# Patient Record
Sex: Female | Born: 1995 | Race: White | Hispanic: No | Smoking: Never smoker
Health system: Southern US, Community
[De-identification: ages and names within clinical notes are randomized; demographics above are authoritative.]

## PROBLEM LIST (undated history)

## (undated) DIAGNOSIS — I10 Essential (primary) hypertension: Secondary | ICD-10-CM

## (undated) DIAGNOSIS — G43711 Chronic migraine without aura, intractable, with status migrainosus: Secondary | ICD-10-CM

## (undated) HISTORY — PX: NO PAST SURGERIES: SHX2092

## (undated) HISTORY — DX: Chronic migraine without aura, intractable, with status migrainosus: G43.711

---
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
## 2017-03-27 ENCOUNTER — Encounter: Payer: Self-pay | Admitting: Physician Assistant

## 2017-03-27 ENCOUNTER — Ambulatory Visit (INDEPENDENT_AMBULATORY_CARE_PROVIDER_SITE_OTHER): Payer: Medicaid Other | Admitting: Physician Assistant

## 2017-03-27 VITALS — BP 138/88 | HR 84 | Temp 98.4°F | Resp 16 | Ht 70.0 in | Wt 210.0 lb

## 2017-03-27 DIAGNOSIS — Z7689 Persons encountering health services in other specified circumstances: Secondary | ICD-10-CM

## 2017-03-27 DIAGNOSIS — R519 Headache, unspecified: Secondary | ICD-10-CM

## 2017-03-27 DIAGNOSIS — Z23 Encounter for immunization: Secondary | ICD-10-CM

## 2017-03-27 DIAGNOSIS — Z3009 Encounter for other general counseling and advice on contraception: Secondary | ICD-10-CM

## 2017-03-27 DIAGNOSIS — R51 Headache: Secondary | ICD-10-CM

## 2017-03-27 DIAGNOSIS — R030 Elevated blood-pressure reading, without diagnosis of hypertension: Secondary | ICD-10-CM | POA: Diagnosis not present

## 2017-03-27 NOTE — Progress Notes (Addendum)
Patient: Brenda Osborn Female    DOB: [birth year]   21 y.o.   MRN: [id number] Visit Date: 03/27/2017  Today's Provider: Trinna Post, PA-C   Chief Complaint  Patient presents with  . Establish Care  . Headache    Happens about 5-7 days a week.  . Hypertension    Was on Lisinopril in the past.   Subjective:     Brenda Osborn is a 21 y/o woman presenting today to establish care. She was previously seen at [facility] by Dr. Jaynie Crumble.   She is currently at [place]. She lives with her mom, step dad, and two sisters in [place]. Her mother is borderline DM. No contact with birth father.  History of breast cancer in great aunt at age 34's-40's. No family history of colon cancer.   She is single. She is sexually active with single female partner. Uses condoms. Is on sprintec for contraception, has been since age 24. No history of DVT, stroke. Does not smoke. Does not have migraines with auras.  She has a history of hypertension. She was started on Lisinopril by her pediatrician but this dropped her BP too low and she was taken off. She was told it possibly could be her birth control, but at that point she was too old for the pediatric clinic and thus transferred care. She said she has had blood work done but no imaging up until this point.  Also reporting headaches. Headaches tend to happen at the end of the day. Can range from tensions type to migrainous throbbing with nausea, photophobia and phonophobia. No vomiting. No aura.   Due for HPV today.  Headache   This is a chronic problem. The current episode started more than 1 year ago. Episode frequency: About every 5-7 days. The problem has been gradually worsening. The pain is located in the left unilateral region. The pain does not radiate. The pain quality is similar to prior headaches. The quality of the pain is described as pulsating. Associated symptoms include back pain. Pertinent negatives include  no abdominal pain, abnormal behavior, anorexia, blurred vision, coughing, dizziness, drainage, ear pain, eye pain, eye redness, eye watering, facial sweating, fever, hearing loss, insomnia, loss of balance, muscle aches, nausea, neck pain, numbness, phonophobia, photophobia, rhinorrhea, scalp tenderness, seizures, sinus pressure, sore throat, swollen glands, tingling, tinnitus, visual change, vomiting, weakness or weight loss. Her past medical history is significant for hypertension.  Hypertension  This is a chronic (Started in December 2017.) problem. Associated symptoms include headaches. Pertinent negatives include no anxiety, blurred vision, chest pain, neck pain, orthopnea, palpitations, peripheral edema, PND, shortness of breath or sweats. Past treatments include ACE inhibitors. Improvement on treatment: Pt was on Lisinopril but had to stop secondary to dropping too low. Compliance problems include medication side effects.       Not on File   Current Outpatient Prescriptions:  .  SPRINTEC 28 0.25-35 MG-MCG tablet, Take 1 tablet by mouth daily., Disp: , Rfl: 5  Review of Systems  Constitutional: Negative.  Negative for fever and weight loss.  HENT: Negative.  Negative for ear pain, hearing loss, rhinorrhea, sinus pressure, sore throat and tinnitus.   Eyes: Negative.  Negative for blurred vision, photophobia, pain and redness.  Respiratory: Negative.  Negative for cough and shortness of breath.   Cardiovascular: Negative.  Negative for chest pain, palpitations, orthopnea and PND.  Gastrointestinal: Negative.  Negative for abdominal pain, anorexia, nausea and vomiting.  Endocrine: Negative.   Genitourinary: Negative.   Musculoskeletal: Positive for back pain. Negative for arthralgias, gait problem, joint swelling, myalgias, neck pain and neck stiffness.  Skin: Negative.   Allergic/Immunologic: Negative.   Neurological: Positive for headaches. Negative for dizziness, tingling, seizures,  weakness, numbness and loss of balance.  Hematological: Negative.   Psychiatric/Behavioral: Negative.  The patient does not have insomnia.     Social History  Substance Use Topics  . Smoking status: Never Smoker  . Smokeless tobacco: Never Used  . Alcohol use No   Objective:   BP 138/88 (BP Location: Left Arm, Patient Position: Sitting, Cuff Size: Large)   Pulse 84   Temp 98.4 F (36.9 C) (Oral)   Resp 16   Ht 5\' 10"  (1.778 m)   Wt 210 lb (95.3 kg)   LMP 03/19/2017   BMI 30.13 kg/m  Vitals:   03/27/17 1518  BP: 138/88  Pulse: 84  Resp: 16  Temp: 98.4 F (36.9 C)  TempSrc: Oral  Weight: 210 lb (95.3 kg)  Height: 5\' 10"  (1.778 m)     Physical Exam  Constitutional: She is oriented to person, place, and time. She appears well-nourished.  HENT:  Right Ear: Tympanic membrane and external ear normal.  Left Ear: Tympanic membrane and external ear normal.  Mouth/Throat: Oropharynx is clear and moist. No oropharyngeal exudate.  Neck: Neck supple. No thyromegaly present.  Cardiovascular: Normal rate and regular rhythm.   Pulmonary/Chest: Effort normal and breath sounds normal.  Abdominal: Soft. Bowel sounds are normal.  Musculoskeletal: Normal range of motion. She exhibits no edema, tenderness or deformity.       Right shoulder: Normal.       Left shoulder: Normal.       Right elbow: Normal.      Left elbow: Normal.       Right hip: Normal.       Left hip: Normal.       Right knee: Normal.       Left knee: Normal.       Right ankle: Normal.       Left ankle: Normal.       Cervical back: Normal.       Thoracic back: Normal.       Lumbar back: Normal.       Right foot: Normal.       Left foot: Normal.  Lymphadenopathy:    She has no cervical adenopathy.  Neurological: She is alert and oriented to person, place, and time.  Reflex Scores:      Bicep reflexes are 2+ on the right side and 2+ on the left side.      Patellar reflexes are 2+ on the right side and 2+ on  the left side.      Achilles reflexes are 2+ on the right side and 2+ on the left side. Skin: Skin is warm and dry.  Psychiatric: She has a normal mood and affect. Her behavior is normal.        Assessment & Plan:     1. Encounter to establish care  Will have her sign ROI to get records from prior PCP.  2. Need for HPV vaccination  - HPV 9-valent vaccine,Recombinat  3. Elevated BP without diagnosis of hypertension  Treated with Lisinopril before. Has never tried trial of stopping estrogen containing OCP. Will try this today. Offered alternative birth control such as mini pill. Patient defers at this point and will use condoms. Will have her  come back in one month for BP recheck.  4. Birth control counseling  See above.  5. Headache  Counseled patient to start headache diary. Attempt caffeine eliminations and limiting screen time. Push fluids, limit fast food and processed food. Return one month to check H/A diary. Can use tylenol for acute pain.  Return in about 1 month (around 04/26/2017) for headache diary, BP check.  The entirety of the information documented in the History of Present Illness, Review of Systems and Physical Exam were personally obtained by me. Portions of this information were initially documented by Ashley Royalty, CMA and reviewed by me for thoroughness and accuracy.        Trinna Post, PA-C  [facility]

## 2017-03-27 NOTE — Patient Instructions (Addendum)
Please discontinue your birth control pill and use condoms at every sexual encounter. We will check BP at end of one month. Also please try to keep headache diary about when your headaches occur, how long, characteristics, and recent activities surrounding headaches. We've also asked you to sign a release of information to get records from you previous provider.  General Headache Without Cause A headache is pain or discomfort felt around the head or neck area. There are many causes and types of headaches. In some cases, the cause may not be found. Follow these instructions at home: Managing pain  Take over-the-counter and prescription medicines only as told by your doctor.  Lie down in a dark, quiet room when you have a headache.  If directed, apply ice to the head and neck area: ? Put ice in a plastic bag. ? Place a towel between your skin and the bag. ? Leave the ice on for 20 minutes, 2-3 times per day.  Use a heating pad or hot shower to apply heat to the head and neck area as told by your doctor.  Keep lights dim if bright lights bother you or make your headaches worse. Eating and drinking  Eat meals on a regular schedule.  Lessen how much alcohol you drink.  Lessen how much caffeine you drink, or stop drinking caffeine. General instructions  Keep all follow-up visits as told by your doctor. This is important.  Keep a journal to find out if certain things bring on headaches. For example, write down: ? What you eat and drink. ? How much sleep you get. ? Any change to your diet or medicines.  Relax by getting a massage or doing other relaxing activities.  Lessen stress.  Sit up straight. Do not tighten (tense) your muscles.  Do not use tobacco products. This includes cigarettes, chewing tobacco, or e-cigarettes. If you need help quitting, ask your doctor.  Exercise regularly as told by your doctor.  Get enough sleep. This often means 7-9 hours of sleep. Contact a  doctor if:  Your symptoms are not helped by medicine.  You have a headache that feels different than the other headaches.  You feel sick to your stomach (nauseous) or you throw up (vomit).  You have a fever. Get help right away if:  Your headache becomes really bad.  You keep throwing up.  You have a stiff neck.  You have trouble seeing.  You have trouble speaking.  You have pain in the eye or ear.  Your muscles are weak or you lose muscle control.  You lose your balance or have trouble walking.  You feel like you will pass out (faint) or you pass out.  You have confusion. This information is not intended to replace advice given to you by your health care provider. Make sure you discuss any questions you have with your health care provider. Document Released: 06/25/2008 Document Revised: 02/22/2016 Document Reviewed: 01/09/2015 Elsevier Interactive Patient Education  [employer].

## 2017-03-28 DIAGNOSIS — R519 Headache, unspecified: Secondary | ICD-10-CM | POA: Insufficient documentation

## 2017-03-28 DIAGNOSIS — R51 Headache: Secondary | ICD-10-CM

## 2017-03-28 DIAGNOSIS — R030 Elevated blood-pressure reading, without diagnosis of hypertension: Secondary | ICD-10-CM | POA: Insufficient documentation

## 2017-04-03 ENCOUNTER — Encounter: Payer: Self-pay | Admitting: Physician Assistant

## 2017-04-04 ENCOUNTER — Ambulatory Visit: Payer: Medicaid Other | Admitting: Physician Assistant

## 2017-04-23 ENCOUNTER — Encounter: Payer: Self-pay | Admitting: Physician Assistant

## 2017-04-23 ENCOUNTER — Ambulatory Visit (INDEPENDENT_AMBULATORY_CARE_PROVIDER_SITE_OTHER): Payer: Medicaid Other | Admitting: Physician Assistant

## 2017-04-23 VITALS — BP 122/66 | HR 68 | Temp 98.4°F | Resp 16 | Wt 211.0 lb

## 2017-04-23 DIAGNOSIS — R51 Headache: Secondary | ICD-10-CM

## 2017-04-23 DIAGNOSIS — R519 Headache, unspecified: Secondary | ICD-10-CM

## 2017-04-23 DIAGNOSIS — Z3009 Encounter for other general counseling and advice on contraception: Secondary | ICD-10-CM

## 2017-04-23 MED ORDER — NORETHINDRONE 0.35 MG PO TABS: 1.0000 | ORAL_TABLET | Freq: Every day | ORAL | 11 refills | Status: DC

## 2017-04-23 NOTE — Patient Instructions (Signed)

## 2017-04-23 NOTE — Progress Notes (Signed)
Patient: Brenda Osborn Female    DOB: [birth year]   21 y.o.   MRN: [id number] Visit Date: 04/23/2017  Today's Provider: Trinna Post, PA-C   Chief Complaint  Patient presents with  . Hypertension    Follow up  . Headache   Subjective:      Brenda Osborn is a 21 y/o woman with history of elevated blood pressure and headache presenting today for follow up. She was seen in clinic one month ago. At that time her BP was 138/88. She had been on Tri-Sprintec since she was 13. She had previous trial of Lisinopril 10 mg which dropped her blood pressure very low so that she was dizzy and otherwise symptomatic. She was discontinued off her blood pressure at that point and referred out of pediatrics. We discontinued her birth control at the last visit.  Today her blood pressure is 122/66. Patient feels much improved in regards to her headaches. She has not been sexually active since discontinuing off her birth control.   Hypertension  Associated symptoms include malaise/fatigue. Pertinent negatives include no anxiety, blurred vision, chest pain, headaches, neck pain, orthopnea, palpitations, peripheral edema, PND, shortness of breath or sweats. Agents associated with hypertension include oral contraceptives.  Headache   The problem has been gradually improving. The pain quality is similar to prior headaches. The patient is experiencing no pain. Pertinent negatives include no blurred vision, dizziness, fever or neck pain. Her past medical history is significant for hypertension.       Allergies  Allergen Reactions  . Zithromax [Azithromycin]     Respiratory Destress    No current outpatient prescriptions on file.  Review of Systems  Constitutional: Positive for fatigue (Pt is concern that her iron is low.) and malaise/fatigue. Negative for activity change, appetite change, chills, diaphoresis, fever and unexpected weight change.  Eyes: Negative for blurred vision.    Respiratory: Negative.  Negative for shortness of breath.   Cardiovascular: Negative.  Negative for chest pain, palpitations, orthopnea and PND.  Gastrointestinal: Negative.   Musculoskeletal: Negative for neck pain.  Neurological: Negative for dizziness, light-headedness and headaches.    Social History  Substance Use Topics  . Smoking status: Never Smoker  . Smokeless tobacco: Never Used  . Alcohol use No   Objective:   BP 122/66 (BP Location: Left Arm, Patient Position: Sitting, Cuff Size: Large)   Pulse 68   Temp 98.4 F (36.9 C) (Oral)   Resp 16   Wt 211 lb (95.7 kg)   LMP 03/31/2017   BMI 30.28 kg/m  Vitals:   04/23/17 0805  BP: 122/66  Pulse: 68  Resp: 16  Temp: 98.4 F (36.9 C)  TempSrc: Oral  Weight: 211 lb (95.7 kg)     Physical Exam  Constitutional: She appears well-developed and well-nourished.  Cardiovascular: Normal rate and regular rhythm.   Pulmonary/Chest: Effort normal and breath sounds normal.  Skin: Skin is warm and dry.  Psychiatric: She has a normal mood and affect. Her behavior is normal.        Assessment & Plan:     1. Nonintractable headache, unspecified chronicity pattern, unspecified headache type  Improved with better BP.  2. Encounter for counseling regarding contraception  Good success off estrogen containing OCP. Advised that she should avoid combo OCPs. Patient would like to pursue IUD. Until then, will start [place]. Can start today, backup protection x 2 weeks continuous pill usage. Refer to OBGYN.  - norethindrone (  MICRONOR,CAMILA,ERRIN) 0.35 MG tablet; Take 1 tablet (0.35 mg total) by mouth daily.  Dispense: 1 Package; Refill: 11 - Ambulatory referral to Obstetrics / Gynecology  Return in about 1 year (around 04/23/2018) for CPE.  The entirety of the information documented in the History of Present Illness, Review of Systems and Physical Exam were personally obtained by me. Portions of this information were initially  documented by Ashley Royalty, CMA and reviewed by me for thoroughness and accuracy.        Trinna Post, PA-C  [facility]

## 2017-05-01 ENCOUNTER — Encounter: Payer: Self-pay | Admitting: Certified Nurse Midwife

## 2017-05-01 ENCOUNTER — Ambulatory Visit (INDEPENDENT_AMBULATORY_CARE_PROVIDER_SITE_OTHER): Payer: Medicaid Other | Admitting: Certified Nurse Midwife

## 2017-05-01 VITALS — BP 120/89 | HR 90 | Ht 68.0 in | Wt 212.0 lb

## 2017-05-01 DIAGNOSIS — Z3009 Encounter for other general counseling and advice on contraception: Secondary | ICD-10-CM | POA: Diagnosis not present

## 2017-05-01 MED ORDER — IBUPROFEN 800 MG PO TABS: 800.0000 mg | ORAL_TABLET | Freq: Three times a day (TID) | ORAL | 1 refills | Status: DC | PRN

## 2017-05-01 MED ORDER — MISOPROSTOL 200 MCG PO TABS: ORAL_TABLET | ORAL | 2 refills | Status: DC

## 2017-05-01 NOTE — Patient Instructions (Signed)
Intrauterine Device Insertion, Care After This sheet gives you information about how to care for yourself after your procedure. Your health care provider may also give you more specific instructions. If you have problems or questions, contact your health care provider. What can I expect after the procedure? After the procedure, it is common to have:  Cramps and pain in the abdomen.  Light bleeding (spotting) or heavier bleeding that is like your menstrual period. This may last for up to a few days.  Lower back pain.  Dizziness.  Headaches.  Nausea.  Follow these instructions at home:  Before resuming sexual activity, check to make sure that you can feel the IUD string(s). You should be able to feel the end of the string(s) below the opening of your cervix. If your IUD string is in place, you may resume sexual activity. ? If you had a hormonal IUD inserted more than 7 days after your most recent period started, you will need to use a backup method of birth control for 7 days after IUD insertion. Ask your health care provider whether this applies to you.  Continue to check that the IUD is still in place by feeling for the string(s) after every menstrual period, or once a month.  Take over-the-counter and prescription medicines only as told by your health care provider.  Do not drive or use heavy machinery while taking prescription pain medicine.  Keep all follow-up visits as told by your health care provider. This is important. Contact a health care provider if:  You have bleeding that is heavier or lasts longer than a normal menstrual cycle.  You have a fever.  You have cramps or abdominal pain that get worse or do not get better with medicine.  You develop abdominal pain that is new or is not in the same area of earlier cramping and pain.  You feel lightheaded or weak.  You have abnormal or bad-smelling discharge from your vagina.  You have pain during sexual  activity.  You have any of the following problems with your IUD string(s): ? The string bothers or hurts you or your sexual partner. ? You cannot feel the string. ? The string has gotten longer.  You can feel the IUD in your vagina.  You think you may be pregnant, or you miss your menstrual period.  You think you may have an STI (sexually transmitted infection). Get help right away if:  You have flu-like symptoms.  You have a fever and chills.  You can feel that your IUD has slipped out of place. Summary  After the procedure, it is common to have cramps and pain in the abdomen. It is also common to have light bleeding (spotting) or heavier bleeding that is like your menstrual period.  Continue to check that the IUD is still in place by feeling for the string(s) after every menstrual period, or once a month.  Keep all follow-up visits as told by your health care provider. This is important.  Contact your health care provider if you have problems with your IUD string(s), such as the string getting longer or bothering you or your sexual partner. This information is not intended to replace advice given to you by your health care provider. Make sure you discuss any questions you have with your health care provider. Document Released: 05/15/2011 Document Revised: 08/07/2016 Document Reviewed: 08/07/2016 Elsevier Interactive Patient Education  2017 [employer]. Intrauterine Device Insertion An intrauterine device (IUD) is a medical device that gets  inserted into the uterus to prevent pregnancy. It is a small, T-shaped device that has one or two nylon strings hanging down from it. The strings hang out of the lower part of the uterus (cervix) to allow for future IUD removal. There are two types of IUDs available:  Copper IUD. This type of IUD has copper wire wrapped around it. Copper makes the uterus and fallopian tubes produce a fluid that kills sperm. A copper IUD may last up to 10  years.  Hormone IUD. This type of IUD is made of plastic and contains the hormone progestin (synthetic progesterone). The hormone thickens mucus in the cervix and prevents sperm from entering the uterus. It also thins the uterine lining to prevent implantation of a fertilized egg. The hormone can weaken or kill the sperm that get into the uterus. A hormone IUD may last 3-5 years.  Tell a health care provider about:  Any allergies you have.  All medicines you are taking, including vitamins, herbs, eye drops, creams, and over-the-counter medicines.  Any problems you or family members have had with anesthetic medicines.  Any blood disorders you have.  Any surgeries you have had.  Any medical conditions you have, including any STIs (sexually transmitted infections) you may have.  Whether you are pregnant or may be pregnant. What are the risks? Generally, this is a safe procedure. However, problems may occur, including:  Infection.  Bleeding.  Allergic reactions to medicines.  Accidental puncture (perforation) of the uterus, or damage to other structures or organs.  Accidental placement of the IUD either in the muscle layer of the uterus (myometrium) or outside the uterus.  The IUD falling out of the uterus (expulsion). This is more common among women who have recently had a child.  Pregnancy that happens in the fallopian tube (ectopic pregnancy).  Infection of the uterus and fallopian tubes (pelvic inflammatory disease).  What happens before the procedure?  Schedule the IUD insertion for when you will have your menstrual period or right after, to make sure you are not pregnant. Placement of the IUD is better tolerated shortly after a menstrual cycle.  Follow instructions from your health care provider about eating or drinking restrictions.  Ask your health care provider about changing or stopping your regular medicines. This is especially important if you are taking diabetes  medicines or blood thinners.  You may get a pain reliever to take before the procedure.  You may have tests for: ? Pregnancy. A pregnancy test involves having a urine sample taken. ? STIs. Placing an IUD in someone who has an STI can make the infection worse. ? Cervical cancer. You may have a Pap test to check for this type of cancer. This means collecting cells from your cervix to be examined under a microscope.  You may have a physical exam to determine the size and position of your uterus. The procedure may vary among health care providers and hospitals. What happens during the procedure?  A tool (speculum) will be placed in your vagina and widened so that your health care provider can see your cervix.  Medicine may be applied to your cervix to help lower your risk of infection (antiseptic medicine).  You may be given an anesthetic medicine to numb each side of your cervix (intracervical block or paracervical block). This medicine is usually given by an injection into the cervix.  A tool (uterine sound) will be inserted into your uterus to determine the length of your uterus and the  direction that your uterus may be tilted.  A slim instrument or tube (IUD inserter) that holds the IUD will be inserted into your vagina, through your cervical canal, and into your uterus.  The IUD will be placed in the uterus, and the IUD inserter will be removed.  The strings that are attached to the IUD will be trimmed so that they lie just below the cervix. The procedure may vary among health care providers and hospitals. What happens after the procedure?  You may have bleeding after the procedure. This is normal. It varies from light bleeding (spotting) for a few days to menstrual-like bleeding.  You may have cramping and pain.  You may feel dizzy or light-headed.  You may have lower back pain. Summary  An intrauterine device (IUD) is a small, T-shaped device that has one or two nylon strings  hanging down from it.  Two types of IUDs are available. You may have a copper IUD or a hormone IUD.  Schedule the IUD insertion for when you will have your menstrual period or right after, to make sure you are not pregnant. Placement of the IUD is better tolerated shortly after a menstrual cycle.  You may have bleeding after the procedure. This is normal. It varies from light spotting for a few days to menstrual-like bleeding. This information is not intended to replace advice given to you by your health care provider. Make sure you discuss any questions you have with your health care provider. Document Released: 05/15/2011 Document Revised: 08/07/2016 Document Reviewed: 08/07/2016 Elsevier Interactive Patient Education  2017 [employer]. Intrauterine Device Information An intrauterine device (IUD) is inserted into your uterus to prevent pregnancy. There are two types of IUDs available:  Copper IUD-This type of IUD is wrapped in copper wire and is placed inside the uterus. Copper makes the uterus and fallopian tubes produce a fluid that kills sperm. The copper IUD can stay in place for 10 years.  Hormone IUD-This type of IUD contains the hormone progestin (synthetic progesterone). The hormone thickens the cervical mucus and prevents sperm from entering the uterus. It also thins the uterine lining to prevent implantation of a fertilized egg. The hormone can weaken or kill the sperm that get into the uterus. One type of hormone IUD can stay in place for 5 years, and another type can stay in place for 3 years.  Your health care provider will make sure you are a good candidate for a contraceptive IUD. Discuss with your health care provider the possible side effects. Advantages of an intrauterine device  IUDs are highly effective, reversible, long acting, and low maintenance.  There are no estrogen-related side effects.  An IUD can be used when breastfeeding.  IUDs are not associated with  weight gain.  The copper IUD works immediately after insertion.  The hormone IUD works right away if inserted within 7 days of your period starting. You will need to use a backup method of birth control for 7 days if the hormone IUD is inserted at any other time in your cycle.  The copper IUD does not interfere with your female hormones.  The hormone IUD can make heavy menstrual periods lighter and decrease cramping.  The hormone IUD can be used for 3 or 5 years.  The copper IUD can be used for 10 years. Disadvantages of an intrauterine device  The hormone IUD can be associated with irregular bleeding patterns.  The copper IUD can make your menstrual flow heavier and more  painful.  You may experience cramping and vaginal bleeding after insertion. This information is not intended to replace advice given to you by your health care provider. Make sure you discuss any questions you have with your health care provider. Document Released: 08/20/2004 Document Revised: 02/22/2016 Document Reviewed: 03/07/2013 Elsevier Interactive Patient Education  2017 [employer].

## 2017-05-02 ENCOUNTER — Encounter: Payer: Self-pay | Admitting: Certified Nurse Midwife

## 2017-05-02 ENCOUNTER — Ambulatory Visit (INDEPENDENT_AMBULATORY_CARE_PROVIDER_SITE_OTHER): Payer: Medicaid Other | Admitting: Certified Nurse Midwife

## 2017-05-02 VITALS — BP 127/97 | HR 78 | Ht 68.0 in | Wt 213.5 lb

## 2017-05-02 DIAGNOSIS — Z30430 Encounter for insertion of intrauterine contraceptive device: Secondary | ICD-10-CM | POA: Diagnosis not present

## 2017-05-02 NOTE — Progress Notes (Signed)
Brenda Osborn is a 21 y.o. year old [id number] Caucasian female who presents for placement of a Mirena IUD.  Patient's last menstrual period was 04/30/2017 (exact date). BP (!) 127/97 (BP Location: Left Arm, Patient Position: Sitting, Cuff Size: Normal)   Pulse 78   Ht 5\' 8"  (1.727 m)   Wt 213 lb 8 oz (96.8 kg)   LMP 04/30/2017 (Exact Date)   BMI 32.46 kg/m    The risks and benefits of the method and placement have been thouroughly reviewed with the patient and all questions were answered. Specifically the patient is aware of failure rate of 09/998, expulsion of the IUD and of possible perforation. The patient is aware of irregular bleeding due to the method and understands the incidence of irregular bleeding diminishes with time.  Signed copy of informed consent in chart.   Time out was performed.  A small plastic speculum was placed in the vagina.  The cervix was visualized, prepped using Betadine, and grasped with a single tooth tenaculum. The uterus was found to be anteroflexed and it sounded to 6 cm after cervical dilation.  Mirena IUD placed per manufacturer's recommendations.   The strings were trimmed to 3 cm.  The patient was given post procedure instructions, including signs and symptoms of infection and to check for the strings after each menses or each month, and refraining from intercourse or anything in the vagina for 3 days.  She was given a Mirena care card with date Mirena placed, and date Mirena to be removed.    Diona Fanti, CNM   Lot: [id number] Exp: 10/2019

## 2017-05-02 NOTE — Patient Instructions (Signed)
Intrauterine Device Insertion, Care After This sheet gives you information about how to care for yourself after your procedure. Your health care provider may also give you more specific instructions. If you have problems or questions, contact your health care provider. What can I expect after the procedure? After the procedure, it is common to have:  Cramps and pain in the abdomen.  Light bleeding (spotting) or heavier bleeding that is like your menstrual period. This may last for up to a few days.  Lower back pain.  Dizziness.  Headaches.  Nausea.  Follow these instructions at home:  Before resuming sexual activity, check to make sure that you can feel the IUD string(s). You should be able to feel the end of the string(s) below the opening of your cervix. If your IUD string is in place, you may resume sexual activity. ? If you had a hormonal IUD inserted more than 7 days after your most recent period started, you will need to use a backup method of birth control for 7 days after IUD insertion. Ask your health care provider whether this applies to you.  Continue to check that the IUD is still in place by feeling for the string(s) after every menstrual period, or once a month.  Take over-the-counter and prescription medicines only as told by your health care provider.  Do not drive or use heavy machinery while taking prescription pain medicine.  Keep all follow-up visits as told by your health care provider. This is important. Contact a health care provider if:  You have bleeding that is heavier or lasts longer than a normal menstrual cycle.  You have a fever.  You have cramps or abdominal pain that get worse or do not get better with medicine.  You develop abdominal pain that is new or is not in the same area of earlier cramping and pain.  You feel lightheaded or weak.  You have abnormal or bad-smelling discharge from your vagina.  You have pain during sexual  activity.  You have any of the following problems with your IUD string(s): ? The string bothers or hurts you or your sexual partner. ? You cannot feel the string. ? The string has gotten longer.  You can feel the IUD in your vagina.  You think you may be pregnant, or you miss your menstrual period.  You think you may have an STI (sexually transmitted infection). Get help right away if:  You have flu-like symptoms.  You have a fever and chills.  You can feel that your IUD has slipped out of place. Summary  After the procedure, it is common to have cramps and pain in the abdomen. It is also common to have light bleeding (spotting) or heavier bleeding that is like your menstrual period.  Continue to check that the IUD is still in place by feeling for the string(s) after every menstrual period, or once a month.  Keep all follow-up visits as told by your health care provider. This is important.  Contact your health care provider if you have problems with your IUD string(s), such as the string getting longer or bothering you or your sexual partner. This information is not intended to replace advice given to you by your health care provider. Make sure you discuss any questions you have with your health care provider. Document Released: 05/15/2011 Document Revised: 08/07/2016 Document Reviewed: 08/07/2016 Elsevier Interactive Patient Education  2017 [employer] Levonorgestrel intrauterine device (IUD) What is this medicine? LEVONORGESTREL IUD (LEE voe nor   jes trel) is a contraceptive (birth control) device. The device is placed inside the uterus by a healthcare professional. It is used to prevent pregnancy. This device can also be used to treat heavy bleeding that occurs during your period. This medicine may be used for other purposes; ask your health care provider or pharmacist if you have questions. COMMON BRAND NAME(S): Kyleena, LILETTA, Mirena, Skyla What should I tell my health  care provider before I take this medicine? They need to know if you have any of these conditions: -abnormal Pap smear -cancer of the breast, uterus, or cervix -diabetes -endometritis -genital or pelvic infection now or in the past -have more than one sexual partner or your partner has more than one partner -heart disease -history of an ectopic or tubal pregnancy -immune system problems -IUD in place -liver disease or tumor -problems with blood clots or take blood-thinners -seizures -use intravenous drugs -uterus of unusual shape -vaginal bleeding that has not been explained -an unusual or allergic reaction to levonorgestrel, other hormones, silicone, or polyethylene, medicines, foods, dyes, or preservatives -pregnant or trying to get pregnant -breast-feeding How should I use this medicine? This device is placed inside the uterus by a health care professional. Talk to your pediatrician regarding the use of this medicine in children. Special care may be needed. Overdosage: If you think you have taken too much of this medicine contact a poison control center or emergency room at once. NOTE: This medicine is only for you. Do not share this medicine with others. What if I miss a dose? This does not apply. Depending on the brand of device you have inserted, the device will need to be replaced every 3 to 5 years if you wish to continue using this type of birth control. What may interact with this medicine? Do not take this medicine with any of the following medications: -amprenavir -bosentan -fosamprenavir This medicine may also interact with the following medications: -aprepitant -armodafinil -barbiturate medicines for inducing sleep or treating seizures -bexarotene -boceprevir -griseofulvin -medicines to treat seizures like carbamazepine, ethotoin, felbamate, oxcarbazepine, phenytoin, topiramate -modafinil -pioglitazone -rifabutin -rifampin -rifapentine -some medicines to  treat HIV infection like atazanavir, efavirenz, indinavir, lopinavir, nelfinavir, tipranavir, ritonavir -St. John's wort -warfarin This list may not describe all possible interactions. Give your health care provider a list of all the medicines, herbs, non-prescription drugs, or dietary supplements you use. Also tell them if you smoke, drink alcohol, or use illegal drugs. Some items may interact with your medicine. What should I watch for while using this medicine? Visit your doctor or health care professional for regular check ups. See your doctor if you or your partner has sexual contact with others, becomes HIV positive, or gets a sexual transmitted disease. This product does not protect you against HIV infection (AIDS) or other sexually transmitted diseases. You can check the placement of the IUD yourself by reaching up to the top of your vagina with clean fingers to feel the threads. Do not pull on the threads. It is a good habit to check placement after each menstrual period. Call your doctor right away if you feel more of the IUD than just the threads or if you cannot feel the threads at all. The IUD may come out by itself. You may become pregnant if the device comes out. If you notice that the IUD has come out use a backup birth control method like condoms and call your health care provider. Using tampons will not change the position of the   IUD and are okay to use during your period. This IUD can be safely scanned with magnetic resonance imaging (MRI) only under specific conditions. Before you have an MRI, tell your healthcare provider that you have an IUD in place, and which type of IUD you have in place. What side effects may I notice from receiving this medicine? Side effects that you should report to your doctor or health care professional as soon as possible: -allergic reactions like skin rash, itching or hives, swelling of the face, lips, or tongue -fever, flu-like symptoms -genital  sores -high blood pressure -no menstrual period for 6 weeks during use -pain, swelling, warmth in the leg -pelvic pain or tenderness -severe or sudden headache -signs of pregnancy -stomach cramping -sudden shortness of breath -trouble with balance, talking, or walking -unusual vaginal bleeding, discharge -yellowing of the eyes or skin Side effects that usually do not require medical attention (report to your doctor or health care professional if they continue or are bothersome): -acne -breast pain -change in sex drive or performance -changes in weight -cramping, dizziness, or faintness while the device is being inserted -headache -irregular menstrual bleeding within first 3 to 6 months of use -nausea This list may not describe all possible side effects. Call your doctor for medical advice about side effects. You may report side effects to FDA at 1-800-FDA-1088. Where should I keep my medicine? This does not apply. NOTE: This sheet is a summary. It may not cover all possible information. If you have questions about this medicine, talk to your doctor, pharmacist, or health care provider.  2018 Elsevier/[employer] Standard (2016-06-28 14:14:56)  

## 2017-05-05 NOTE — Progress Notes (Signed)
GYN ENCOUNTER NOTE  Subjective:       Brenda Osborn is a 21 y.o. G0P0000 female here for contraception counseling.   Her goals are to prevent pregnancy and decrease her periods.   Denies difficulty breathing or respiratory distress, chest pain, abdominal pain, excessive vaginal bleeding, and leg pain or swelling.   History significant for headaches without visual changes and hypertension.   Menstrual History  Period Duration (Days): Five (5) to Six (6) Period Pattern: Regular Menstrual Flow: Heavy Menstrual Control: Tampon Menstrual Control Change Freq (Hours): Two (2) to Four (4)  Dysmenorrhea: (!) Moderate Dysmenorrhea Symptoms: Cramping    Gynecologic History  Patient's last menstrual period was 04/30/2017 (exact date).   Contraception: oral progesterone-only contraceptive  Last Pap: N/A.   Obstetric History  OB History  Gravida Para Term Preterm AB Living  0 0 0 0 0 0  SAB TAB Ectopic Multiple Live Births  0 0 0 0 0        History reviewed. No pertinent past medical history.  Past Surgical History:  Procedure Laterality Date  . NO PAST SURGERIES      Allergies  Allergen Reactions  . Zithromax [Azithromycin]     Respiratory Destress    Social History   Social History  . Marital status: Single    Spouse name: N/A  . Number of children: N/A  . Years of education: N/A   Occupational History  . Not on file.   Social History Main Topics  . Smoking status: Never Smoker  . Smokeless tobacco: Never Used  . Alcohol use No  . Drug use: No  . Sexual activity: Yes    Partners: Male    Birth control/ protection: IUD     Comment: Mirena 05/02/2017   Other Topics Concern  . Not on file   Social History Narrative  . No narrative on file    Family History  Problem Relation Age of Onset  . Diabetes Mother        "Pre Diabetes"  . Healthy Sister   . Hypertension Maternal Grandmother   . Healthy Sister     The following portions of the  patient's history were reviewed and updated as appropriate: allergies, current medications, past family history, past medical history, past social history, past surgical history and problem list.  Review of Systems  Review of Systems - Negative except as noted above.  History obtained from the patient.    Objective:   BP 120/89 Comment: left  Pulse 90   Ht 5\' 8"  (1.727 m)   Wt 212 lb (96.2 kg)   LMP 04/30/2017 (Exact Date)   BMI 32.23 kg/m    Alert and oriented x 4, no apparent distress.   Physical exam: not indicated.    Assessment:   1. Encounter for counseling regarding contraception  Plan:   Reviewed all forms of birth control options available including abstinence; over the counter/barrier methods; hormonal contraceptive medication including pill, patch, ring, injection,contraceptive implant; hormonal and nonhormonal IUDs; permanent sterilization options including vasectomy and the various tubal sterilization modalities. Risks and benefits reviewed.  Questions were answered.  Information was given to patient to review.   Rx: Motrin and Cytotec, see orders.   RTC later this week for IUD placement, if desired.    A total of 20 minutes were spent face-to-face with the patient during the encounter with greater than 50% dealing with counseling and coordination of care.   Diona Fanti, CNM

## 2017-05-06 ENCOUNTER — Telehealth: Payer: Self-pay | Admitting: Certified Nurse Midwife

## 2017-05-06 NOTE — Telephone Encounter (Signed)
The patient lvm stating that she had an IUD inserted, has a few questions in regards to the IUD, and that she would also like to speak with a nurse. The patient did not disclose any other information other than wanting a call back. Please advise.

## 2017-05-08 NOTE — Telephone Encounter (Signed)
LMTCO.

## 2017-05-13 ENCOUNTER — Encounter: Payer: Self-pay | Admitting: Certified Nurse Midwife

## 2017-05-13 NOTE — Telephone Encounter (Signed)
Left message that call not returned from pt last week and if she still has questions to either call or send my chart message.

## 2017-05-30 ENCOUNTER — Ambulatory Visit (INDEPENDENT_AMBULATORY_CARE_PROVIDER_SITE_OTHER): Payer: Medicaid Other | Admitting: Certified Nurse Midwife

## 2017-05-30 ENCOUNTER — Telehealth: Payer: Self-pay | Admitting: Certified Nurse Midwife

## 2017-05-30 VITALS — BP 134/92 | HR 88 | Ht 67.0 in | Wt 215.3 lb

## 2017-05-30 DIAGNOSIS — B9689 Other specified bacterial agents as the cause of diseases classified elsewhere: Secondary | ICD-10-CM

## 2017-05-30 DIAGNOSIS — N898 Other specified noninflammatory disorders of vagina: Secondary | ICD-10-CM | POA: Diagnosis not present

## 2017-05-30 DIAGNOSIS — R102 Pelvic and perineal pain: Secondary | ICD-10-CM

## 2017-05-30 DIAGNOSIS — Z30431 Encounter for routine checking of intrauterine contraceptive device: Secondary | ICD-10-CM | POA: Diagnosis not present

## 2017-05-30 DIAGNOSIS — N760 Acute vaginitis: Principal | ICD-10-CM

## 2017-05-30 MED ORDER — METRONIDAZOLE 500 MG PO TABS: 500.0000 mg | ORAL_TABLET | Freq: Two times a day (BID) | ORAL | 0 refills | Status: DC

## 2017-05-30 MED ORDER — METRONIDAZOLE 500 MG PO TABS: 500.0000 mg | ORAL_TABLET | Freq: Two times a day (BID) | ORAL | 0 refills | Status: AC

## 2017-05-30 NOTE — Patient Instructions (Addendum)
Pelvic Pain, Female Pelvic pain is pain in your lower abdomen, below your belly button and between your hips. The pain may start suddenly (acute), keep coming back (recurring), or last a long time (chronic). Pelvic pain that lasts longer than six months is considered chronic. Pelvic pain may affect your:  Reproductive organs.  Urinary system.  Digestive tract.  Musculoskeletal system.  There are many potential causes of pelvic pain. Sometimes, the pain can be a result of digestive or urinary conditions, strained muscles or ligaments, or even reproductive conditions. Sometimes the cause of pelvic pain is not known. Follow these instructions at home:  Take over-the-counter and prescription medicines only as told by your health care provider.  Rest as told by your health care provider.  Do not have sex it if hurts.  Keep a journal of your pelvic pain. Write down: ? When the pain started. ? Where the pain is located. ? What seems to make the pain better or worse, such as food or your menstrual cycle. ? Any symptoms you have along with the pain.  Keep all follow-up visits as told by your health care provider. This is important. Contact a health care provider if:  Medicine does not help your pain.  Your pain comes back.  You have new symptoms.  You have abnormal vaginal discharge or bleeding, including bleeding after menopause.  You have a fever or chills.  You are constipated.  You have blood in your urine or stool.  You have foul-smelling urine.  You feel weak or lightheaded. Get help right away if:  You have sudden severe pain.  Your pain gets steadily worse.  You have severe pain along with fever, nausea, vomiting, or excessive sweating.  You lose consciousness. This information is not intended to replace advice given to you by your health care provider. Make sure you discuss any questions you have with your health care provider. Document Released: 08/13/2004  Document Revised: 10/11/2015 Document Reviewed: 07/07/2015 Elsevier Interactive Patient Education  2018 [employer]. Levonorgestrel intrauterine device (IUD) What is this medicine? LEVONORGESTREL IUD (LEE voe nor jes trel) is a contraceptive (birth control) device. The device is placed inside the uterus by a healthcare professional. It is used to prevent pregnancy. This device can also be used to treat heavy bleeding that occurs during your period. This medicine may be used for other purposes; ask your health care provider or pharmacist if you have questions. COMMON BRAND NAME(S): Minette Headland What should I tell my health care provider before I take this medicine? They need to know if you have any of these conditions: -abnormal Pap smear -cancer of the breast, uterus, or cervix -diabetes -endometritis -genital or pelvic infection now or in the past -have more than one sexual partner or your partner has more than one partner -heart disease -history of an ectopic or tubal pregnancy -immune system problems -IUD in place -liver disease or tumor -problems with blood clots or take blood-thinners -seizures -use intravenous drugs -uterus of unusual shape -vaginal bleeding that has not been explained -an unusual or allergic reaction to levonorgestrel, other hormones, silicone, or polyethylene, medicines, foods, dyes, or preservatives -pregnant or trying to get pregnant -breast-feeding How should I use this medicine? This device is placed inside the uterus by a health care professional. Talk to your pediatrician regarding the use of this medicine in children. Special care may be needed. Overdosage: If you think you have taken too much of this medicine contact a poison control  center or emergency room at once. NOTE: This medicine is only for you. Do not share this medicine with others. What if I miss a dose? This does not apply. Depending on the brand of device you have  inserted, the device will need to be replaced every 3 to 5 years if you wish to continue using this type of birth control. What may interact with this medicine? Do not take this medicine with any of the following medications: -amprenavir -bosentan -fosamprenavir This medicine may also interact with the following medications: -aprepitant -armodafinil -barbiturate medicines for inducing sleep or treating seizures -bexarotene -boceprevir -griseofulvin -medicines to treat seizures like carbamazepine, ethotoin, felbamate, oxcarbazepine, phenytoin, topiramate -modafinil -pioglitazone -rifabutin -rifampin -rifapentine -some medicines to treat HIV infection like atazanavir, efavirenz, indinavir, lopinavir, nelfinavir, tipranavir, ritonavir -St. John's wort -warfarin This list may not describe all possible interactions. Give your health care provider a list of all the medicines, herbs, non-prescription drugs, or dietary supplements you use. Also tell them if you smoke, drink alcohol, or use illegal drugs. Some items may interact with your medicine. What should I watch for while using this medicine? Visit your doctor or health care professional for regular check ups. See your doctor if you or your partner has sexual contact with others, becomes HIV positive, or gets a sexual transmitted disease. This product does not protect you against HIV infection (AIDS) or other sexually transmitted diseases. You can check the placement of the IUD yourself by reaching up to the top of your vagina with clean fingers to feel the threads. Do not pull on the threads. It is a good habit to check placement after each menstrual period. Call your doctor right away if you feel more of the IUD than just the threads or if you cannot feel the threads at all. The IUD may come out by itself. You may become pregnant if the device comes out. If you notice that the IUD has come out use a backup birth control method like condoms  and call your health care provider. Using tampons will not change the position of the IUD and are okay to use during your period. This IUD can be safely scanned with magnetic resonance imaging (MRI) only under specific conditions. Before you have an MRI, tell your healthcare provider that you have an IUD in place, and which type of IUD you have in place. What side effects may I notice from receiving this medicine? Side effects that you should report to your doctor or health care professional as soon as possible: -allergic reactions like skin rash, itching or hives, swelling of the face, lips, or tongue -fever, flu-like symptoms -genital sores -high blood pressure -no menstrual period for 6 weeks during use -pain, swelling, warmth in the leg -pelvic pain or tenderness -severe or sudden headache -signs of pregnancy -stomach cramping -sudden shortness of breath -trouble with balance, talking, or walking -unusual vaginal bleeding, discharge -yellowing of the eyes or skin Side effects that usually do not require medical attention (report to your doctor or health care professional if they continue or are bothersome): -acne -breast pain -change in sex drive or performance -changes in weight -cramping, dizziness, or faintness while the device is being inserted -headache -irregular menstrual bleeding within first 3 to 6 months of use -nausea This list may not describe all possible side effects. Call your doctor for medical advice about side effects. You may report side effects to FDA at 1-800-FDA-1088. Where should I keep my medicine? This does not apply.  NOTE: This sheet is a summary. It may not cover all possible information. If you have questions about this medicine, talk to your doctor, pharmacist, or health care provider.  2018 Elsevier/Gold Standard (2016-06-28 14:14:56)  Bacterial Vaginosis Bacterial vaginosis is an infection of the vagina. It happens when too many germs (bacteria)  grow in the vagina. This infection puts you at risk for infections from sex (STIs). Treating this infection can lower your risk for some STIs. You should also treat this if you are pregnant. It can cause your baby to be born early. Follow these instructions at home: Medicines  Take over-the-counter and prescription medicines only as told by your doctor.  Take or use your antibiotic medicine as told by your doctor. Do not stop taking or using it even if you start to feel better. General instructions  If you your sexual partner is a woman, tell her that you have this infection. She needs to get treatment if she has symptoms. If you have a female partner, he does not need to be treated.  During treatment: ? Avoid sex. ? Do not douche. ? Avoid alcohol as told. ? Avoid breastfeeding as told.  Drink enough fluid to keep your pee (urine) clear or pale yellow.  Keep your vagina and butt (rectum) clean. ? Wash the area with warm water every day. ? Wipe from front to back after you use the toilet.  Keep all follow-up visits as told by your doctor. This is important. Preventing this condition  Do not douche.  Use only warm water to wash around your vagina.  Use protection when you have sex. This includes: ? Latex condoms. ? Dental dams.  Limit how many people you have sex with. It is best to only have sex with the same person (be monogamous).  Get tested for STIs. Have your partner get tested.  Wear underwear that is cotton or lined with cotton.  Avoid tight pants and pantyhose. This is most important in summer.  Do not use any products that have nicotine or tobacco in them. These include cigarettes and e-cigarettes. If you need help quitting, ask your doctor.  Do not use illegal drugs.  Limit how much alcohol you drink. Contact a doctor if:  Your symptoms do not get better, even after you are treated.  You have more discharge or pain when you pee (urinate).  You have a  fever.  You have pain in your belly (abdomen).  You have pain with sex.  Your bleed from your vagina between periods. Summary  This infection happens when too many germs (bacteria) grow in the vagina.  Treating this condition can lower your risk for some infections from sex (STIs).  You should also treat this if you are pregnant. It can cause early (premature) birth.  Do not stop taking or using your antibiotic medicine even if you start to feel better. This information is not intended to replace advice given to you by your health care provider. Make sure you discuss any questions you have with your health care provider. Document Released: 06/25/2008 Document Revised: 06/01/2016 Document Reviewed: 06/01/2016 Elsevier Interactive Patient Education  2017 [employer].

## 2017-05-30 NOTE — Telephone Encounter (Signed)
The patient called and stated that her medication was sent to the wrong pharmacy. The patient stated that she would like for her medication to be sent to [facility] in [place], The patient would like a call back to confirm. The patient did not disclose any other information. Please advise.

## 2017-05-30 NOTE — Telephone Encounter (Signed)
Called pt informed her that [facility] [place].

## 2017-06-03 LAB — NUSWAB VAGINITIS PLUS (VG+)
Candida albicans, NAA: NEGATIVE
Candida glabrata, NAA: NEGATIVE
Chlamydia trachomatis, NAA: NEGATIVE
Neisseria gonorrhoeae, NAA: NEGATIVE
TRICH VAG BY NAA: NEGATIVE

## 2017-06-08 NOTE — Progress Notes (Signed)
  GYNECOLOGY OFFICE PROGRESS NOTE  History:  21 y.o. G0P0000 here today for today for IUD string check; Mirena IUD was placed 05/02/2017. Reports intermittent ovarian pain and malodorous vaginal discharge since placement. She started her period approximately two (2) to three (3) days ago.   Denies difficulty breathing or respiratory distress, chest pain, abdominal pain, excessive vaginal bleeding, dysuria, and leg pain or swelling.   The following portions of the patient's history were reviewed and updated as appropriate: allergies, current medications, past family history, past medical history, past social history, past surgical history and problem list.   Review of Systems:   Pertinent items are noted in HPI.   Objective:   Physical Exam  Blood pressure (!) 134/92, pulse 88, height 5\' 7"  (1.702 m), weight 215 lb 4.8 oz (97.7 kg), last menstrual period 04/30/2017.   CONSTITUTIONAL: Well-developed, well-nourished female in no acute distress.   ABDOMEN: Soft, no distention noted.    PELVIC: Normal appearing external genitalia; normal appearing vaginal mucosa and cervix.  IUD strings visualized, about 3 cm in length outside cervix.   Assessment & Plan:   NuSwab collected, will contact patient via MyChart with results.   Reviewed red flag symptoms and when to call.   RTC x 1-2 weeks for [place] to verify IUD placement.   Diona Fanti, CNM

## 2017-06-10 ENCOUNTER — Ambulatory Visit (INDEPENDENT_AMBULATORY_CARE_PROVIDER_SITE_OTHER): Payer: Medicaid Other

## 2017-06-10 DIAGNOSIS — Z30431 Encounter for routine checking of intrauterine contraceptive device: Secondary | ICD-10-CM | POA: Diagnosis not present

## 2017-06-10 DIAGNOSIS — R102 Pelvic and perineal pain: Secondary | ICD-10-CM

## 2017-07-03 ENCOUNTER — Ambulatory Visit (INDEPENDENT_AMBULATORY_CARE_PROVIDER_SITE_OTHER): Payer: Medicaid Other | Admitting: Physician Assistant

## 2017-07-03 ENCOUNTER — Encounter: Payer: Self-pay | Admitting: Physician Assistant

## 2017-07-03 VITALS — BP 120/76 | HR 72 | Temp 98.9°F | Resp 16 | Wt 217.0 lb

## 2017-07-03 DIAGNOSIS — L708 Other acne: Secondary | ICD-10-CM

## 2017-07-03 MED ORDER — ADAPALENE-BENZOYL PEROXIDE 0.1-2.5 % EX GEL: CUTANEOUS | 0 refills | Status: DC

## 2017-07-03 NOTE — Progress Notes (Signed)
       Patient: Brenda Osborn Female    DOB: December 17, 1995   21 y.o.   MRN: [id number] Visit Date: 07/03/2017  Today's Provider: Trinna Post, PA-C   Chief Complaint  Patient presents with  . Acne    Recent break out for about two months   Subjective:    HPI  Brenda Osborn is a 21 y/o woman presenting today for acne worsening over two months. She says acne is present on her face and back. Has tried benzoyl peroxide, proactive OTC. Used Epiduo in the past with good resolution.      Allergies  Allergen Reactions  . Zithromax [Azithromycin]     Respiratory Destress     Current Outpatient Prescriptions:  .  ibuprofen (ADVIL,MOTRIN) 800 MG tablet, Take 1 tablet (800 mg total) by mouth every 8 (eight) hours as needed., Disp: 60 tablet, Rfl: 1 .  levonorgestrel (MIRENA) 20 MCG/24HR IUD, 1 each by Intrauterine route once., Disp: , Rfl:   Review of Systems  Constitutional: Negative.   Skin: Negative.     Social History  Substance Use Topics  . Smoking status: Never Smoker  . Smokeless tobacco: Never Used  . Alcohol use No   Objective:   There were no vitals taken for this visit. There were no vitals filed for this visit.   Physical Exam  Constitutional: She is oriented to person, place, and time. She appears well-developed and well-nourished.  Neurological: She is alert and oriented to person, place, and time.  Skin: Skin is warm and dry.  Scattered erythematous papules on face and back.  Psychiatric: She has a normal mood and affect. Her behavior is normal.        Assessment & Plan:     1. Other acne  Will stain bedclothes.   - Adapalene-Benzoyl Peroxide 0.1-2.5 % gel; Daily use at bedtime.  Dispense: 45 g; Refill: 0  Return if symptoms worsen or fail to improve.  The entirety of the information documented in the History of Present Illness, Review of Systems and Physical Exam were personally obtained by me. Portions of this information were  initially documented by Ashley Royalty, CMA and reviewed by me for thoroughness and accuracy.        Trinna Post, PA-C  [facility]

## 2017-07-03 NOTE — Patient Instructions (Signed)
Acne  Acne is a skin problem that causes small, red bumps (pimples). Acne happens when the tiny holes in your skin (pores) get blocked. Your pores may become red, sore, and swollen. They may also become infected. Acne is a common skin problem. It is especially common in teenagers. Acne usually goes away over time.  Follow these instructions at home:  Good skin care is the most important thing you can do to treat your acne. Take care of your skin as told by your doctor. You may be told to do these things:  · Wash your skin gently at least two times each day. You should also wash your skin:  ? After you exercise.  ? Before you go to bed.  · Use mild soap.  · Use a water-based skin moisturizer after you wash your skin.  · Use a sunscreen or sunblock with SPF 30 or greater. This is very important if you are using acne medicines.  · Choose cosmetics that will not plug your oil glands (are noncomedogenic).    Medicines  · Take over-the-counter and prescription medicines only as told by your doctor.  · If you were prescribed an antibiotic medicine, apply or take it as told by your doctor. Do not stop using the antibiotic even if your acne improves.  General instructions  · Keep your hair clean and off of your face. Shampoo your hair regularly. If you have oily hair, you may need to wash it every day.  · Avoid leaning your chin or forehead on your hands.  · Avoid wearing tight headbands or hats.  · Avoid picking or squeezing your pimples. That can make your acne worse and cause scarring.  · Keep all follow-up visits as told by your doctor. This is important.  · Shave gently. Only shave when it is necessary.  · Keep a food journal. This can help you to see if any foods are linked with your acne.  Contact a doctor if:  · Your acne is not better after eight weeks.  · Your acne gets worse.  · You have a large area of skin that is red or tender.  · You think that you are having side effects from any acne medicine.  This  information is not intended to replace advice given to you by your health care provider. Make sure you discuss any questions you have with your health care provider.  Document Released: 09/05/2011 Document Revised: 02/22/2016 Document Reviewed: 11/23/2014  Elsevier Interactive Patient Education © 2018 Elsevier Inc.

## 2017-07-17 ENCOUNTER — Telehealth: Payer: Self-pay | Admitting: Physician Assistant

## 2017-07-17 NOTE — Telephone Encounter (Signed)
ROI ([facility]) faxed to [facility] for records.

## 2017-07-30 ENCOUNTER — Ambulatory Visit: Payer: Medicaid Other

## 2017-10-03 ENCOUNTER — Encounter: Payer: Self-pay | Admitting: Physician Assistant

## 2017-10-03 ENCOUNTER — Ambulatory Visit (INDEPENDENT_AMBULATORY_CARE_PROVIDER_SITE_OTHER): Payer: Medicaid Other | Admitting: Physician Assistant

## 2017-10-03 VITALS — BP 124/72 | HR 68 | Temp 98.5°F | Resp 16 | Wt 224.0 lb

## 2017-10-03 DIAGNOSIS — L732 Hidradenitis suppurativa: Secondary | ICD-10-CM | POA: Insufficient documentation

## 2017-10-03 DIAGNOSIS — L709 Acne, unspecified: Secondary | ICD-10-CM | POA: Diagnosis not present

## 2017-10-03 DIAGNOSIS — Z111 Encounter for screening for respiratory tuberculosis: Secondary | ICD-10-CM

## 2017-10-03 MED ORDER — ADAPALENE-BENZOYL PEROXIDE 0.1-2.5 % EX GEL: CUTANEOUS | 0 refills | Status: DC

## 2017-10-03 MED ORDER — DOXYCYCLINE HYCLATE 100 MG PO TABS: 100.0000 mg | ORAL_TABLET | Freq: Every day | ORAL | 0 refills | Status: AC

## 2017-10-03 NOTE — Progress Notes (Signed)
Patient: Brenda Osborn Female    DOB: 08-Jan-1996   22 y.o.   MRN: [id number] Visit Date: 10/03/2017  Today's Provider: Trinna Post, PA-C   Chief Complaint  Patient presents with  . Acne   Subjective:    HPI   Brenda Osborn is a 22 y/o woman presenting for TB test for work, acne follow up and treatment for hidradenitis suppurativa.   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   The epiduo never came into the pharmacy, there seems to have been an issue with the fax request. Has tried neutoregna, proactive, bacitracin.   She is also having a flare of an underarm rash that she said was treated previously with antibiotic.       Allergies  Allergen Reactions  . Zithromax [Azithromycin]     Respiratory Destress     Current Outpatient Medications:  .  ibuprofen (ADVIL,MOTRIN) 800 MG tablet, Take 1 tablet (800 mg total) by mouth every 8 (eight) hours as needed., Disp: 60 tablet, Rfl: 1 .  levonorgestrel (MIRENA) 20 MCG/24HR IUD, 1 each by Intrauterine route once., Disp: , Rfl:  .  Adapalene-Benzoyl Peroxide 0.1-2.5 % gel, Use one or twice daily for acne., Disp: 45 g, Rfl: 0 .  doxycycline (VIBRA-TABS) 100 MG tablet, Take 1 tablet (100 mg total) by mouth daily for 14 days., Disp: 14 tablet, Rfl: 0  Review of Systems  Constitutional: Negative.   Skin: Negative.     Social History   Tobacco Use  . Smoking status: Never Smoker  . Smokeless tobacco: Never Used  Substance Use Topics  . Alcohol use: No   Objective:   BP 124/72 (BP Location: Right Arm, Patient Position: Sitting, Cuff Size: Normal)   Pulse 68   Temp 98.5 F (36.9 C) (Oral)   Resp 16   Wt 224 lb (101.6 kg)   BMI 35.08 kg/m  Vitals:   10/03/17 0830  BP: 124/72  Pulse: 68  Resp: 16  Temp: 98.5 F (36.9 C)  TempSrc: Oral  Weight: 224 lb (101.6 kg)     Physical Exam  Constitutional: She is oriented to person, place, and time. She appears  well-developed and well-nourished.  Cardiovascular: Normal rate.  Pulmonary/Chest: Effort normal.  Neurological: She is alert and oriented to person, place, and time.  Skin: Rash noted. There is erythema.  Mixed papules and pustules scattered across face. There are multiple small, scattered and inflamed nodules and papules in axillae bilaterally.  Psychiatric: She has a normal mood and affect. Her behavior is normal.        Assessment & Plan:     1. Screening-pulmonary TB  - QuantiFERON-TB Gold Plus  2. Hidradenitis suppurativa  She denies pregnancy. Will treat as below.   - doxycycline (VIBRA-TABS) 100 MG tablet; Take 1 tablet (100 mg total) by mouth daily for 14 days.  Dispense: 14 tablet; Refill: 0  3. Acne, unspecified acne type  - Adapalene-Benzoyl Peroxide 0.1-2.5 % gel; Use one or twice daily for acne.  Dispense: 45 g; Refill: 0  Return if symptoms worsen or fail to improve.  The entirety of the information documented in the History of Present Illness, Review of Systems and Physical Exam were personally obtained by me. Portions of this information were initially documented by Ashley Royalty, CMA and reviewed by me for thoroughness and accuracy.        Trinna Post, PA-C  [facility]

## 2017-10-03 NOTE — Patient Instructions (Signed)
Tuberculosis (Tuberculosis) La tuberculosis (TB) es una infeccin que produce daos en los tejidos del cuerpo. Por lo general, afecta los pulmones, pero puede afectar otras partes del cuerpo. La TB tiene dos etapas:  TB activa. En esta etapa, la persona presenta los sntomas de TB y puede transmitir la enfermedad fcilmente (es contagiosa).  TB latente. En esta etapa, la persona no tiene ningn sntoma de TB y no contagia la enfermedad. Ms all del tipo de TB, es importante recibir tratamiento. CUIDADOS EN EL HOGAR  Tome el antibitico como se lo indic su mdico. Termnelo aunque comience a sentirse mejor.  Concurra a todas las visitas de control como se lo haya indicado el mdico. Esto es importante.  Informe al mdico sobre todas las personas que viven con usted o con las que tiene contacto cercano. Su mdico o el [facility] puede hablar con estas personas para que se hagan estudios de TB.  Descanse todo lo que sea necesario.  No consuma ningn producto que contenga tabaco, lo que incluye cigarrillos, tabaco de mascar o cigarrillos electrnicos. Si necesita ayuda para dejar de fumar, consulte al mdico.  Evite el contacto cercano con otras personas, especialmente los bebs y las personas de edad avanzada, hasta que el mdico le diga que ya no contagiar la TB.  Al toser o estornudar, cbrase la boca y la nariz. Deseche los pauelos descartables usados como se lo haya indicado el mdico.  Lave sus manos frecuentemente con agua y jabn.  No regrese al trabajo ni al mbito de estudio hasta que el mdico lo autorice. SOLICITE AYUDA SI:  Aparecen nuevos sntomas.  No tiene hambre.  Tiene malestar estomacal (nuseas) o vomita.  Su orina es de color amarillo oscuro.  La piel o la parte blanca de los ojos se torna de color amarillo.  Los sntomas no desaparecen o empeoran.  Tiene fiebre. SOLICITE AYUDA DE INMEDIATO SI:  Siente dolor en el pecho.  Tose y escupe  sangre.  Siente dificultad para respirar o le falta el aire.  Siente dolor de cabeza o rigidez en el cuello. ASEGRESE DE QUE:  Comprende estas instrucciones.  Controlar su afeccin.  Recibir ayuda de inmediato si no mejora o si empeora. Esta informacin no tiene como fin reemplazar el consejo del mdico. Asegrese de hacerle al mdico cualquier pregunta que tenga. Document Released: 09/05/2011 Document Revised: 10/07/2014 Document Reviewed: 12/22/2013 Elsevier Interactive Patient Education  2018 Elsevier Inc.  

## 2017-10-07 ENCOUNTER — Telehealth: Payer: Self-pay

## 2017-10-07 LAB — QUANTIFERON-TB GOLD PLUS
QuantiFERON Mitogen Value: >10 IU/mL
QuantiFERON Nil Value: 0.03 IU/mL
QuantiFERON TB1 Ag Value: 0.04 IU/mL
QuantiFERON TB2 Ag Value: 0.02 IU/mL
QuantiFERON-TB Gold Plus: NEGATIVE

## 2017-10-07 NOTE — Telephone Encounter (Signed)
Pt advised.   Thanks,   -  

## 2017-10-07 NOTE — Telephone Encounter (Signed)
-----   Message from Trinna Post, [place] sent at 10/07/2017  8:40 AM EST ----- TB gold negative.

## 2017-10-29 ENCOUNTER — Telehealth: Payer: Self-pay | Admitting: Physician Assistant

## 2017-10-29 DIAGNOSIS — Z111 Encounter for screening for respiratory tuberculosis: Secondary | ICD-10-CM

## 2017-10-29 NOTE — Telephone Encounter (Signed)
Yes quantiferon Gold please.

## 2017-10-29 NOTE — Telephone Encounter (Signed)
Pt state her job is requiring two TB lab test on file.  States she was just in a month ago and had one done.  Pt needs the test done before 11/07/17.

## 2017-10-29 NOTE — Telephone Encounter (Signed)
Test ordered; pt advised.   Thanks,   -Mickel Baas

## 2017-10-29 NOTE — Telephone Encounter (Signed)
It it okay to order?  Thanks,   -Mickel Baas

## 2017-11-07 ENCOUNTER — Encounter: Payer: Self-pay | Admitting: Physician Assistant

## 2017-11-10 LAB — QUANTIFERON-TB GOLD PLUS
QuantiFERON Mitogen Value: >10 IU/mL
QuantiFERON Nil Value: 0.05 IU/mL
QuantiFERON TB1 Ag Value: 0.04 IU/mL
QuantiFERON TB2 Ag Value: 0.05 IU/mL
QuantiFERON-TB Gold Plus: NEGATIVE

## 2017-11-12 ENCOUNTER — Telehealth: Payer: Self-pay | Admitting: Physician Assistant

## 2017-11-12 ENCOUNTER — Telehealth: Payer: Self-pay

## 2017-11-12 NOTE — Telephone Encounter (Signed)
Can we let patient know that we called [facility] and Medicaid covers name brand, however they do not have access to this so I will have to fill out prior auth for generic. Pharmacy says they will send this over to our office.

## 2017-11-12 NOTE — Telephone Encounter (Signed)
Pt advised.   Thanks,   -  

## 2017-11-12 NOTE — Telephone Encounter (Signed)
-----   Message from Trinna Post, [place] sent at 11/12/2017  9:06 AM EST ----- Quantiferon gold negative.

## 2017-11-14 ENCOUNTER — Other Ambulatory Visit: Payer: Self-pay | Admitting: Physician Assistant

## 2017-11-14 DIAGNOSIS — L700 Acne vulgaris: Secondary | ICD-10-CM

## 2017-11-14 MED ORDER — ADAPALENE-BENZOYL PEROXIDE 0.1-2.5 % EX GEL: CUTANEOUS | 0 refills | Status: DC

## 2017-11-14 NOTE — Telephone Encounter (Signed)
Advised pt. She would like to try [facility] to see if they have the name brand Epiduo.   Thanks,   -Mickel Baas

## 2017-11-14 NOTE — Progress Notes (Signed)
Sent in [place] name brand to [facility] because Medicaid prefers name brand and [facility] does not stock this. No action necessary.

## 2017-11-24 ENCOUNTER — Encounter: Payer: Self-pay | Admitting: Family Medicine

## 2017-11-24 ENCOUNTER — Ambulatory Visit (INDEPENDENT_AMBULATORY_CARE_PROVIDER_SITE_OTHER): Payer: Medicaid Other | Admitting: Family Medicine

## 2017-11-24 VITALS — BP 126/74 | HR 83 | Temp 98.7°F | Resp 16 | Wt 227.0 lb

## 2017-11-24 DIAGNOSIS — L304 Erythema intertrigo: Secondary | ICD-10-CM | POA: Diagnosis not present

## 2017-11-24 MED ORDER — CLOTRIMAZOLE-BETAMETHASONE 1-0.05 % EX CREA: 1.0000 "application " | TOPICAL_CREAM | Freq: Two times a day (BID) | CUTANEOUS | 0 refills | Status: DC

## 2017-11-24 NOTE — Progress Notes (Signed)
Patient: Brenda Osborn Female    DOB: [birth year]   22 y.o.   MRN: [id number] Visit Date: 11/24/2017  Today's Provider: Lavon Paganini, MD   I, Martha Clan, CMA, am acting as scribe for Lavon Paganini, MD.  Chief Complaint  Patient presents with  . Rash   Subjective:    Rash  This is a recurrent problem. Episode onset: x 3 weeks. The problem has been gradually worsening since onset. Location: bilateral axilla. The rash is characterized by redness, itchiness and burning. She was exposed to nothing. Associated symptoms include congestion. Pertinent negatives include no anorexia, cough, diarrhea, eye pain, facial edema, fatigue, fever, joint pain, nail changes, rhinorrhea, shortness of breath, sore throat or vomiting. Treatments tried: Aquaphor. The treatment provided mild relief.   Had this before and self-limited to 1.5 weeks Tried to wait this out Unable to shave/wash because too tender    Allergies  Allergen Reactions  . Zithromax [Azithromycin]     Respiratory Destress     Current Outpatient Medications:  .  Adapalene-Benzoyl Peroxide (EPIDUO) 0.1-2.5 % gel, Apply topically at bedtime., Disp: 45 g, Rfl: 0 .  ibuprofen (ADVIL,MOTRIN) 800 MG tablet, Take 1 tablet (800 mg total) by mouth every 8 (eight) hours as needed., Disp: 60 tablet, Rfl: 1 .  levonorgestrel (MIRENA) 20 MCG/24HR IUD, 1 each by Intrauterine route once., Disp: , Rfl:   Review of Systems  Constitutional: Negative for fatigue and fever.  HENT: Positive for congestion. Negative for rhinorrhea and sore throat.   Eyes: Negative for pain.  Respiratory: Negative for cough and shortness of breath.   Gastrointestinal: Negative for anorexia, diarrhea and vomiting.  Musculoskeletal: Negative for joint pain.  Skin: Positive for rash. Negative for nail changes.    Social History   Tobacco Use  . Smoking status: Never Smoker  . Smokeless tobacco: Never Used  Substance Use Topics  . Alcohol  use: No   Objective:   BP 126/74 (BP Location: Left Arm, Patient Position: Sitting, Cuff Size: Large)   Pulse 83   Temp 98.7 F (37.1 C) (Oral)   Resp 16   Wt 227 lb (103 kg)   SpO2 99%   BMI 35.55 kg/m  Vitals:   11/24/17 1447  BP: 126/74  Pulse: 83  Resp: 16  Temp: 98.7 F (37.1 C)  TempSrc: Oral  SpO2: 99%  Weight: 227 lb (103 kg)     Physical Exam  Constitutional: She is oriented to person, place, and time. She appears well-developed and well-nourished. No distress.  HENT:  Head: Normocephalic and atraumatic.  Eyes: Conjunctivae are normal.  Cardiovascular: Normal rate, regular rhythm, normal heart sounds and intact distal pulses.  No murmur heard. Pulmonary/Chest: Effort normal and breath sounds normal. No respiratory distress. She has no wheezes. She has no rales.  Musculoskeletal: She exhibits no edema or deformity.  Neurological: She is alert and oriented to person, place, and time.  Skin:  Erythematous thickened rash with satellite lesions in b/l axilla. Few surrounding comedones   Psychiatric: She has a normal mood and affect. Her behavior is normal.  Vitals reviewed.       Assessment & Plan:   1. Intertrigo - rash c/w intertrigo of b/l axilla - explained natural course of yeast infections - discussed keeping area as clean and dry as possible - treat with Lotrisone cream BID to help with inflammation and itching as well as intertrigo - can use emollient prn as well - avoid  shaving or other irritants - return precautions discussed   Meds ordered this encounter  Medications  . clotrimazole-betamethasone (LOTRISONE) cream    Sig: Apply 1 application topically 2 (two) times daily.    Dispense:  45 g    Refill:  0     Return if symptoms worsen or fail to improve.   The entirety of the information documented in the History of Present Illness, Review of Systems and Physical Exam were personally obtained by me. Portions of this information were  initially documented by Raquel Sarna Ratchford, CMA and reviewed by me for thoroughness and accuracy.    Virginia Crews, MD, MPH [facility] 11/24/2017 3:14 PM

## 2017-11-24 NOTE — Patient Instructions (Signed)
Intertrigo Intertrigo is skin irritation or inflammation (dermatitis) that occurs when folds of skin rub together. The irritation can cause a rash and make skin raw and itchy. This condition most commonly occurs in the skin folds of these areas:  Toes.  Armpits.  Groin.  Belly.  Breasts.  Buttocks.  Intertrigo is not passed from person to person (is not contagious). What are the causes? This condition is caused by heat, moisture, friction, and lack of air circulation. The condition can be made worse by:  Sweat.  Bacteria or a fungus, such as yeast.  What increases the risk? This condition is more likely to occur if you have moisture in your skin folds. It is also more likely to develop in people who:  Have diabetes.  Are overweight.  Are on bed rest.  Live in a warm and moist climate.  Wear splints, braces, or other medical devices.  Are not able to control their bowels or bladder (have incontinence).  What are the signs or symptoms? Symptoms of this condition include:  A pink or red skin rash.  Brown patches on the skin.  Raw or scaly skin.  Itchiness.  A burning feeling.  Bleeding.  Leaking fluid.  A bad smell.  How is this diagnosed? This condition is diagnosed with a medical history and physical exam. You may also have a skin swab to test for bacteria or a fungus, such as yeast. How is this treated? Treatment may include:  Cleaning and drying your skin.  An oral antibiotic medicine or antibiotic skin cream for a bacterial infection.  Antifungal cream or pills for an infection that was caused by a fungus, such as yeast.  Steroid ointment to relieve itchiness and irritation.  Follow these instructions at home:  Keep the affected area clean and dry.  Do not scratch your skin.  Stay in a cool environment as much as possible. Use an air conditioner or fan, if available.  Apply over-the-counter and prescription medicines only as told by your  health care provider.  If you were prescribed an antibiotic medicine, use it as told by your health care provider. Do not stop using the antibiotic even if your condition improves.  Keep all follow-up visits as told by your health care provider. This is important. How is this prevented?  Maintain a healthy weight.  Take care of your feet, especially if you have diabetes. Foot care includes: ? Wearing shoes that fit well. ? Keeping your feet dry. ? Wearing clean, breathable socks.  Protect the skin around your groin and buttocks, especially if you have incontinence. Skin protection includes: ? Following a regular cleaning routine. ? Using moisturizers and skin protectants. ? Changing protection pads frequently.  Do not wear tight clothes. Wear clothes that are loose and absorbent. Wear clothes that are made of cotton.  Wear a bra that gives good support, if needed.  Shower and dry yourself thoroughly after activity. Use a hair dryer on a cool setting to dry between skin folds, especially after you bathe.  If you have diabetes, keep your blood sugar under control. Contact a health care provider if:  Your symptoms do not improve with treatment.  Your symptoms get worse or they spread.  You notice increased redness and warmth.  You have a fever. This information is not intended to replace advice given to you by your health care provider. Make sure you discuss any questions you have with your health care provider. Document Released: 09/16/2005 Document Revised: 02/22/2016   Document Reviewed: 03/20/2015 Elsevier Interactive Patient Education  2018 Elsevier Inc.  

## 2017-12-11 ENCOUNTER — Ambulatory Visit (INDEPENDENT_AMBULATORY_CARE_PROVIDER_SITE_OTHER): Payer: Medicaid Other | Admitting: Family Medicine

## 2017-12-11 ENCOUNTER — Encounter: Payer: Self-pay | Admitting: Family Medicine

## 2017-12-11 VITALS — BP 124/96 | HR 106 | Temp 98.3°F | Resp 16 | Wt 230.0 lb

## 2017-12-11 DIAGNOSIS — N390 Urinary tract infection, site not specified: Secondary | ICD-10-CM

## 2017-12-11 LAB — POCT URINALYSIS DIPSTICK
BILIRUBIN UA: NEGATIVE
Glucose, UA: NEGATIVE
Ketones, UA: NEGATIVE
NITRITE UA: NEGATIVE
PH UA: 6.5 (ref 5.0–8.0)
RBC UA: NEGATIVE
Spec Grav, UA: 1.02 (ref 1.010–1.025)
UROBILINOGEN UA: 0.2 U/dL

## 2017-12-11 LAB — POCT URINE PREGNANCY: PREG TEST UR: NEGATIVE

## 2017-12-11 MED ORDER — CEPHALEXIN 500 MG PO CAPS: 500.0000 mg | ORAL_CAPSULE | Freq: Two times a day (BID) | ORAL | 0 refills | Status: DC

## 2017-12-11 MED ORDER — HYDROCODONE-ACETAMINOPHEN 5-325 MG PO TABS: ORAL_TABLET | ORAL | 0 refills | Status: DC

## 2017-12-11 NOTE — Patient Instructions (Signed)
We will call you with the urine culture results. Let us know if new symptoms.

## 2017-12-11 NOTE — Progress Notes (Signed)
Subjective:     Patient ID: Brenda Osborn, female   DOB: November 02, 1995, 22 y.o.   MRN: [id number] Chief Complaint  Patient presents with  . Back Pain    Patient comes in office today with complaints of lower back pain that began this morning, patient reports nausea and abdominal cramping associated.    HPI Reports urinary frequency and feeling feverish. Currently working as a Programmer, applications. Denies back injury.  Review of Systems     Objective:   Physical Exam  Constitutional: She appears well-developed and well-nourished. She appears distressed (moderate distress from low back pain).  Abdominal: Soft. There is no tenderness. There is no guarding.  Genitourinary:  Genitourinary Comments: Tender right upper lumbar area near cva       Assessment:    1. Urinary tract infection without hematuria, site unspecified; ? Incipient pyelonephritis - Urine Culture - POCT urinalysis dipstick - POCT urine pregnancy - cephALEXin (KEFLEX) 500 MG capsule; Take 1 capsule (500 mg total) by mouth 2 (two) times daily.  Dispense: 14 capsule; Refill: 0 - HYDROcodone-acetaminophen (NORCO/VICODIN) 5-325 MG tablet; One every 4-6 hours as needed for pain  Dispense: 20 tablet; Refill: 0    Plan:    Further f/u pending urine culture or new symptoms.

## 2017-12-13 LAB — URINE CULTURE

## 2017-12-15 ENCOUNTER — Telehealth: Payer: Self-pay

## 2017-12-15 NOTE — Telephone Encounter (Signed)
Patient advised.KW 

## 2017-12-15 NOTE — Telephone Encounter (Signed)
Patient reports that her back pain has improved. However, she still has the burning sensation while urinating.

## 2017-12-15 NOTE — Telephone Encounter (Signed)
-----   Message from Carmon Ginsberg, [place] sent at 12/15/2017  7:30 AM EDT ----- No specific organism detected. How is she feeling?

## 2017-12-15 NOTE — Telephone Encounter (Signed)
Complete the antibiotic. If still not resolved would repeat the urine culture.

## 2017-12-15 NOTE — Telephone Encounter (Signed)
Left message to call back  

## 2017-12-24 ENCOUNTER — Ambulatory Visit (INDEPENDENT_AMBULATORY_CARE_PROVIDER_SITE_OTHER): Payer: Medicaid Other | Admitting: Physician Assistant

## 2017-12-24 ENCOUNTER — Encounter: Payer: Self-pay | Admitting: Physician Assistant

## 2017-12-24 VITALS — BP 138/84 | HR 84 | Temp 98.5°F | Resp 16 | Wt 226.0 lb

## 2017-12-24 DIAGNOSIS — R350 Frequency of micturition: Secondary | ICD-10-CM

## 2017-12-24 LAB — POCT URINALYSIS DIPSTICK
Bilirubin, UA: NEGATIVE
Blood, UA: NEGATIVE
Glucose, UA: NEGATIVE
Ketones, UA: NEGATIVE
Leukocytes, UA: NEGATIVE
Nitrite, UA: NEGATIVE
Protein, UA: NEGATIVE
Spec Grav, UA: 1.025 (ref 1.010–1.025)
Urobilinogen, UA: 0.2 E.U./dL
pH, UA: 5 (ref 5.0–8.0)

## 2017-12-24 MED ORDER — SULFAMETHOXAZOLE-TRIMETHOPRIM 800-160 MG PO TABS: 1.0000 | ORAL_TABLET | Freq: Two times a day (BID) | ORAL | 0 refills | Status: AC

## 2017-12-24 NOTE — Progress Notes (Signed)
[place]  Chief Complaint  Patient presents with  . Urinary Tract Infection    Subjective:    Patient ID: Brenda Osborn, female    DOB: Jan 12, 1996, 22 y.o.   MRN: [id number]   Urinary Tract Infection: Patient complains of burning with urination and incomplete bladder emptying She has had symptoms for 6 days. Patient also complains of back pain. Patient denies fever, stomach ache and vaginal discharge. Patient does have a history of recurrent UTI.  Patient does not have a history of pyelonephritis or other renal issues. Patient denies vaginal discharge and denies new sexual partners. The patient denies recent travel outside of the [place]. She was seen for the same on 12/11/2016 and treated with Keflex but with negative urine culture. She has had IUD placed last year. Denies STI. Denies fever, chills, n/v.   Review of Systems  Constitutional: Positive for malaise/fatigue. Negative for chills, diaphoresis, fever and weight loss.  Genitourinary: Positive for dysuria and urgency. Negative for flank pain, frequency and hematuria.  Musculoskeletal: Positive for back pain (Right sided).  Neurological: Negative for dizziness and headaches.       Objective:   BP 138/84 (BP Location: Right Arm, Patient Position: Sitting, Cuff Size: Large)   Pulse 84   Temp 98.5 F (36.9 C) (Oral)   Resp 16   Wt 226 lb (102.5 kg)   BMI 35.40 kg/m   Patient Active Problem List   Diagnosis Date Noted  . Hidradenitis suppurativa 10/03/2017  . Elevated BP without diagnosis of hypertension 03/28/2017  . Headache 03/28/2017    Outpatient Encounter Medications as of 12/24/2017  Medication Sig  . Adapalene-Benzoyl Peroxide (EPIDUO) 0.1-2.5 % gel Apply topically at bedtime.  Marland Kitchen ibuprofen (ADVIL,MOTRIN) 800 MG tablet Take 1 tablet (800 mg total) by mouth every 8 (eight) hours as needed.  Marland Kitchen levonorgestrel (MIRENA) 20 MCG/24HR IUD 1 each by Intrauterine route  once.  . [DISCONTINUED] cephALEXin (KEFLEX) 500 MG capsule Take 1 capsule (500 mg total) by mouth 2 (two) times daily.  . clotrimazole-betamethasone (LOTRISONE) cream Apply 1 application topically 2 (two) times daily. (Patient not taking: Reported on 12/24/2017)  . HYDROcodone-acetaminophen (NORCO/VICODIN) 5-325 MG tablet One every 4-6 hours as needed for pain (Patient not taking: Reported on 12/24/2017)  . sulfamethoxazole-trimethoprim (BACTRIM DS) 800-160 MG tablet Take 1 tablet by mouth 2 (two) times daily for 3 days.   No facility-administered encounter medications on file as of 12/24/2017.     Allergies  Allergen Reactions  . Zithromax [Azithromycin]     Respiratory Destress       Physical Exam  Constitutional: She is oriented to person, place, and time. She appears well-developed and well-nourished.  Cardiovascular: Normal rate and regular rhythm.  Pulmonary/Chest: Effort normal and breath sounds normal.  Abdominal: Soft. Bowel sounds are normal. She exhibits no distension. There is no tenderness. There is no rebound, no guarding and no CVA tenderness.  Neurological: She is alert and oriented to person, place, and time.  Skin: Skin is warm and dry.  Psychiatric: She has a normal mood and affect. Her behavior is normal.       Assessment & Plan:  1. Urinary frequency  Will treat again. Suspect she may have vaginal infection, suggest pelvic exam but patient not prepared today. If sx continue despite treatment, she should return for pelvic exam and we can do string check at that time.  - sulfamethoxazole-trimethoprim (BACTRIM DS) 800-160 MG tablet; Take 1 tablet by mouth  2 (two) times daily for 3 days.  Dispense: 6 tablet; Refill: 0 - CULTURE, URINE COMPREHENSIVE - POCT urinalysis dipstick  Return if symptoms worsen or fail to improve.  The entirety of the information documented in the History of Present Illness, Review of Systems and Physical Exam were personally obtained by  me. Portions of this information were initially documented by Ashley Royalty, CMA and reviewed by me for thoroughness and accuracy.                   uti

## 2017-12-27 LAB — CULTURE, URINE COMPREHENSIVE

## 2017-12-30 ENCOUNTER — Telehealth: Payer: Self-pay

## 2017-12-30 NOTE — Telephone Encounter (Signed)
Pt advised.  She reports feeling much better now.  She will call back if her symptoms return.   Thanks,   -Mickel Baas

## 2017-12-30 NOTE — Telephone Encounter (Signed)
-----   Message from Trinna Post, [place] sent at 12/29/2017  1:53 PM EDT ----- No growth on urine culture. Would suggest vaginal exam if symptoms persist and we can do string check at that point.

## 2018-02-16 ENCOUNTER — Ambulatory Visit: Payer: Medicaid Other | Admitting: Physician Assistant

## 2018-02-17 ENCOUNTER — Encounter: Payer: Self-pay | Admitting: Physician Assistant

## 2018-02-17 ENCOUNTER — Ambulatory Visit (INDEPENDENT_AMBULATORY_CARE_PROVIDER_SITE_OTHER): Payer: Medicaid Other | Admitting: Physician Assistant

## 2018-02-17 VITALS — BP 124/84 | HR 88 | Temp 99.2°F | Resp 16 | Wt 232.0 lb

## 2018-02-17 DIAGNOSIS — G43809 Other migraine, not intractable, without status migrainosus: Secondary | ICD-10-CM

## 2018-02-17 DIAGNOSIS — D229 Melanocytic nevi, unspecified: Secondary | ICD-10-CM

## 2018-02-17 MED ORDER — SUMATRIPTAN SUCCINATE 50 MG PO TABS: ORAL_TABLET | ORAL | 0 refills | Status: DC

## 2018-02-17 NOTE — Patient Instructions (Signed)
B2 riboflavin 200 mg daily Magnesium oxide 400 mg daily  Migraine Headache A migraine headache is a very strong throbbing pain on one side or both sides of your head. Migraines can also cause other symptoms. Talk with your doctor about what things may bring on (trigger) your migraine headaches. Follow these instructions at home: Medicines  Take over-the-counter and prescription medicines only as told by your doctor.  Do not drive or use heavy machinery while taking prescription pain medicine.  To prevent or treat constipation while you are taking prescription pain medicine, your doctor may recommend that you: ? Drink enough fluid to keep your pee (urine) clear or pale yellow. ? Take over-the-counter or prescription medicines. ? Eat foods that are high in fiber. These include fresh fruits and vegetables, whole grains, and beans. ? Limit foods that are high in fat and processed sugars. These include fried and sweet foods. Lifestyle  Avoid alcohol.  Do not use any products that contain nicotine or tobacco, such as cigarettes and e-cigarettes. If you need help quitting, ask your doctor.  Get at least 8 hours of sleep every night.  Limit your stress. General instructions   Keep a journal to find out what may bring on your migraines. For example, write down: ? What you eat and drink. ? How much sleep you get. ? Any change in what you eat or drink. ? Any change in your medicines.  If you have a migraine: ? Avoid things that make your symptoms worse, such as bright lights. ? It may help to lie down in a dark, quiet room. ? Do not drive or use heavy machinery. ? Ask your doctor what activities are safe for you.  Keep all follow-up visits as told by your doctor. This is important. Contact a doctor if:  You get a migraine that is different or worse than your usual migraines. Get help right away if:  Your migraine gets very bad.  You have a fever.  You have a stiff neck.  You  have trouble seeing.  Your muscles feel weak or like you cannot control them.  You start to lose your balance a lot.  You start to have trouble walking.  You pass out (faint). This information is not intended to replace advice given to you by your health care provider. Make sure you discuss any questions you have with your health care provider. Document Released: 06/25/2008 Document Revised: 04/05/2016 Document Reviewed: 03/04/2016 Elsevier Interactive Patient Education  2018 [employer].

## 2018-02-17 NOTE — Progress Notes (Signed)
Patient: Brenda Osborn Female    DOB: 05-01-1996   22 y.o.   MRN: [id number] Visit Date: 02/17/2018  Today's Provider: Trinna Post, PA-C   Chief Complaint  Patient presents with  . Migraine    Worsening.  . Nevus    Needs referral to Derm.     Subjective:    Brenda Osborn is a 22 y/o woman presenting today for headaches. Several months ago patient was seen at this clinic as a new patient. At the time she was on a combo OCP and was experiencing high blood pressure and headaches. It was thought her headaches may be related to her high BP. Her OCP was discontinued and she had an IUD placed. Her blood pressure returned to normal.   Today, she reports her headaches continue. She reports a daily headache. She reports sleeping well, though not quite 8 hours. She has two 24 oz bottles of Dr. Malachi Bonds daily. She uses 800 mg ibuprofen daily which doesn't seem to work for her headaches. She reports she has a near constant headache that can sometimes worsen. About 3-4 times per week she experiences unilateral pain around one eye, associated with nausea and vomiting, photophobia and phonophobia. Sometimes, she will lie down to help this and sometimes the headache will go completely away, other times, the headache will continue but to a lesser extent. She denies any history of aura.   She denies history of stroke, history of arrhythmia.   Migraine   This is a recurrent problem. The problem occurs daily. The problem has been gradually worsening. The pain is located in the bilateral region. The pain quality is similar to prior headaches. The quality of the pain is described as pulsating and sharp (Pounding). Associated symptoms include blurred vision, dizziness, nausea, phonophobia, photophobia, a visual change and vomiting. Pertinent negatives include no abdominal pain, eye pain, eye redness, eye watering, insomnia, loss of balance, scalp tenderness or seizures. Nothing aggravates the  symptoms. She has tried NSAIDs for the symptoms. The treatment provided mild relief.         Allergies  Allergen Reactions  . Zithromax [Azithromycin]     Respiratory Destress     Current Outpatient Medications:  .  Adapalene-Benzoyl Peroxide (EPIDUO) 0.1-2.5 % gel, Apply topically at bedtime., Disp: 45 g, Rfl: 0 .  ibuprofen (ADVIL,MOTRIN) 800 MG tablet, Take 1 tablet (800 mg total) by mouth every 8 (eight) hours as needed., Disp: 60 tablet, Rfl: 1 .  levonorgestrel (MIRENA) 20 MCG/24HR IUD, 1 each by Intrauterine route once., Disp: , Rfl:  .  clotrimazole-betamethasone (LOTRISONE) cream, Apply 1 application topically 2 (two) times daily., Disp: 45 g, Rfl: 0 .  HYDROcodone-acetaminophen (NORCO/VICODIN) 5-325 MG tablet, One every 4-6 hours as needed for pain (Patient not taking: Reported on 12/24/2017), Disp: 20 tablet, Rfl: 0  Review of Systems  Eyes: Positive for blurred vision, photophobia and visual disturbance. Negative for pain, discharge, redness and itching.  Gastrointestinal: Positive for nausea and vomiting. Negative for abdominal distention, abdominal pain, anal bleeding, blood in stool, constipation, diarrhea and rectal pain.  Neurological: Positive for dizziness and headaches. Negative for seizures, light-headedness and loss of balance.  Psychiatric/Behavioral: The patient does not have insomnia.     Social History   Tobacco Use  . Smoking status: Never Smoker  . Smokeless tobacco: Never Used  Substance Use Topics  . Alcohol use: No   Objective:   BP 124/84 (BP Location: Right Arm, Patient  Position: Sitting, Cuff Size: Large)   Pulse 88   Temp 99.2 F (37.3 C) (Oral)   Resp 16   Wt 232 lb (105.2 kg)   BMI 36.34 kg/m  Vitals:   02/17/18 1445  BP: 124/84  Pulse: 88  Resp: 16  Temp: 99.2 F (37.3 C)  TempSrc: Oral  Weight: 232 lb (105.2 kg)     Physical Exam  Constitutional: She is oriented to person, place, and time. She appears well-developed and  well-nourished.  HENT:  Head: Normocephalic and atraumatic.  Eyes: Pupils are equal, round, and reactive to light. Conjunctivae and EOM are normal.  Cardiovascular: Normal rate and regular rhythm.  Pulmonary/Chest: Effort normal and breath sounds normal.  Neurological: She is alert and oriented to person, place, and time. No cranial nerve deficit. Coordination normal.  Skin: Skin is warm and dry.  Psychiatric: She has a normal mood and affect. Her behavior is normal.        Assessment & Plan:     1. Other migraine without status migrainosus, not intractable  Chronic, worsening. Will prescribe abortive therapy as below. Will advise her to start B2 200 mg daily and magnesium oxide 400 mg daily. Advised magnesium can cause diarrhea. We will follow up in one month to see how she is doing. If headaches have not improved satisfactorily, we can consider other medications like propranolol and Effexor.   - SUMAtriptan (IMITREX) 50 MG tablet; Take 25 mg or half tab at first sign of migraine. May repeat in 2 hours if headache persists or recurs.  Dispense: 10 tablet; Refill: 0  2. Atypical mole  Needs referral to see dermatology due to insurance. She reports a mole on her left breast that has grown in size.  - Ambulatory referral to Dermatology  Return in about 1 month (around 03/20/2018) for headaches .  The entirety of the information documented in the History of Present Illness, Review of Systems and Physical Exam were personally obtained by me. Portions of this information were initially documented by Ashley Royalty, CMA and reviewed by me for thoroughness and accuracy.        Trinna Post, PA-C  [facility]

## 2018-03-02 ENCOUNTER — Telehealth: Payer: Self-pay | Admitting: Physician Assistant

## 2018-03-02 NOTE — Telephone Encounter (Signed)
Pt has a couple question regarding the medication for headaches that she was recently put on.  Pt's call back is [phone]

## 2018-03-03 NOTE — Telephone Encounter (Signed)
Unfortunately, that can be a side effect of the medication. Would recommend trying excedrin migraine to take acutely for headaches, continue with vitamins. Excedrin migraine should be used sparingly as well.

## 2018-03-03 NOTE — Telephone Encounter (Signed)
Spoke with [employer].  She states her migraines have improved since starting B2 and magnesium.  She does report having a migraine a few days ago, she needed to take Imitrex that was prescribed.  She reports after taking it she experienced worsening headache, chest pain and "Just felt bad".  She says the symptoms resolved once the medicine wore off.  She is wanted to know if there is something else she could try.    Pt uses [facility].   Thanks,   -Mickel Baas

## 2018-03-04 NOTE — Telephone Encounter (Signed)
Patient advised. She verbalized understanding.  

## 2018-03-13 ENCOUNTER — Ambulatory Visit (INDEPENDENT_AMBULATORY_CARE_PROVIDER_SITE_OTHER): Payer: Medicaid Other | Admitting: Certified Nurse Midwife

## 2018-03-13 ENCOUNTER — Encounter: Payer: Self-pay | Admitting: Certified Nurse Midwife

## 2018-03-13 VITALS — BP 110/82 | HR 88 | Ht 69.0 in | Wt 230.8 lb

## 2018-03-13 DIAGNOSIS — Z124 Encounter for screening for malignant neoplasm of cervix: Secondary | ICD-10-CM | POA: Diagnosis not present

## 2018-03-13 DIAGNOSIS — N898 Other specified noninflammatory disorders of vagina: Secondary | ICD-10-CM | POA: Diagnosis not present

## 2018-03-13 DIAGNOSIS — Z0000 Encounter for general adult medical examination without abnormal findings: Secondary | ICD-10-CM | POA: Diagnosis not present

## 2018-03-13 DIAGNOSIS — Z01419 Encounter for gynecological examination (general) (routine) without abnormal findings: Secondary | ICD-10-CM | POA: Diagnosis not present

## 2018-03-13 NOTE — Patient Instructions (Signed)
Levonorgestrel intrauterine device (IUD) What is this medicine? LEVONORGESTREL IUD (LEE voe nor jes trel) is a contraceptive (birth control) device. The device is placed inside the uterus by a healthcare professional. It is used to prevent pregnancy. This device can also be used to treat heavy bleeding that occurs during your period. This medicine may be used for other purposes; ask your health care provider or pharmacist if you have questions. COMMON BRAND NAME(S): Minette Headland What should I tell my health care provider before I take this medicine? They need to know if you have any of these conditions: -abnormal Pap smear -cancer of the breast, uterus, or cervix -diabetes -endometritis -genital or pelvic infection now or in the past -have more than one sexual partner or your partner has more than one partner -heart disease -history of an ectopic or tubal pregnancy -immune system problems -IUD in place -liver disease or tumor -problems with blood clots or take blood-thinners -seizures -use intravenous drugs -uterus of unusual shape -vaginal bleeding that has not been explained -an unusual or allergic reaction to levonorgestrel, other hormones, silicone, or polyethylene, medicines, foods, dyes, or preservatives -pregnant or trying to get pregnant -breast-feeding How should I use this medicine? This device is placed inside the uterus by a health care professional. Talk to your pediatrician regarding the use of this medicine in children. Special care may be needed. Overdosage: If you think you have taken too much of this medicine contact a poison control center or emergency room at once. NOTE: This medicine is only for you. Do not share this medicine with others. What if I miss a dose? This does not apply. Depending on the brand of device you have inserted, the device will need to be replaced every 3 to 5 years if you wish to continue using this type of birth  control. What may interact with this medicine? Do not take this medicine with any of the following medications: -amprenavir -bosentan -fosamprenavir This medicine may also interact with the following medications: -aprepitant -armodafinil -barbiturate medicines for inducing sleep or treating seizures -bexarotene -boceprevir -griseofulvin -medicines to treat seizures like carbamazepine, ethotoin, felbamate, oxcarbazepine, phenytoin, topiramate -modafinil -pioglitazone -rifabutin -rifampin -rifapentine -some medicines to treat HIV infection like atazanavir, efavirenz, indinavir, lopinavir, nelfinavir, tipranavir, ritonavir -St. John's wort -warfarin This list may not describe all possible interactions. Give your health care provider a list of all the medicines, herbs, non-prescription drugs, or dietary supplements you use. Also tell them if you smoke, drink alcohol, or use illegal drugs. Some items may interact with your medicine. What should I watch for while using this medicine? Visit your doctor or health care professional for regular check ups. See your doctor if you or your partner has sexual contact with others, becomes HIV positive, or gets a sexual transmitted disease. This product does not protect you against HIV infection (AIDS) or other sexually transmitted diseases. You can check the placement of the IUD yourself by reaching up to the top of your vagina with clean fingers to feel the threads. Do not pull on the threads. It is a good habit to check placement after each menstrual period. Call your doctor right away if you feel more of the IUD than just the threads or if you cannot feel the threads at all. The IUD may come out by itself. You may become pregnant if the device comes out. If you notice that the IUD has come out use a backup birth control method like condoms and call your  health care provider. Using tampons will not change the position of the IUD and are okay to use  during your period. This IUD can be safely scanned with magnetic resonance imaging (MRI) only under specific conditions. Before you have an MRI, tell your healthcare provider that you have an IUD in place, and which type of IUD you have in place. What side effects may I notice from receiving this medicine? Side effects that you should report to your doctor or health care professional as soon as possible: -allergic reactions like skin rash, itching or hives, swelling of the face, lips, or tongue -fever, flu-like symptoms -genital sores -high blood pressure -no menstrual period for 6 weeks during use -pain, swelling, warmth in the leg -pelvic pain or tenderness -severe or sudden headache -signs of pregnancy -stomach cramping -sudden shortness of breath -trouble with balance, talking, or walking -unusual vaginal bleeding, discharge -yellowing of the eyes or skin Side effects that usually do not require medical attention (report to your doctor or health care professional if they continue or are bothersome): -acne -breast pain -change in sex drive or performance -changes in weight -cramping, dizziness, or faintness while the device is being inserted -headache -irregular menstrual bleeding within first 3 to 6 months of use -nausea This list may not describe all possible side effects. Call your doctor for medical advice about side effects. You may report side effects to FDA at 1-800-FDA-1088. Where should I keep my medicine? This does not apply. NOTE: This sheet is a summary. It may not cover all possible information. If you have questions about this medicine, talk to your doctor, pharmacist, or health care provider.  2018 Elsevier/Gold Standard (2016-06-28 14:14:56) Preventive Care 18-39 Years, Female Preventive care refers to lifestyle choices and visits with your health care provider that can promote health and wellness. What does preventive care include?  A yearly physical exam.  This is also called an annual well check.  Dental exams once or twice a year.  Routine eye exams. Ask your health care provider how often you should have your eyes checked.  Personal lifestyle choices, including: ? Daily care of your teeth and gums. ? Regular physical activity. ? Eating a healthy diet. ? Avoiding tobacco and drug use. ? Limiting alcohol use. ? Practicing safe sex. ? Taking vitamin and mineral supplements as recommended by your health care provider. What happens during an annual well check? The services and screenings done by your health care provider during your annual well check will depend on your age, overall health, lifestyle risk factors, and family history of disease. Counseling Your health care provider may ask you questions about your:  Alcohol use.  Tobacco use.  Drug use.  Emotional well-being.  Home and relationship well-being.  Sexual activity.  Eating habits.  Work and work environment.  Method of birth control.  Menstrual cycle.  Pregnancy history.  Screening You may have the following tests or measurements:  Height, weight, and BMI.  Diabetes screening. This is done by checking your blood sugar (glucose) after you have not eaten for a while (fasting).  Blood pressure.  Lipid and cholesterol levels. These may be checked every 5 years starting at age 20.  Skin check.  Hepatitis C blood test.  Hepatitis B blood test.  Sexually transmitted disease (STD) testing.  BRCA-related cancer screening. This may be done if you have a family history of breast, ovarian, tubal, or peritoneal cancers.  Pelvic exam and Pap test. This may be done every 3   years starting at age 14. Starting at age 51, this may be done every 5 years if you have a Pap test in combination with an HPV test.  Discuss your test results, treatment options, and if necessary, the need for more tests with your health care provider. Vaccines Your health care provider  may recommend certain vaccines, such as:  Influenza vaccine. This is recommended every year.  Tetanus, diphtheria, and acellular pertussis (Tdap, Td) vaccine. You may need a Td booster every 10 years.  Varicella vaccine. You may need this if you have not been vaccinated.  HPV vaccine. If you are 67 or younger, you may need three doses over 6 months.  Measles, mumps, and rubella (MMR) vaccine. You may need at least one dose of MMR. You may also need a second dose.  Pneumococcal 13-valent conjugate (PCV13) vaccine. You may need this if you have certain conditions and were not previously vaccinated.  Pneumococcal polysaccharide (PPSV23) vaccine. You may need one or two doses if you smoke cigarettes or if you have certain conditions.  Meningococcal vaccine. One dose is recommended if you are age 14-21 years and a first-year college student living in a residence hall, or if you have one of several medical conditions. You may also need additional booster doses.  Hepatitis A vaccine. You may need this if you have certain conditions or if you travel or work in places where you may be exposed to hepatitis A.  Hepatitis B vaccine. You may need this if you have certain conditions or if you travel or work in places where you may be exposed to hepatitis B.  Haemophilus influenzae type b (Hib) vaccine. You may need this if you have certain risk factors.  Talk to your health care provider about which screenings and vaccines you need and how often you need them. This information is not intended to replace advice given to you by your health care provider. Make sure you discuss any questions you have with your health care provider. Document Released: 11/12/2001 Document Revised: 06/05/2016 Document Reviewed: 07/18/2015 Elsevier Interactive Patient Education  [employer].

## 2018-03-13 NOTE — Progress Notes (Signed)
Pt is present today for annual exam and iud string check. Pt stated that she could feel the string but lately can not. Pt stated that she is doing her iud placement checks. Pt stated that she is doing well and have no concerns.

## 2018-03-13 NOTE — Progress Notes (Signed)
ANNUAL PREVENTATIVE CARE GYN  ENCOUNTER NOTE  Subjective:       Brenda Osborn is a 22 y.o. G0P0000 female here for a routine annual gynecologic exam.  Current complaints: 1. Vaginal odor 2. Vaginal discharge 3. Declines STI screening  Symptoms continue intermittently since IUD placement despite adequate hygiene. No relief with home treatment measures.   Denies difficulty breathing or respiratory distress, chest pain, abdominal pain, excessive vaginal bleeding, dysuria, and leg pain or swelling.    Gynecologic History  No LMP recorded. (Menstrual status: IUD).  Contraception: IUD, Mirena  Last Pap: today.   Obstetric History  OB History  Gravida Para Term Preterm AB Living  0 0 0 0 0 0  SAB TAB Ectopic Multiple Live Births  0 0 0 0 0    History reviewed. No pertinent past medical history.  Past Surgical History:  Procedure Laterality Date  . NO PAST SURGERIES      Current Outpatient Medications on File Prior to Visit  Medication Sig Dispense Refill  . Adapalene-Benzoyl Peroxide (EPIDUO) 0.1-2.5 % gel Apply topically at bedtime. 45 g 0  . levonorgestrel (MIRENA) 20 MCG/24HR IUD 1 each by Intrauterine route once.     No current facility-administered medications on file prior to visit.     Allergies  Allergen Reactions  . Zithromax [Azithromycin]     Respiratory Destress    Social History   Socioeconomic History  . Marital status: In a relationship    Spouse name: Not on file  . Number of children: Not on file  . Years of education: Not on file  . Highest education level: Not on file  Occupational History  . [employer]  Social Needs  . Financial resource strain: Not on file  . Food insecurity:    Worry: Not on file    Inability: Not on file  . Transportation needs:    Medical: Not on file    Non-medical: Not on file  Tobacco Use  . Smoking status: Never Smoker  . Smokeless tobacco: Never Used  Substance and Sexual Activity  . Alcohol use: No   . Drug use: No  . Sexual activity: Yes    Partners: Male    Birth control/protection: IUD    Comment: Mirena 05/02/2017  Lifestyle  . Physical activity:    Days per week: Not on file    Minutes per session: Not on file  . Stress: Not on file  Relationships  . Social connections:    Talks on phone: Not on file    Gets together: Not on file    Attends religious service: Not on file    Active member of club or organization: Not on file    Attends meetings of clubs or organizations: Not on file    Relationship status: Not on file  . Intimate partner violence:    Fear of current or ex partner: Not on file    Emotionally abused: Not on file    Physically abused: Not on file    Forced sexual activity: Not on file  Other Topics Concern  . Not on file  Social History Narrative  . Student: Sports administrator    Family History  Problem Relation Age of Onset  . Diabetes Mother        "Pre Diabetes"  . Healthy Sister   . Hypertension Maternal Grandmother   . Healthy Sister     The following portions of the patient's history were reviewed and updated as  appropriate: allergies, current medications, past family history, past medical history, past social history, past surgical history and problem list.  Review of Systems  ROS negative except as noted above. Information obtained from patient.    Objective:   BP 110/82   Pulse 88   Ht 5\' 9"  (1.753 m)   Wt 230 lb 12.8 oz (104.7 kg)   BMI 34.08 kg/m   CONSTITUTIONAL: Well-developed, well-nourished female in no acute distress.   PSYCHIATRIC: Normal mood and affect. Normal behavior. Normal judgment and thought content.  [place]: Alert and oriented to person, place, and time. Normal muscle tone coordination. No cranial nerve deficit noted.  HENT:  Normocephalic, atraumatic, External right and left ear normal.   EYES: Conjunctivae and EOM are normal. Pupils are equal and round.   NECK: Normal range of motion, supple,  no masses.  Normal thyroid.   SKIN: Skin is warm and dry. No rash noted. Not diaphoretic. No erythema. No pallor.  CARDIOVASCULAR: Normal heart rate noted, regular rhythm, no murmur.  RESPIRATORY: Clear to auscultation bilaterally. Effort and breath sounds normal, no problems with respiration noted.  BREASTS: Symmetric in size. No masses, skin changes, nipple drainage, or lymphadenopathy.  ABDOMEN: Soft, normal bowel sounds, no distention noted.  No tenderness, rebound or guarding. Obese.   PELVIC:  External Genitalia: Normal  Vagina: White, creamy discharge present  Cervix: Normal, IUD string present  Uterus: Normal  Adnexa: Normal   MUSCULOSKELETAL: Normal range of motion. No tenderness.  No cyanosis, clubbing, or edema.  2+ distal pulses.  LYMPHATIC: No Axillary, Supraclavicular, or Inguinal Adenopathy.  Assessment:   Annual gynecologic examination 22 y.o.   Contraception: IUD, Mirena   Obesity 1   Problem List Items Addressed This Visit    None    Visit Diagnoses    Well woman exam    -  Primary   Relevant Orders   NuSwab Vaginitis (VG)   Pap IG, rfx HPV ASCU,16/18   Screening for cervical cancer       Relevant Orders   Pap IG, rfx HPV ASCU,16/18   Vaginal odor       Relevant Orders   NuSwab Vaginitis (VG)   Vaginal discharge       Relevant Orders   NuSwab Vaginitis (VG)      Plan:   Pap: Pap, Reflex if ASCUS  Labs: by PCP   Routine preventative health maintenance measures emphasized: Exercise/Diet/Weight control, Tobacco Warnings, Alcohol/Substance use risks, Stress Management, Peer Pressure Issues and Safe Sex; see AVS  Reviewed red flag symptoms and when to call  RTC x 1 year for Annual Exam or sooner if needed   Diona Fanti, CNM [facility], [facility]

## 2018-03-17 LAB — NUSWAB VAGINITIS (VG)
CANDIDA ALBICANS, NAA: NEGATIVE
Candida glabrata, NAA: NEGATIVE
Trich vag by NAA: NEGATIVE

## 2018-03-18 ENCOUNTER — Ambulatory Visit (INDEPENDENT_AMBULATORY_CARE_PROVIDER_SITE_OTHER): Payer: Medicaid Other | Admitting: Physician Assistant

## 2018-03-18 ENCOUNTER — Encounter: Payer: Self-pay | Admitting: Physician Assistant

## 2018-03-18 VITALS — BP 126/78 | HR 88 | Temp 98.4°F | Resp 16 | Wt 233.0 lb

## 2018-03-18 DIAGNOSIS — R51 Headache: Secondary | ICD-10-CM | POA: Diagnosis not present

## 2018-03-18 DIAGNOSIS — R519 Headache, unspecified: Secondary | ICD-10-CM

## 2018-03-18 DIAGNOSIS — D229 Melanocytic nevi, unspecified: Secondary | ICD-10-CM | POA: Diagnosis not present

## 2018-03-18 LAB — PAP IG, RFX HPV ASCU,16/18: PAP Smear Comment: 0

## 2018-03-18 NOTE — Progress Notes (Signed)
       Patient: Brenda Osborn Female    DOB: 1996-03-31   22 y.o.   MRN: [id number] Visit Date: 03/18/2018  Today's Provider: Trinna Post, PA-C   Chief Complaint  Patient presents with  . Migraine    One month follow up   Subjective:   Headaches improved on B2 and magnesium oxide. Taking these daily. Took imitrex once and felt chest tightness and flushing. Was not contacted about dermatology referral.   Migraine   This is a recurrent problem. Episode frequency: Pt reports not having a "migraine" since starting B2 and Mag.  She still has some headaches, just not bad. The problem has been gradually improving. The pain is located in the bilateral region. The pain does not radiate. The pain quality is similar to prior headaches. The pain is at a severity of 0/10. The patient is experiencing no pain. Pertinent negatives include no dizziness.   Referral to [facility]     Allergies  Allergen Reactions  . Zithromax [Azithromycin]     Respiratory Destress     Current Outpatient Medications:  .  Adapalene-Benzoyl Peroxide (EPIDUO) 0.1-2.5 % gel, Apply topically at bedtime., Disp: 45 g, Rfl: 0 .  levonorgestrel (MIRENA) 20 MCG/24HR IUD, 1 each by Intrauterine route once., Disp: , Rfl:   Review of Systems  Constitutional: Negative.   Respiratory: Negative.   Cardiovascular: Negative.   Gastrointestinal: Negative.   Neurological: Negative for dizziness, light-headedness and headaches.    Social History   Tobacco Use  . Smoking status: Never Smoker  . Smokeless tobacco: Never Used  Substance Use Topics  . Alcohol use: No   Objective:   BP 126/78 (BP Location: Right Arm, Patient Position: Sitting, Cuff Size: Large)   Pulse 88   Temp 98.4 F (36.9 C) (Oral)   Resp 16   Wt 233 lb (105.7 kg)   BMI 34.41 kg/m  Vitals:   03/18/18 1620  BP: 126/78  Pulse: 88  Resp: 16  Temp: 98.4 F (36.9 C)  TempSrc: Oral  Weight: 233 lb (105.7 kg)     Physical Exam    Constitutional: She appears well-developed and well-nourished.  Cardiovascular: Normal rate and regular rhythm.  Pulmonary/Chest: Effort normal and breath sounds normal.  Skin: Skin is warm and dry.  Psychiatric: She has a normal mood and affect. Her behavior is normal.           Assessment & Plan:     1. Nonintractable headache, unspecified chronicity pattern, unspecified headache type  Improved on B2 and magnesium. Can continue these. Did not tolerate imitrex well, had discomfort and flushing. Can try another triptan if she desires but she has not had migraine like headache in the past month.  2. Nevus  Contacted sarah coley about the status of this referral, it has been placed but not scheduled.  Return if symptoms worsen or fail to improve.        Trinna Post, PA-C  [facility]

## 2018-03-18 NOTE — Patient Instructions (Signed)
Analgesic Rebound Headache An analgesic rebound headache, sometimes called a medication overuse headache, is a headache that comes after pain medicine (analgesic) taken to treat the original (primary) headache has worn off. Any type of primary headache can return as a rebound headache if a person regularly takes analgesics more than three times a week to treat it. The types of primary headaches that are commonly associated with rebound headaches include:  Migraines.  Headaches that arise from tense muscles in the head and neck area (tension headaches).  Headaches that develop and happen again (recur) on one side of the head and around the eye (cluster headaches).  If rebound headaches continue, they become chronic daily headaches. What are the causes? This condition may be caused by frequent use of:  Over-the-counter medicines such as aspirin, ibuprofen, and acetaminophen.  Sinus relief medicines and other medicines that contain caffeine.  Narcotic pain medicines such as codeine and oxycodone.  What are the signs or symptoms? The symptoms of a rebound headache are the same as the symptoms of the original headache. Some of the symptoms of specific types of headaches include: Migraine headache  Pulsing or throbbing pain on one or both sides of the head.  Severe pain that interferes with daily activities.  Pain that is worsened by physical activity.  Nausea, vomiting, or both.  Pain with exposure to bright light, loud noises, or strong smells.  General sensitivity to bright light, loud noises, or strong smells.  Visual changes.  Numbness of one or both arms. Tension headache  Pressure around the head.  Dull, aching head pain.  Pain felt over the front and sides of the head.  Tenderness in the muscles of the head, neck, and shoulders. Cluster headache  Severe pain that begins in or around one eye or temple.  Redness and tearing in the eye on the same side as the  pain.  Droopy or swollen eyelid.  One-sided head pain.  Nausea.  Runny nose.  Sweaty, pale facial skin.  Restlessness. How is this diagnosed? This condition is diagnosed by:  Reviewing your medical history. This includes the nature of your primary headaches.  Reviewing the types of pain medicines that you have been using to treat your headaches and how often you take them.  How is this treated? This condition may be treated or managed by:  Discontinuing frequent use of the analgesic medicine. Doing this may worsen your headaches at first, but the pain should eventually become more manageable, less frequent, and less severe.  Seeing a headache specialist. He or she may be able to help you manage your headaches and help make sure there is not another cause of the headaches.  Using methods of stress relief, such as acupuncture, counseling, biofeedback, and massage. Talk with your health care provider about which methods might be good for you.  Follow these instructions at home:  Take over-the-counter and prescription medicines only as told by your health care provider.  Stop the repeated use of pain medicine as told by your health care provider. Stopping can be difficult. Carefully follow instructions from your health care provider.  Avoid triggers that are known to cause your primary headaches.  Keep all follow-up visits as told by your health care provider. This is important. Contact a health care provider if:  You continue to experience headaches after following treatments that your health care provider recommended. Get help right away if:  You develop new headache pain.  You develop headache pain that is different   than what you have experienced in the past.  You develop numbness or tingling in your arms or legs.  You develop changes in your speech or vision. This information is not intended to replace advice given to you by your health care provider. Make sure you  discuss any questions you have with your health care provider. Document Released: 12/07/2003 Document Revised: 04/05/2016 Document Reviewed: 02/19/2016 Elsevier Interactive Patient Education  2018 Elsevier Inc.  

## 2018-03-19 ENCOUNTER — Telehealth: Payer: Self-pay | Admitting: Physician Assistant

## 2018-03-19 NOTE — Telephone Encounter (Signed)
Brenda Osborn will not schedule appointment for pt because she has [facility] listed as PCP on Medicaid card.Pt advised of this and advised her to contact office once this has been taken care of

## 2018-03-23 NOTE — Telephone Encounter (Signed)
Noted, thanks. This will likely be the case with most providers. She shouldn't need a new referral just to change her card.

## 2018-08-25 ENCOUNTER — Ambulatory Visit
Admission: RE | Admit: 2018-08-25 | Discharge: 2018-08-25 | Disposition: A | Discharge status: Home / Self Care | Payer: BLUE CROSS/BLUE SHIELD | Source: Ambulatory Visit | Attending: Physician Assistant | Admitting: Physician Assistant

## 2018-08-25 ENCOUNTER — Other Ambulatory Visit: Payer: Self-pay | Admitting: Physician Assistant

## 2018-08-25 DIAGNOSIS — W19XXXA Unspecified fall, initial encounter: Secondary | ICD-10-CM | POA: Diagnosis not present

## 2018-08-25 DIAGNOSIS — T1490XA Injury, unspecified, initial encounter: Secondary | ICD-10-CM

## 2018-08-25 DIAGNOSIS — R52 Pain, unspecified: Secondary | ICD-10-CM

## 2018-08-25 DIAGNOSIS — M25511 Pain in right shoulder: Secondary | ICD-10-CM | POA: Insufficient documentation

## 2018-09-03 ENCOUNTER — Encounter: Payer: Self-pay | Admitting: Certified Nurse Midwife

## 2018-09-03 ENCOUNTER — Ambulatory Visit: Payer: BLUE CROSS/BLUE SHIELD | Admitting: Certified Nurse Midwife

## 2018-09-03 VITALS — BP 117/84 | HR 86 | Ht 68.0 in | Wt 232.1 lb

## 2018-09-03 DIAGNOSIS — T148XXA Other injury of unspecified body region, initial encounter: Secondary | ICD-10-CM

## 2018-09-03 DIAGNOSIS — I863 Vulval varices: Secondary | ICD-10-CM

## 2018-09-03 DIAGNOSIS — N898 Other specified noninflammatory disorders of vagina: Secondary | ICD-10-CM | POA: Diagnosis not present

## 2018-09-03 NOTE — Progress Notes (Signed)
Patient c/o "marble size" vaginal lump that she noticed 2 weeks ago, denies pain or discomfort, no drainage.

## 2018-09-03 NOTE — Patient Instructions (Addendum)
Witch Hazel topical solution wipes What is this medicine? WITCH HAZEL [facility] hey zuhl) is a botanical astringent from the plant [facility]. The wipes and pads are used to relieve itching, burning, and irritation caused by hemorrhoids or bowel movements. They may also be used to clean the outer vaginal area after childbirth or the rectal area following rectal surgery. This medicine may be used for other purposes; ask your health care provider or pharmacist if you have questions. COMMON BRAND NAME(S): Hemorrhoidal, Medi-Pads, Preparation H Maximum Strength, Preparation H Totables, Sani-Pads with Aloe, Tucks What should I tell my health care provider before I take this medicine? They need to know if you have any of these conditions: -bleeding in the treated area -an unusual or allergic reaction to witch hazel, other medicines, foods, dyes, or preservatives How should I use this medicine? This medicine is for external use only. Follow the directions on the package label or the advice of your doctor or health care professional. Do not use your medicine more often than directed. Talk to your pediatrician regarding the use of this medicine in children. While this drug may be used for children as young as 12 years for selected conditions, precautions do apply. Overdosage: If you think you have taken too much of this medicine contact a poison control center or emergency room at once. NOTE: This medicine is only for you. Do not share this medicine with others. What if I miss a dose? This does not apply; the pads or wipes may be used as needed, up to 6 times per day. What may interact with this medicine? Interactions are not expected. Do not use any other skin products on the same area of skin without asking your doctor or health care professional. This list may not describe all possible interactions. Give your health care provider a list of all the medicines, herbs, non-prescription drugs, or  dietary supplements you use. Also tell them if you smoke, drink alcohol, or use illegal drugs. Some items may interact with your medicine. What should I watch for while using this medicine? Tell your doctor or healthcare professional if your symptoms do not start to get better or if they get worse. Many pads and wipes are safe for septic and sewer system disposal. Check specific product label. What side effects may I notice from receiving this medicine? Side effects that you should report to your doctor or health care professional as soon as possible: -allergic reactions like skin rash, itching or hives, swelling of the face, lips, or tongue Side effects that usually do not require medical attention (report to your doctor or health care professional if they continue or are bothersome): -mild skin dryness or irritation This list may not describe all possible side effects. Call your doctor for medical advice about side effects. You may report side effects to FDA at 1-800-FDA-1088. Where should I keep my medicine? Keep out of the reach of children. Store at room temperature between 15 and 30 degrees C (59 and 86 degrees F). Throw away any unused medicine after the expiration date. NOTE: This sheet is a summary. It may not cover all possible information. If you have questions about this medicine, talk to your doctor, pharmacist, or health care provider.  2018 Elsevier/Gold Standard (2015-10-19 08:48:35) Varicose Veins Varicose veins are veins that have become enlarged and twisted. They are usually seen in the legs but can occur in other parts of the body as well. What are the causes? This condition is the  result of valves in the veins not working properly. Valves in the veins help to return blood from the leg to the heart. If these valves are damaged, blood flows backward and backs up into the veins in the leg near the skin. This causes the veins to become larger. What increases the risk? People who  are on their feet a lot, who are pregnant, or who are overweight are more likely to develop varicose veins. What are the signs or symptoms?  Bulging, twisted-appearing, bluish veins, most commonly found on the legs.  Leg pain or a feeling of heaviness. These symptoms may be worse at the end of the day.  Leg swelling.  Changes in skin color. How is this diagnosed? A health care provider can usually diagnose varicose veins by examining your legs. Your health care provider may also recommend an ultrasound of your leg veins. How is this treated? Most varicose veins can be treated at home.However, other treatments are available for people who have persistent symptoms or want to improve the cosmetic appearance of the varicose veins. These treatment options include:  Sclerotherapy. A solution is injected into the vein to close it off.  Laser treatment. A laser is used to heat the vein to close it off.  Radiofrequency vein ablation. An electrical current produced by radio waves is used to close off the vein.  Phlebectomy. The vein is surgically removed through small incisions made over the varicose vein.  Vein ligation and stripping. The vein is surgically removed through incisions made over the varicose vein after the vein has been tied (ligated).  Follow these instructions at home:  Do not stand or sit in one position for long periods of time. Do not sit with your legs crossed. Rest with your legs raised during the day.  Wear compression stockings as directed by your health care provider. These stockings help to prevent blood clots and reduce swelling in your legs.  Do not wear other tight, encircling garments around your legs, pelvis, or waist.  Walk as much as possible to increase blood flow.  Raise the foot of your bed at night with 2-inch blocks.  If you get a cut in the skin over the vein and the vein bleeds, lie down with your leg raised and press on it with a clean cloth until  the bleeding stops. Then place a bandage (dressing) on the cut. See your health care provider if it continues to bleed. Contact a health care provider if:  The skin around your ankle starts to break down.  You have pain, redness, tenderness, or hard swelling in your leg over a vein.  You are uncomfortable because of leg pain. This information is not intended to replace advice given to you by your health care provider. Make sure you discuss any questions you have with your health care provider. Document Released: 06/26/2005 Document Revised: 02/22/2016 Document Reviewed: 03/19/2016 Elsevier Interactive Patient Education  2017 [employer]. How to Take a [employer] A sitz bath is a warm water bath that is taken while you are sitting down. The water should only come up to your hips and should cover your buttocks. Your health care provider may recommend a sitz bath to help you:  Clean the lower part of your body, including your genital area.  With itching.  With pain.  With sore muscles or muscles that tighten or spasm.  How to take a sitz bath Take 3-4 sitz baths per day or as told by your  health care provider. 1. Partially fill a bathtub with warm water. You will only need the water to be deep enough to cover your hips and buttocks when you are sitting in it. 2. If your health care provider told you to put medicine in the water, follow the directions exactly. 3. Sit in the water and open the tub drain a little. 4. Turn on the warm water again to keep the tub at the correct level. Keep the water running constantly. 5. Soak in the water for 15-20 minutes or as told by your health care provider. 6. After the sitz bath, pat the affected area dry first. Do not rub it. 7. Be careful when you stand up after the sitz bath because you may feel dizzy.  Contact a health care provider if:  Your symptoms get worse. Do not continue with sitz baths if your symptoms get worse.  You have new  symptoms. Do not continue with sitz baths until you talk with your health care provider. This information is not intended to replace advice given to you by your health care provider. Make sure you discuss any questions you have with your health care provider. Document Released: 06/08/2004 Document Revised: 02/14/2016 Document Reviewed: 09/14/2014 Elsevier Interactive Patient Education  2018 [place] A disposable sitz bath is a plastic basin that fits over the toilet. A bag is hung above the toilet, and the bag is connected to a tube that opens into the basin. The bag is filled with warm water that flows into the basin through the tube. A sitz bath can be used to help relieve symptoms, clean, and promote healing in the genital and anal areas, as well as in the lower abdomen and buttocks. What are the risks? Sitz baths are generally very safe. It is possible for the skin between the genitals and the anus (perineum) to become infected, but this is rare. You can avoid this by cleaning your sitz bath supplies thoroughly. How to use a disposable sitz bath 1. Close the clamp on the tube. Make sure the clamp is closed tightly to prevent leakage. 2. Fill the sitz bath basin and the plastic bag with warm water. The water should be warm enough to be comfortable, but not hot. 3. Raise the toilet seat and place the filled basin on the toilet. Make sure the overflow opening is facing toward the back of the toilet. ? If you prefer, you may place the empty basin on the toilet first, and then use the plastic bag to fill the basin with warm water. 4. Hang the filled plastic bag overhead on a hook or towel rack close to the toilet. The bag should be higher than the toilet so that the water will flow down through the tube. 5. Attach the tube to the opening on the basin. Make sure that the tube is attached to the basin tightly to prevent leakage. 6. Sit on the basin and release the clamp. This  will allow warm water to flow into the basin and flush the area around your genitals and anus. 7. Remain sitting on the basin for about 15-20 minutes, or as long as told by your health care provider. 8. Stand up and gently pat your skin dry. If directed, apply clean bandages (dressings) to the affected area as told by your health care provider. 9. Carefully remove the basin from the toilet seat and tip the basin into the toilet to empty any remaining water. Empty any remaining  water from the plastic bag into the toilet. Then, flush the toilet. 10. Wash the basin with warm water and soap. Let the basin air dry in the sink. You should also let the plastic bag and the tubing air dry. 11. Store the basin, tubing, and plastic bag in a clean, dry area. 12. Wash your hands with soap and water. If soap and water are not available, use hand sanitizer. Contact a health care provider if:  You have symptoms that get worse instead of better.  You develop new skin irritation, redness, or swelling around your genitals or anus. This information is not intended to replace advice given to you by your health care provider. Make sure you discuss any questions you have with your health care provider. Document Released: 03/17/2012 Document Revised: 02/22/2016 Document Reviewed: 08/06/2015 Elsevier Interactive Patient Education  [employer].

## 2018-09-03 NOTE — Progress Notes (Signed)
GYN ENCOUNTER NOTE  Subjective:       Brenda Osborn is a 22 y.o. G0P0000 female here for gynecologic evaluation of the following issues:  1. Marble size vaginal lump x 2 weeks, no relief with home treatment measures. No pain, discomfort or discharge.   Denies difficulty breathing or respiratory distress, chest pain, abdominal pain, excessive vaginal bleeding, dysuria, and leg pain or swelling.    Gynecologic History  No LMP recorded. (Menstrual status: IUD).  Contraception: IUD, Mirena  Last Pap: 2019. Results were: normal   Obstetric History  OB History  Gravida Para Term Preterm AB Living  0 0 0 0 0 0  SAB TAB Ectopic Multiple Live Births  0 0 0 0 0    No past medical history on file.  Past Surgical History:  Procedure Laterality Date  . NO PAST SURGERIES      Current Outpatient Medications on File Prior to Visit  Medication Sig Dispense Refill  . levonorgestrel (MIRENA) 20 MCG/24HR IUD 1 each by Intrauterine route once.     No current facility-administered medications on file prior to visit.     Allergies  Allergen Reactions  . Zithromax [Azithromycin]     Respiratory Destress    Social History   Socioeconomic History  . Marital status: Single    Spouse name: Not on file  . Number of children: Not on file  . Years of education: Not on file  . Highest education level: Not on file  Occupational History  . Not on file  Social Needs  . Financial resource strain: Not on file  . Food insecurity:    Worry: Not on file    Inability: Not on file  . Transportation needs:    Medical: Not on file    Non-medical: Not on file  Tobacco Use  . Smoking status: Never Smoker  . Smokeless tobacco: Never Used  Substance and Sexual Activity  . Alcohol use: No  . Drug use: No  . Sexual activity: Yes    Partners: Male    Birth control/protection: IUD    Comment: Mirena 05/02/2017  Lifestyle  . Physical activity:    Days per week: Not on file    Minutes per  session: Not on file  . Stress: Not on file  Relationships  . Social connections:    Talks on phone: Not on file    Gets together: Not on file    Attends religious service: Not on file    Active member of club or organization: Not on file    Attends meetings of clubs or organizations: Not on file    Relationship status: Not on file  . Intimate partner violence:    Fear of current or ex partner: Not on file    Emotionally abused: Not on file    Physically abused: Not on file    Forced sexual activity: Not on file  Other Topics Concern  . Not on file  Social History Narrative  . Not on file    Family History  Problem Relation Age of Onset  . Diabetes Mother        "Pre Diabetes"  . Healthy Sister   . Hypertension Maternal Grandmother   . Healthy Sister   . Breast cancer Neg Hx   . Ovarian cancer Neg Hx   . Colon cancer Neg Hx     The following portions of the patient's history were reviewed and updated as appropriate: allergies, current medications, past family  history, past medical history, past social history, past surgical history and problem list.  Review of Systems  ROS negative except as noted above. Information obtained from patient.   Objective:   BP 117/84   Pulse 86   Ht 5\' 8"  (1.727 m)   Wt 232 lb 1.6 oz (105.3 kg)   BMI 35.29 kg/m    CONSTITUTIONAL: Well-developed, well-nourished female in no acute distress.   PELVIC:  One (1) cm blood fluid cyst to right labial minora visualized.     MUSCULOSKELETAL: Normal range of motion. No tenderness.  No cyanosis, clubbing, or edema.   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 Hematoma   Plan:   Appropriate time out taken. Site identified.  Area prepped in usual sterile fashon. One cc of 2% lidocaine was used to anesthetize the area. A small stab incision was made in the center of the cyst and blood was drained.There was less than 3 cc blood loss. There were no complications.  The  patient tolerated the procedure well.  Discussed home treatment measures.   Reviewed red flag symptoms and when to call.   RTC as needed.    Diona Fanti, CNM [facility], [facility]

## 2019-03-15 ENCOUNTER — Encounter: Payer: Medicaid Other | Admitting: Certified Nurse Midwife

## 2019-03-15 ENCOUNTER — Encounter: Payer: BLUE CROSS/BLUE SHIELD | Admitting: Certified Nurse Midwife

## 2019-04-09 ENCOUNTER — Other Ambulatory Visit (HOSPITAL_COMMUNITY)
Admission: RE | Admit: 2019-04-09 | Discharge: 2019-04-09 | Disposition: A | Discharge status: Home / Self Care | Payer: Managed Care, Other (non HMO) | Source: Ambulatory Visit | Attending: Certified Nurse Midwife | Admitting: Certified Nurse Midwife

## 2019-04-09 ENCOUNTER — Encounter: Payer: Self-pay | Admitting: Certified Nurse Midwife

## 2019-04-09 ENCOUNTER — Other Ambulatory Visit: Payer: Self-pay

## 2019-04-09 ENCOUNTER — Ambulatory Visit (INDEPENDENT_AMBULATORY_CARE_PROVIDER_SITE_OTHER): Payer: Managed Care, Other (non HMO) | Admitting: Certified Nurse Midwife

## 2019-04-09 VITALS — BP 106/79 | HR 83 | Ht 68.0 in | Wt 228.0 lb

## 2019-04-09 DIAGNOSIS — N946 Dysmenorrhea, unspecified: Secondary | ICD-10-CM

## 2019-04-09 DIAGNOSIS — N920 Excessive and frequent menstruation with regular cycle: Secondary | ICD-10-CM | POA: Diagnosis not present

## 2019-04-09 DIAGNOSIS — N921 Excessive and frequent menstruation with irregular cycle: Secondary | ICD-10-CM

## 2019-04-09 DIAGNOSIS — Z01419 Encounter for gynecological examination (general) (routine) without abnormal findings: Secondary | ICD-10-CM | POA: Insufficient documentation

## 2019-04-09 DIAGNOSIS — Z975 Presence of (intrauterine) contraceptive device: Secondary | ICD-10-CM | POA: Diagnosis present

## 2019-04-09 NOTE — Progress Notes (Signed)
ANNUAL PREVENTATIVE CARE GYN  ENCOUNTER NOTE  Subjective:       Brenda Osborn is a 23 y.o. G0P0000 female here for a routine annual gynecologic exam.  Current complaints: 1. Breakthrough bleeding with IUD 2. Dysmenorrhea  Reports return of menses approximately four (4) months ago and painful periods. Has two (2) friends who pregnancy with IUDs in place and are concerned.   Getting married in October 2020. Denies difficulty breathing or respiratory distress, chest pain, abdominal pain, excessive vaginal bleeding, dysuria, and leg pain or swelling.    Gynecologic History  No LMP recorded. (Menstrual status: IUD).  Contraception: IUD, Mirena  Last Pap: 02/2018. Results were: normal  Obstetric History  OB History  Gravida Para Term Preterm AB Living  0 0 0 0 0 0  SAB TAB Ectopic Multiple Live Births  0 0 0 0 0    No past medical history on file.  Past Surgical History:  Procedure Laterality Date  . NO PAST SURGERIES      Current Outpatient Medications on File Prior to Visit  Medication Sig Dispense Refill  . levonorgestrel (MIRENA) 20 MCG/24HR IUD 1 each by Intrauterine route once.     No current facility-administered medications on file prior to visit.     Allergies  Allergen Reactions  . Zithromax [Azithromycin]     Respiratory Destress    Social History   Socioeconomic History  . Marital status: Single    Spouse name: Not on file  . Number of children: Not on file  . Years of education: Not on file  . Highest education level: Not on file  Occupational History  . Not on file  Social Needs  . Financial resource strain: Not on file  . Food insecurity    Worry: Not on file    Inability: Not on file  . Transportation needs    Medical: Not on file    Non-medical: Not on file  Tobacco Use  . Smoking status: Never Smoker  . Smokeless tobacco: Never Used  Substance and Sexual Activity  . Alcohol use: No  . Drug use: No  . Sexual activity: Yes   Partners: Male    Birth control/protection: I.U.D.    Comment: Mirena 05/02/2017  Lifestyle  . Physical activity    Days per week: Not on file    Minutes per session: Not on file  . Stress: Not on file  Relationships  . Social Herbalist on phone: Not on file    Gets together: Not on file    Attends religious service: Not on file    Active member of club or organization: Not on file    Attends meetings of clubs or organizations: Not on file    Relationship status: Not on file  . Intimate partner violence    Fear of current or ex partner: Not on file    Emotionally abused: Not on file    Physically abused: Not on file    Forced sexual activity: Not on file  Other Topics Concern  . Not on file  Social History Narrative  . Not on file    Family History  Problem Relation Age of Onset  . Diabetes Mother        "Pre Diabetes"  . Healthy Sister   . Hypertension Maternal Grandmother   . Healthy Sister   . Breast cancer Neg Hx   . Ovarian cancer Neg Hx   . Colon cancer Neg Hx  The following portions of the patient's history were reviewed and updated as appropriate: allergies, current medications, past family history, past medical history, past social history, past surgical history and problem list.  Review of Systems  ROS negative except as noted above. Information obtained from patient.   Objective:   BP 106/79   Pulse 83   Ht 5\' 8"  (1.727 m)   Wt 228 lb (103.4 kg)   BMI 34.67 kg/m   CONSTITUTIONAL: Well-developed, well-nourished female in no acute distress.   PSYCHIATRIC: Normal mood and affect. Normal behavior. Normal judgment and thought content.  [place]: Alert and oriented to person, place, and time. Normal muscle tone coordination. No cranial nerve deficit noted.  HENT:  Normocephalic, atraumatic, External right and left ear normal.   EYES: Conjunctivae and EOM are normal. Pupils are equal and round.   NECK: Normal range of motion, supple, no  masses.  Normal thyroid.   SKIN: Skin is warm and dry. No rash noted. Not diaphoretic. No erythema. No pallor.  CARDIOVASCULAR: Normal heart rate noted, regular rhythm, no murmur.  RESPIRATORY: Clear to auscultation bilaterally. Effort and breath sounds normal, no problems with respiration noted.  BREASTS: Symmetric in size. No masses, skin changes, nipple drainage, or lymphadenopathy.  ABDOMEN: Soft, normal bowel sounds, no distention noted.  No tenderness, rebound or guarding.   PELVIC:  External Genitalia: Normal  Vagina: Normal  Cervix: Normal, IUD strings present  Uterus: Normal  Adnexa: Normal  MUSCULOSKELETAL: Normal range of motion. No tenderness.  No cyanosis, clubbing, or edema.  2+ distal pulses.  LYMPHATIC: No Axillary, Supraclavicular, or Inguinal Adenopathy.  Assessment:   Annual gynecologic examination 23 y.o.   Contraception: IUD, Mirena   Obesity 1   Problem List Items Addressed This Visit    None    Visit Diagnoses    Well woman exam    -  Primary   Relevant Orders   Cervicovaginal ancillary only   Breakthrough bleeding associated with intrauterine device (IUD)       Relevant Orders   US PELVIS TRANSVAGINAL NON-OB (TV ONLY)   Cervicovaginal ancillary only   Dysmenorrhea       Relevant Orders   US PELVIS TRANSVAGINAL NON-OB (TV ONLY)   Spotting       Relevant Orders   Cervicovaginal ancillary only      Plan:   Pap: Not needed  Labs: Declined   Routine preventative health maintenance measures emphasized: Exercise/Diet/Weight control, Tobacco Warnings, Alcohol/Substance use risks, Stress Management and Peer Pressure Issues; See AVS  Reviewed red flag symptoms and when to call  RTC for ultrasound to verify IUD location, see orders  RTC x 1 year for ANNUAL EXAM or sooner if needed   Diona Fanti, CNM [facility], [facility] 04/09/19 5:29 PM

## 2019-04-09 NOTE — Patient Instructions (Signed)
Levonorgestrel intrauterine device (IUD) What is this medicine? LEVONORGESTREL IUD (LEE voe nor jes trel) is a contraceptive (birth control) device. The device is placed inside the uterus by a healthcare professional. It is used to prevent pregnancy. This device can also be used to treat heavy bleeding that occurs during your period. This medicine may be used for other purposes; ask your health care provider or pharmacist if you have questions. COMMON BRAND NAME(S): Minette Headland What should I tell my health care provider before I take this medicine? They need to know if you have any of these conditions:  abnormal Pap smear  cancer of the breast, uterus, or cervix  diabetes  endometritis  genital or pelvic infection now or in the past  have more than one sexual partner or your partner has more than one partner  heart disease  history of an ectopic or tubal pregnancy  immune system problems  IUD in place  liver disease or tumor  problems with blood clots or take blood-thinners  seizures  use intravenous drugs  uterus of unusual shape  vaginal bleeding that has not been explained  an unusual or allergic reaction to levonorgestrel, other hormones, silicone, or polyethylene, medicines, foods, dyes, or preservatives  pregnant or trying to get pregnant  breast-feeding How should I use this medicine? This device is placed inside the uterus by a health care professional. Talk to your pediatrician regarding the use of this medicine in children. Special care may be needed. Overdosage: If you think you have taken too much of this medicine contact a poison control center or emergency room at once. NOTE: This medicine is only for you. Do not share this medicine with others. What if I miss a dose? This does not apply. Depending on the brand of device you have inserted, the device will need to be replaced every 3 to 6 years if you wish to continue using this type  of birth control. What may interact with this medicine? Do not take this medicine with any of the following medications:  amprenavir  bosentan  fosamprenavir This medicine may also interact with the following medications:  aprepitant  armodafinil  barbiturate medicines for inducing sleep or treating seizures  bexarotene  boceprevir  griseofulvin  medicines to treat seizures like carbamazepine, ethotoin, felbamate, oxcarbazepine, phenytoin, topiramate  modafinil  pioglitazone  rifabutin  rifampin  rifapentine  some medicines to treat HIV infection like atazanavir, efavirenz, indinavir, lopinavir, nelfinavir, tipranavir, ritonavir  St. John's wort  warfarin This list may not describe all possible interactions. Give your health care provider a list of all the medicines, herbs, non-prescription drugs, or dietary supplements you use. Also tell them if you smoke, drink alcohol, or use illegal drugs. Some items may interact with your medicine. What should I watch for while using this medicine? Visit your doctor or health care professional for regular check ups. See your doctor if you or your partner has sexual contact with others, becomes HIV positive, or gets a sexual transmitted disease. This product does not protect you against HIV infection (AIDS) or other sexually transmitted diseases. You can check the placement of the IUD yourself by reaching up to the top of your vagina with clean fingers to feel the threads. Do not pull on the threads. It is a good habit to check placement after each menstrual period. Call your doctor right away if you feel more of the IUD than just the threads or if you cannot feel the threads at  all. The IUD may come out by itself. You may become pregnant if the device comes out. If you notice that the IUD has come out use a backup birth control method like condoms and call your health care provider. Using tampons will not change the position of the  IUD and are okay to use during your period. This IUD can be safely scanned with magnetic resonance imaging (MRI) only under specific conditions. Before you have an MRI, tell your healthcare provider that you have an IUD in place, and which type of IUD you have in place. What side effects may I notice from receiving this medicine? Side effects that you should report to your doctor or health care professional as soon as possible:  allergic reactions like skin rash, itching or hives, swelling of the face, lips, or tongue  fever, flu-like symptoms  genital sores  high blood pressure  no menstrual period for 6 weeks during use  pain, swelling, warmth in the leg  pelvic pain or tenderness  severe or sudden headache  signs of pregnancy  stomach cramping  sudden shortness of breath  trouble with balance, talking, or walking  unusual vaginal bleeding, discharge  yellowing of the eyes or skin Side effects that usually do not require medical attention (report to your doctor or health care professional if they continue or are bothersome):  acne  breast pain  change in sex drive or performance  changes in weight  cramping, dizziness, or faintness while the device is being inserted  headache  irregular menstrual bleeding within first 3 to 6 months of use  nausea This list may not describe all possible side effects. Call your doctor for medical advice about side effects. You may report side effects to FDA at 1-800-FDA-1088. Where should I keep my medicine? This does not apply. NOTE: This sheet is a summary. It may not cover all possible information. If you have questions about this medicine, talk to your doctor, pharmacist, or health care provider.  2020 Elsevier/Gold Standard (2018-07-28 13:22:01)   Preventive Care 15-33 Years Old, Female Preventive care refers to visits with your health care provider and lifestyle choices that can promote health and wellness. This  includes:  A yearly physical exam. This may also be called an annual well check.  Regular dental visits and eye exams.  Immunizations.  Screening for certain conditions.  Healthy lifestyle choices, such as eating a healthy diet, getting regular exercise, not using drugs or products that contain nicotine and tobacco, and limiting alcohol use. What can I expect for my preventive care visit? Physical exam Your health care provider will check your:  Height and weight. This may be used to calculate body mass index (BMI), which tells if you are at a healthy weight.  Heart rate and blood pressure.  Skin for abnormal spots. Counseling Your health care provider may ask you questions about your:  Alcohol, tobacco, and drug use.  Emotional well-being.  Home and relationship well-being.  Sexual activity.  Eating habits.  Work and work Statistician.  Method of birth control.  Menstrual cycle.  Pregnancy history. What immunizations do I need?  Influenza (flu) vaccine  This is recommended every year. Tetanus, diphtheria, and pertussis (Tdap) vaccine  You may need a Td booster every 10 years. Varicella (chickenpox) vaccine  You may need this if you have not been vaccinated. Human papillomavirus (HPV) vaccine  If recommended by your health care provider, you may need three doses over 6 months. Measles, mumps, and  rubella (MMR) vaccine  You may need at least one dose of MMR. You may also need a second dose. Meningococcal conjugate (MenACWY) vaccine  One dose is recommended if you are age 74-21 years and a first-year college student living in a residence hall, or if you have one of several medical conditions. You may also need additional booster doses. Pneumococcal conjugate (PCV13) vaccine  You may need this if you have certain conditions and were not previously vaccinated. Pneumococcal polysaccharide (PPSV23) vaccine  You may need one or two doses if you smoke  cigarettes or if you have certain conditions. Hepatitis A vaccine  You may need this if you have certain conditions or if you travel or work in places where you may be exposed to hepatitis A. Hepatitis B vaccine  You may need this if you have certain conditions or if you travel or work in places where you may be exposed to hepatitis B. Haemophilus influenzae type b (Hib) vaccine  You may need this if you have certain conditions. You may receive vaccines as individual doses or as more than one vaccine together in one shot (combination vaccines). Talk with your health care provider about the risks and benefits of combination vaccines. What tests do I need?  Blood tests  Lipid and cholesterol levels. These may be checked every 5 years starting at age 45.  Hepatitis C test.  Hepatitis B test. Screening  Diabetes screening. This is done by checking your blood sugar (glucose) after you have not eaten for a while (fasting).  Sexually transmitted disease (STD) testing.  BRCA-related cancer screening. This may be done if you have a family history of breast, ovarian, tubal, or peritoneal cancers.  Pelvic exam and Pap test. This may be done every 3 years starting at age 51. Starting at age 46, this may be done every 5 years if you have a Pap test in combination with an HPV test. Talk with your health care provider about your test results, treatment options, and if necessary, the need for more tests. Follow these instructions at home: Eating and drinking   Eat a diet that includes fresh fruits and vegetables, whole grains, lean protein, and low-fat dairy.  Take vitamin and mineral supplements as recommended by your health care provider.  Do not drink alcohol if: ? Your health care provider tells you not to drink. ? You are pregnant, may be pregnant, or are planning to become pregnant.  If you drink alcohol: ? Limit how much you have to 0-1 drink a day. ? Be aware of how much alcohol  is in your drink. In the [place] one drink equals one 12 oz bottle of beer (355 mL), one 5 oz glass of wine (148 mL), or one 1 oz glass of hard liquor (44 mL). Lifestyle  Take daily care of your teeth and gums.  Stay active. Exercise for at least 30 minutes on 5 or more days each week.  Do not use any products that contain nicotine or tobacco, such as cigarettes, e-cigarettes, and chewing tobacco. If you need help quitting, ask your health care provider.  If you are sexually active, practice safe sex. Use a condom or other form of birth control (contraception) in order to prevent pregnancy and STIs (sexually transmitted infections). If you plan to become pregnant, see your health care provider for a preconception visit. What's next?  Visit your health care provider once a year for a well check visit.  Ask your health care provider how  often you should have your eyes and teeth checked.  Stay up to date on all vaccines. This information is not intended to replace advice given to you by your health care provider. Make sure you discuss any questions you have with your health care provider. Document Released: 11/12/2001 Document Revised: 05/28/2018 Document Reviewed: 05/28/2018 Elsevier Patient Education  2020 [employer].

## 2019-04-14 ENCOUNTER — Other Ambulatory Visit: Payer: Self-pay

## 2019-04-14 ENCOUNTER — Ambulatory Visit (INDEPENDENT_AMBULATORY_CARE_PROVIDER_SITE_OTHER): Payer: Managed Care, Other (non HMO)

## 2019-04-14 DIAGNOSIS — N921 Excessive and frequent menstruation with irregular cycle: Secondary | ICD-10-CM

## 2019-04-14 DIAGNOSIS — N946 Dysmenorrhea, unspecified: Secondary | ICD-10-CM | POA: Diagnosis not present

## 2019-04-14 DIAGNOSIS — Z975 Presence of (intrauterine) contraceptive device: Secondary | ICD-10-CM | POA: Diagnosis not present

## 2019-04-14 LAB — CERVICOVAGINAL ANCILLARY ONLY
Bacterial vaginitis: NEGATIVE
Candida vaginitis: NEGATIVE
Trichomonas: NEGATIVE

## 2019-04-20 ENCOUNTER — Telehealth: Payer: Self-pay | Admitting: Certified Nurse Midwife

## 2019-04-20 NOTE — Telephone Encounter (Signed)
The patient called and stated that she had an ultrasound last week and she has not received a call to go over the u/s. Pt is requesting a call back from nurse or provider w/ update or results. Please advise.

## 2019-04-20 NOTE — Telephone Encounter (Signed)
Patient inquiring about 7/15 ultrasound result.  Should she come in to discuss?  I'm not able to see the report.  Thanks. Jennye Moccasin

## 2019-05-27 ENCOUNTER — Emergency Department
Admission: EM | Admit: 2019-05-27 | Discharge: 2019-05-27 | Disposition: A | Discharge status: Home / Self Care | Payer: Managed Care, Other (non HMO) | Attending: Emergency Medicine | Admitting: Emergency Medicine

## 2019-05-27 ENCOUNTER — Encounter: Payer: Self-pay | Admitting: Emergency Medicine

## 2019-05-27 ENCOUNTER — Other Ambulatory Visit: Payer: Self-pay

## 2019-05-27 DIAGNOSIS — Z79899 Other long term (current) drug therapy: Secondary | ICD-10-CM | POA: Insufficient documentation

## 2019-05-27 DIAGNOSIS — Z20828 Contact with and (suspected) exposure to other viral communicable diseases: Secondary | ICD-10-CM | POA: Diagnosis not present

## 2019-05-27 DIAGNOSIS — Z20822 Contact with and (suspected) exposure to covid-19: Secondary | ICD-10-CM

## 2019-05-27 DIAGNOSIS — R1110 Vomiting, unspecified: Secondary | ICD-10-CM | POA: Diagnosis present

## 2019-05-27 DIAGNOSIS — K529 Noninfective gastroenteritis and colitis, unspecified: Secondary | ICD-10-CM | POA: Diagnosis not present

## 2019-05-27 LAB — URINALYSIS, COMPLETE (UACMP) WITH MICROSCOPIC
Bilirubin Urine: NEGATIVE
Glucose, UA: NEGATIVE mg/dL
Hgb urine dipstick: NEGATIVE
Ketones, ur: NEGATIVE mg/dL
Nitrite: NEGATIVE
Protein, ur: NEGATIVE mg/dL
Specific Gravity, Urine: 1.019 (ref 1.005–1.030)
pH: 5 (ref 5.0–8.0)

## 2019-05-27 LAB — CBC WITH DIFFERENTIAL/PLATELET
Abs Immature Granulocytes: 0.01 10*3/uL (ref 0.00–0.07)
Basophils Absolute: 0 10*3/uL (ref 0.0–0.1)
Basophils Relative: 0 %
Eosinophils Absolute: 0.1 10*3/uL (ref 0.0–0.5)
Eosinophils Relative: 1 %
HCT: 38.9 % (ref 36.0–46.0)
Hemoglobin: 13.5 g/dL (ref 12.0–15.0)
Immature Granulocytes: 0 %
Lymphocytes Relative: 39 %
Lymphs Abs: 2.1 10*3/uL (ref 0.7–4.0)
MCH: 32.8 pg (ref 26.0–34.0)
MCHC: 34.7 g/dL (ref 30.0–36.0)
MCV: 94.4 fL (ref 80.0–100.0)
Monocytes Absolute: 0.8 10*3/uL (ref 0.1–1.0)
Monocytes Relative: 15 %
Neutro Abs: 2.4 10*3/uL (ref 1.7–7.7)
Neutrophils Relative %: 45 %
Platelets: 254 10*3/uL (ref 150–400)
RBC: 4.12 MIL/uL (ref 3.87–5.11)
RDW: 11.4 % — ABNORMAL LOW (ref 11.5–15.5)
WBC: 5.4 10*3/uL (ref 4.0–10.5)
nRBC: 0 % (ref 0.0–0.2)

## 2019-05-27 LAB — COMPREHENSIVE METABOLIC PANEL
ALT: 33 U/L (ref 0–44)
AST: 30 U/L (ref 15–41)
Albumin: 3.9 g/dL (ref 3.5–5.0)
Alkaline Phosphatase: 87 U/L (ref 38–126)
Anion gap: 9 (ref 5–15)
BUN: 16 mg/dL (ref 6–20)
CO2: 20 mmol/L — ABNORMAL LOW (ref 22–32)
Calcium: 8.7 mg/dL — ABNORMAL LOW (ref 8.9–10.3)
Chloride: 109 mmol/L (ref 98–111)
Creatinine, Ser: 0.76 mg/dL (ref 0.44–1.00)
GFR calc Af Amer: >60 mL/min (ref >60)
GFR calc non Af Amer: >60 mL/min (ref >60)
Glucose, Bld: 88 mg/dL (ref 70–99)
Potassium: 4 mmol/L (ref 3.5–5.1)
Sodium: 138 mmol/L (ref 135–145)
Total Bilirubin: 0.6 mg/dL (ref 0.3–1.2)
Total Protein: 7.2 g/dL (ref 6.5–8.1)

## 2019-05-27 LAB — POCT PREGNANCY, URINE: Preg Test, Ur: NEGATIVE

## 2019-05-27 MED ORDER — LOPERAMIDE HCL 2 MG PO CAPS: 2.0000 mg | ORAL_CAPSULE | ORAL | 0 refills | Status: DC | PRN

## 2019-05-27 MED ORDER — ONDANSETRON HCL 4 MG/2ML IJ SOLN
4.0000 mg | Freq: Once | INTRAMUSCULAR | Status: AC
  Administered 2019-05-27: 4 mg via INTRAVENOUS
  Filled 2019-05-27: qty 2

## 2019-05-27 MED ORDER — ONDANSETRON 4 MG PO TBDP: 4.0000 mg | ORAL_TABLET | Freq: Three times a day (TID) | ORAL | 0 refills | Status: DC | PRN

## 2019-05-27 MED ORDER — LACTATED RINGERS IV BOLUS
1000.0000 mL | Freq: Once | INTRAVENOUS | Status: AC
  Administered 2019-05-27: 1000 mL via INTRAVENOUS

## 2019-05-27 NOTE — ED Triage Notes (Signed)
Pt states tested negative for Covid on Monday, states has had emesis and diarrhea x 1 week, had e-visit today and MD recommended IV fluids and retest. Pt states her mom tested positive yesterday.

## 2019-05-27 NOTE — ED Provider Notes (Signed)
[facility] Emergency Department Provider Note   ____________________________________________   First MD Initiated Contact with Patient 05/27/19 1200     (approximate)  I have reviewed the triage vital signs and the nursing notes.   HISTORY  Chief Complaint Emesis and Diarrhea    HPI Brenda Osborn is a 23 y.o. female with no significant past medical history who presents to the ED complaining of vomiting and diarrhea.  Patient reports she has had about 1 week of profuse watery diarrhea, approximately 20 episodes per day.  She denies any recent antibiotic use, has not had any recent travel.  She has noticed small streaks of blood when she wipes, but is otherwise not noticed any blood in her stool.  Over the past 24 hours, she has felt increasingly nauseous and vomited 5-6 times overnight.  She denies any fevers, chills, cough, chest pain, or shortness of breath.  She does state that her mother recently tested positive for COVID-19 and she was exposed to her mother 5 days ago.  She did have recent COVID-19 testing that was negative yesterday.        History reviewed. No pertinent past medical history.  Patient Active Problem List   Diagnosis Date Noted  . Hidradenitis suppurativa 10/03/2017  . Elevated BP without diagnosis of hypertension 03/28/2017  . Headache 03/28/2017    Past Surgical History:  Procedure Laterality Date  . NO PAST SURGERIES      Prior to Admission medications   Medication Sig Start Date End Date Taking? Authorizing Provider  levonorgestrel (MIRENA) 20 MCG/24HR IUD 1 each by Intrauterine route once.    [provider]  loperamide (IMODIUM) 2 MG capsule Take 1 capsule (2 mg total) by mouth as needed for diarrhea or loose stools. 05/27/19   Blake Divine, MD  ondansetron (ZOFRAN ODT) 4 MG disintegrating tablet Take 1 tablet (4 mg total) by mouth every 8 (eight) hours as needed for nausea or vomiting. 05/27/19   Blake Divine, MD    Allergies Zithromax [azithromycin]  Family History  Problem Relation Age of Onset  . Diabetes Mother        "Pre Diabetes"  . Healthy Sister   . Hypertension Maternal Grandmother   . Healthy Sister   . Breast cancer Neg Hx   . Ovarian cancer Neg Hx   . Colon cancer Neg Hx     Social History Social History   Tobacco Use  . Smoking status: Never Smoker  . Smokeless tobacco: Never Used  Substance Use Topics  . Alcohol use: No  . Drug use: No    Review of Systems  Constitutional: No fever/chills Eyes: No visual changes. ENT: No sore throat. Cardiovascular: Denies chest pain. Respiratory: Denies shortness of breath. Gastrointestinal: No abdominal pain.  Positive for nausea, vomiting, and diarrhea.  No constipation. Genitourinary: Negative for dysuria. Musculoskeletal: Negative for back pain. Skin: Negative for rash. Neurological: Negative for headaches, focal weakness or numbness.  ____________________________________________   PHYSICAL EXAM:  VITAL SIGNS: ED Triage Vitals  Enc Vitals Group     BP 05/27/19 1148 (!) 142/91     Pulse Rate 05/27/19 1148 97     Resp 05/27/19 1148 16     Temp 05/27/19 1148 99 F (37.2 C)     Temp Source 05/27/19 1148 Oral     SpO2 05/27/19 1148 95 %     Weight --      Height --      Head Circumference --  Peak Flow --      Pain Score 05/27/19 1145 0     Pain Loc --      Pain Edu? --      Excl. in [place]? --     Constitutional: Alert and oriented. Eyes: Conjunctivae are normal. Head: Atraumatic. Nose: No congestion/rhinnorhea. Mouth/Throat: Mucous membranes are moist. Neck: Normal ROM Cardiovascular: Normal rate, regular rhythm. Grossly normal heart sounds. Respiratory: Normal respiratory effort.  No retractions. Lungs CTAB. Gastrointestinal: Soft and nontender. No distention. Genitourinary: deferred Musculoskeletal: No lower extremity tenderness nor edema. Neurologic:  Normal speech and language. No  gross focal neurologic deficits are appreciated. Skin:  Skin is warm, dry and intact. No rash noted. Psychiatric: Mood and affect are normal. Speech and behavior are normal.  ____________________________________________   LABS (all labs ordered are listed, but only abnormal results are displayed)  Labs Reviewed  URINALYSIS, COMPLETE (UACMP) WITH MICROSCOPIC - Abnormal; Notable for the following components:      Result Value   Color, Urine YELLOW (*)    APPearance CLEAR (*)    Leukocytes,Ua TRACE (*)    Bacteria, UA RARE (*)    All other components within normal limits  COMPREHENSIVE METABOLIC PANEL - Abnormal; Notable for the following components:   CO2 20 (*)    Calcium 8.7 (*)    All other components within normal limits  CBC WITH DIFFERENTIAL/PLATELET - Abnormal; Notable for the following components:   RDW 11.4 (*)    All other components within normal limits  SARS CORONAVIRUS 2 (TAT 6-12 HRS)  POC URINE PREG, ED  POCT PREGNANCY, URINE     PROCEDURES  Procedure(s) performed (including Critical Care):  Procedures   ____________________________________________   INITIAL IMPRESSION / ASSESSMENT AND PLAN / ED COURSE       Previously healthy 23 year old female presents to the ED with approximately 1 week of watery diarrhea, now associated with nausea and vomiting.  Given patient's close contact with confirmed COVID-19 case, will repeat testing today.  She overall appears well with benign abdominal exam, if COVID testing negative then suspect viral gastroenteritis.  Will screen labs and treat symptomatically, hydrate with IV fluids.  Patient reports symptoms improved following Zofran and IV fluids.  Lab work unremarkable, including LFTs and lipase.  Repeat COVID testing pending at this time, counseled patient to resume positive until told otherwise.  Counseled to return to the ED for new or worsening symptoms, patient agrees with plan.       ____________________________________________   FINAL CLINICAL IMPRESSION(S) / ED DIAGNOSES  Final diagnoses:  Gastroenteritis  Close Exposure to Covid-19 Virus     ED Discharge Orders         Ordered    ondansetron (ZOFRAN ODT) 4 MG disintegrating tablet  Every 8 hours PRN     05/27/19 1439    loperamide (IMODIUM) 2 MG capsule  As needed     05/27/19 1439           Note:  This document was prepared using Dragon voice recognition software and may include unintentional dictation errors.   Blake Divine, MD 05/27/19 2021

## 2019-05-27 NOTE — ED Notes (Signed)
UA sterile cup at bedside. Pt made ware of UA needed at this time. Pt unable to void at this time.

## 2019-05-28 LAB — SARS CORONAVIRUS 2 (TAT 6-24 HRS): SARS Coronavirus 2: NEGATIVE

## 2019-09-19 ENCOUNTER — Encounter: Payer: Self-pay | Admitting: Emergency Medicine

## 2019-09-19 ENCOUNTER — Emergency Department: Payer: Managed Care, Other (non HMO)

## 2019-09-19 ENCOUNTER — Emergency Department
Admission: EM | Admit: 2019-09-19 | Discharge: 2019-09-19 | Disposition: A | Discharge status: Home / Self Care | Payer: Managed Care, Other (non HMO) | Attending: Emergency Medicine | Admitting: Emergency Medicine

## 2019-09-19 ENCOUNTER — Other Ambulatory Visit: Payer: Self-pay

## 2019-09-19 DIAGNOSIS — R079 Chest pain, unspecified: Secondary | ICD-10-CM

## 2019-09-19 DIAGNOSIS — I10 Essential (primary) hypertension: Secondary | ICD-10-CM | POA: Diagnosis not present

## 2019-09-19 DIAGNOSIS — R0789 Other chest pain: Secondary | ICD-10-CM | POA: Diagnosis not present

## 2019-09-19 DIAGNOSIS — R000 Tachycardia, unspecified: Secondary | ICD-10-CM | POA: Diagnosis not present

## 2019-09-19 HISTORY — DX: Essential (primary) hypertension: I10

## 2019-09-19 LAB — BASIC METABOLIC PANEL
Anion gap: 8 (ref 5–15)
BUN: 16 mg/dL (ref 6–20)
CO2: 24 mmol/L (ref 22–32)
Calcium: 9.2 mg/dL (ref 8.9–10.3)
Chloride: 106 mmol/L (ref 98–111)
Creatinine, Ser: 0.79 mg/dL (ref 0.44–1.00)
GFR calc Af Amer: >60 mL/min (ref >60)
GFR calc non Af Amer: >60 mL/min (ref >60)
Glucose, Bld: 116 mg/dL — ABNORMAL HIGH (ref 70–99)
Potassium: 3.5 mmol/L (ref 3.5–5.1)
Sodium: 138 mmol/L (ref 135–145)

## 2019-09-19 LAB — CBC
HCT: 40.7 % (ref 36.0–46.0)
Hemoglobin: 14.2 g/dL (ref 12.0–15.0)
MCH: 32.6 pg (ref 26.0–34.0)
MCHC: 34.9 g/dL (ref 30.0–36.0)
MCV: 93.6 fL (ref 80.0–100.0)
Platelets: 318 10*3/uL (ref 150–400)
RBC: 4.35 MIL/uL (ref 3.87–5.11)
RDW: 11.2 % — ABNORMAL LOW (ref 11.5–15.5)
WBC: 13.9 10*3/uL — ABNORMAL HIGH (ref 4.0–10.5)
nRBC: 0 % (ref 0.0–0.2)

## 2019-09-19 LAB — T4, FREE: Free T4: 0.81 ng/dL (ref 0.61–1.12)

## 2019-09-19 LAB — TSH: TSH: 2.025 u[IU]/mL (ref 0.350–4.500)

## 2019-09-19 LAB — FIBRIN DERIVATIVES D-DIMER (ARMC ONLY): Fibrin derivatives D-dimer (ARMC): 193.83 ng/mL (FEU) (ref 0.00–499.00)

## 2019-09-19 LAB — TROPONIN I (HIGH SENSITIVITY): Troponin I (High Sensitivity): 3 ng/L (ref <18)

## 2019-09-19 MED ORDER — SODIUM CHLORIDE 0.9 % IV BOLUS
1000.0000 mL | Freq: Once | INTRAVENOUS | Status: AC
  Administered 2019-09-19: 16:00:00 1000 mL via INTRAVENOUS

## 2019-09-19 MED ORDER — KETOROLAC TROMETHAMINE 30 MG/ML IJ SOLN: 15.0000 mg | INTRAMUSCULAR | Status: DC

## 2019-09-19 MED ORDER — LORAZEPAM 2 MG/ML IJ SOLN: 1.0000 mg | Freq: Once | INTRAMUSCULAR | Status: DC

## 2019-09-19 NOTE — ED Notes (Signed)
Informed pt we need a urine sample. Pt unable to go at this time and told to call whenever she could go. Pt verbalized understanding.

## 2019-09-19 NOTE — ED Notes (Signed)
Dr. Joni Fears at bedside. Pt decided to leave AMA understanding the risk. This RN went in to d/c pt. I asked pt if she had any concerns about today's visit, and if I could do anything for her. She asked for her lab results and I told her that if she had "MyChart" should could see her results there. Pt states she had MyChart and verbalized understanding. Pt had no other concerns at this time. Pt signed the AMA form.

## 2019-09-19 NOTE — ED Triage Notes (Signed)
Pt to ER via EMS from work with c/o midsternal chest pain described as sharp that radiates into left shoulder.  Pt reports SHOB earlier when pain was at its worst, but pain and is better and [facility] has subsided.

## 2019-09-19 NOTE — ED Provider Notes (Signed)
[facility] Emergency Department Provider Note  ____________________________________________  Time seen: Approximately 4:48 PM  I have reviewed the triage vital signs and the nursing notes.   HISTORY  Chief Complaint Chest Pain    HPI Brenda Osborn is a 23 y.o. female with a history of hypertension and hidradenitis suppurativa who comes the ED complaining of midsternal chest pain described as sharp and radiating to left shoulder.  Started earlier today, gradual onset.  Associated with some shortness of breath that is since subsided.  Not exertional, not pleuritic.  No dizziness palpitations vomiting or diaphoresis.  Currently 4/10 pain severity.  No history of DVT or PE.  No recent travel trauma hospitalization or surgery.     Past Medical History:  Diagnosis Date  . Hypertension      Patient Active Problem List   Diagnosis Date Noted  . Hidradenitis suppurativa 10/03/2017  . Elevated BP without diagnosis of hypertension 03/28/2017  . Headache 03/28/2017     Past Surgical History:  Procedure Laterality Date  . NO PAST SURGERIES       Prior to Admission medications   Medication Sig Start Date End Date Taking? Authorizing Provider  levonorgestrel (MIRENA) 20 MCG/24HR IUD 1 each by Intrauterine route once.    [provider]  loperamide (IMODIUM) 2 MG capsule Take 1 capsule (2 mg total) by mouth as needed for diarrhea or loose stools. 05/27/19   Blake Divine, MD  ondansetron (ZOFRAN ODT) 4 MG disintegrating tablet Take 1 tablet (4 mg total) by mouth every 8 (eight) hours as needed for nausea or vomiting. 05/27/19   Blake Divine, MD     Allergies Zithromax [azithromycin]   Family History  Problem Relation Age of Onset  . Diabetes Mother        "Pre Diabetes"  . Healthy Sister   . Hypertension Maternal Grandmother   . Healthy Sister   . Breast cancer Neg Hx   . Ovarian cancer Neg Hx   . Colon cancer Neg Hx      Social History Social History   Tobacco Use  . Smoking status: Never Smoker  . Smokeless tobacco: Never Used  Substance Use Topics  . Alcohol use: No  . Drug use: No    Review of Systems  Constitutional:   No fever or chills.  ENT:   No sore throat. No rhinorrhea. Cardiovascular:   Positive chest pain as above syncope. Respiratory:   No dyspnea or cough. Gastrointestinal:   Negative for abdominal pain, vomiting and diarrhea.  Musculoskeletal:   Negative for focal pain or swelling All other systems reviewed and are negative except as documented above in ROS and HPI.  ____________________________________________   PHYSICAL EXAM:  VITAL SIGNS: ED Triage Vitals  Enc Vitals Group     BP 09/19/19 1549 (!) 135/96     Pulse Rate 09/19/19 1549 (!) 111     Resp 09/19/19 1549 20     Temp 09/19/19 1549 98.6 F (37 C)     Temp src --      SpO2 09/19/19 1549 100 %     Weight 09/19/19 1546 220 lb (99.8 kg)     Height 09/19/19 1546 5\' 8"  (1.727 m)     Head Circumference --      Peak Flow --      Pain Score 09/19/19 1545 4     Pain Loc --      Pain Edu? --      Excl. in  GC? --     Vital signs reviewed, nursing assessments reviewed.   Constitutional:   Alert and oriented. Non-toxic appearance. Eyes:   Conjunctivae are normal. EOMI. PERRL. ENT      Head:   Normocephalic and atraumatic.      Nose:   Wearing a mask.      Mouth/Throat:   Wearing a mask.      Neck:   No meningismus. Full ROM.  Fullness of the thyroid, nontender Hematological/Lymphatic/Immunilogical:   No cervical lymphadenopathy. Cardiovascular:   Tachycardia heart rate 105. Symmetric bilateral radial and DP pulses.  No murmurs. Cap refill less than 2 seconds. Respiratory:   Normal respiratory effort without tachypnea/retractions. Breath sounds are clear and equal bilaterally. No wheezes/rales/rhonchi. Gastrointestinal:   Soft and nontender. Non distended. There is no CVA tenderness.  No rebound, rigidity,  or guarding. Musculoskeletal:   Normal range of motion in all extremities. No joint effusions.  No lower extremity tenderness.  No edema. Neurologic:   Normal speech and language.  Motor grossly intact. No acute focal neurologic deficits are appreciated.  Skin:    Skin is warm, dry and intact. No rash noted.  No petechiae, purpura, or bullae.  ____________________________________________    LABS (pertinent positives/negatives) (all labs ordered are listed, but only abnormal results are displayed) Labs Reviewed  BASIC METABOLIC PANEL - Abnormal; Notable for the following components:      Result Value   Glucose, Bld 116 (*)    All other components within normal limits  CBC - Abnormal; Notable for the following components:   WBC 13.9 (*)    RDW 11.2 (*)    All other components within normal limits  FIBRIN DERIVATIVES D-DIMER ([facility] ONLY)  T4, FREE  TSH  URINALYSIS, COMPLETE (UACMP) WITH MICROSCOPIC  POC URINE PREG, ED  TROPONIN I (HIGH SENSITIVITY)  TROPONIN I (HIGH SENSITIVITY)   ____________________________________________   EKG  Interpreted by me Sinus tachycardia rate 110.  Normal axis and intervals.  Normal QRS ST segments and T waves.  No evidence of right heart strain.  ____________________________________________    RADIOLOGY  DG Chest Portable 1 View  Result Date: 09/19/2019 CLINICAL DATA:  Acute onset of chest pain, shortness of breath, and tachycardia. EXAM: PORTABLE CHEST 1 VIEW COMPARISON:  None. FINDINGS: The heart size and mediastinal contours are within normal limits. Both lungs are clear. The visualized skeletal structures are unremarkable. IMPRESSION: No active disease. Electronically Signed   By: Marlaine Hind M.D.   On: 09/19/2019 16:57    ____________________________________________   PROCEDURES Procedures  ____________________________________________  DIFFERENTIAL DIAGNOSIS   Pneumonia, costochondritis, pulmonary embolism, pneumothorax,  hyperthyroidism  CLINICAL IMPRESSION / ASSESSMENT AND PLAN / ED COURSE  Medications ordered in the ED: Medications  LORazepam (ATIVAN) injection 1 mg (has no administration in time range)  ketorolac (TORADOL) 30 MG/ML injection 15 mg (has no administration in time range)  sodium chloride 0.9 % bolus 1,000 mL (0 mLs Intravenous Stopped 09/19/19 1742)    Pertinent labs & imaging results that were available during my care of the patient were reviewed by me and considered in my medical decision making (see chart for details).  Brenda Osborn was evaluated in Emergency Department on 09/19/2019 for the symptoms described in the history of present illness. She was evaluated in the context of the global COVID-19 pandemic, which necessitated consideration that the patient might be at risk for infection with the SARS-CoV-2 virus that causes COVID-19. Institutional protocols and algorithms that pertain to  the evaluation of patients at risk for COVID-19 are in a state of rapid change based on information released by regulatory bodies including the CDC and federal and state organizations. These policies and algorithms were followed during the patient's care in the ED.   Patient presents with atypical chest pain.  Doubt ACS dissection pericardial effusion or myocarditis.  Due to tachycardia and Mirena use, will need D-dimer to evaluate for possible PE.  Also suspicion for hyperthyroidism and I will check TSH and chest x-ray.  Clinical Course as of Sep 18 1825  Sun Sep 19, 2019  1719 Work-up entirely reassuring including normal chest x-ray, chemistry panel, troponin, D-dimer, thyroid studies.   [PS]  [id number] Patient describes still having 5/10 central chest pain, not reproducible with palpation and the chest wall.  Still mildly tachycardic and tachypneic.  Given the absence of any explanation for her symptoms, hormonal IUD, she will need CT scan to definitively evaluate for PE.  I will give IV Toradol 50 mg  for pain control.  Ativan 1 mg IV due to patient reported anxiety about CT scan..   [PS]    Clinical Course User Index [PS] Carrie Mew, MD    ----------------------------------------- 6:25 PM on 09/19/2019 -----------------------------------------  Patient now reports that she does not want to have the CT scan done.  Tachycardia has not significantly improved despite IV fluids.  Discussed with the patient the reasons why I still suspect pulmonary embolism as a cause for her chest pain despite the low D-dimer.  She understands, and has medical decision-making capacity.  Advised her to return to this emergency department if she changes her mind or has any worsening symptoms, to which she agrees.   ____________________________________________   FINAL CLINICAL IMPRESSION(S) / ED DIAGNOSES    Final diagnoses:  Nonspecific chest pain  Sinus tachycardia     ED Discharge Orders    None      Portions of this note were generated with dragon dictation software. Dictation errors may occur despite best attempts at proofreading.   Carrie Mew, MD 09/19/19 [phone]

## 2019-09-19 NOTE — ED Notes (Signed)
Called nurse into to room. Pt states, "I want to go home. I just want to rest and if I feel worse I will just come back." Dr. Joni Fears, MD made aware

## 2019-09-22 ENCOUNTER — Other Ambulatory Visit: Payer: Self-pay | Admitting: Internal Medicine

## 2019-09-22 DIAGNOSIS — R079 Chest pain, unspecified: Secondary | ICD-10-CM

## 2019-09-22 DIAGNOSIS — R110 Nausea: Secondary | ICD-10-CM

## 2019-10-04 ENCOUNTER — Ambulatory Visit
Admission: RE | Admit: 2019-10-04 | Discharge: 2019-10-04 | Disposition: A | Discharge status: Home / Self Care | Payer: Managed Care, Other (non HMO) | Source: Ambulatory Visit | Attending: Internal Medicine | Admitting: Internal Medicine

## 2019-10-04 ENCOUNTER — Other Ambulatory Visit: Payer: Self-pay

## 2019-10-04 DIAGNOSIS — R079 Chest pain, unspecified: Secondary | ICD-10-CM | POA: Insufficient documentation

## 2019-10-04 DIAGNOSIS — R110 Nausea: Secondary | ICD-10-CM | POA: Diagnosis present

## 2019-11-01 ENCOUNTER — Encounter: Payer: Self-pay | Admitting: Certified Nurse Midwife

## 2019-11-01 ENCOUNTER — Ambulatory Visit (INDEPENDENT_AMBULATORY_CARE_PROVIDER_SITE_OTHER): Payer: Managed Care, Other (non HMO) | Admitting: Certified Nurse Midwife

## 2019-11-01 ENCOUNTER — Other Ambulatory Visit: Payer: Self-pay

## 2019-11-01 VITALS — BP 106/81 | HR 87 | Ht 68.0 in | Wt 246.3 lb

## 2019-11-01 DIAGNOSIS — Z23 Encounter for immunization: Secondary | ICD-10-CM

## 2019-11-01 DIAGNOSIS — Z30432 Encounter for removal of intrauterine contraceptive device: Secondary | ICD-10-CM

## 2019-11-01 NOTE — Progress Notes (Signed)
    GYNECOLOGY OFFICE PROCEDURE NOTE  Brenda Osborn is a 24 y.o. G0P0000 here for Mirena IUD removal. No GYN concerns.  Last pap smear was on 03/13/18 and was normal.  IUD Removal  Patient identified, informed consent performed, consent signed.  Patient was in the dorsal lithotomy position, normal external genitalia was noted.  A speculum was placed in the patient's vagina, normal discharge was noted, no lesions. The cervix was visualized, no lesions, no abnormal discharge.  The strings of the IUD were grasped and pulled using ring forceps. The IUD was removed in its entirety.  Patient tolerated the procedure well.    Patient plans for pregnancy soon and she was told to avoid teratogens, take PNV and folic acid.  Routine preventative health maintenance measures emphasized.  Philip Aspen, CNM

## 2019-11-01 NOTE — Patient Instructions (Signed)
Preparing for Pregnancy If you are considering becoming pregnant, make an appointment to see your regular health care provider to learn how to prepare for a safe and healthy pregnancy (preconception care). During a preconception care visit, your health care provider will:  Do a complete physical exam, including a Pap test.  Take a complete medical history.  Give you information, answer your questions, and help you resolve problems. Preconception checklist Medical history  Tell your health care provider about any current or past medical conditions. Your pregnancy or your ability to become pregnant may be affected by chronic conditions, such as diabetes, chronic hypertension, and thyroid problems.  Include your family's medical history as well as your partner's medical history.  Tell your health care provider about any history of STIs (sexually transmitted infections).These can affect your pregnancy. In some cases, they can be passed to your baby. Discuss any concerns that you have about STIs.  If indicated, discuss the benefits of genetic testing. This testing will show whether there are any genetic conditions that may be passed from you or your partner to your baby.  Tell your health care provider about: ? Any problems you have had with conception or pregnancy. ? Any medicines you take. These include vitamins, herbal supplements, and over-the-counter medicines. ? Your history of immunizations. Discuss any vaccinations that you may need. Diet  Ask your health care provider what to include in a healthy diet that has a balance of nutrients. This is especially important when you are pregnant or preparing to become pregnant.  Ask your health care provider to help you reach a healthy weight before pregnancy. ? If you are overweight, you may be at higher risk for certain complications, such as high blood pressure, diabetes, and preterm birth. ? If you are underweight, you are more likely to  have a baby who has a low birth weight. Lifestyle, work, and home  Let your health care provider know: ? About any lifestyle habits that you have, such as alcohol use, drug use, or smoking. ? About recreational activities that may put you at risk during pregnancy, such as downhill skiing and certain exercise programs. ? Tell your health care provider about any international travel, especially any travel to places with an active Zika virus outbreak. ? About harmful substances that you may be exposed to at work or at home. These include chemicals, pesticides, radiation, or even litter boxes. ? If you do not feel safe at home. Mental health  Tell your health care provider about: ? Any history of mental health conditions, including feelings of depression, sadness, or anxiety. ? Any medicines that you take for a mental health condition. These include herbs and supplements. Home instructions to prepare for pregnancy Lifestyle   Eat a balanced diet. This includes fresh fruits and vegetables, whole grains, lean meats, low-fat dairy products, healthy fats, and foods that are high in fiber. Ask to meet with a nutritionist or registered dietitian for assistance with meal planning and goals.  Get regular exercise. Try to be active for at least 30 minutes a day on most days of the week. Ask your health care provider which activities are safe during pregnancy.  Do not use any products that contain nicotine or tobacco, such as cigarettes and e-cigarettes. If you need help quitting, ask your health care provider.  Do not drink alcohol.  Do not take illegal drugs.  Maintain a healthy weight. Ask your health care provider what weight range is right for you. General   instructions °· Keep an accurate record of your menstrual periods. This makes it easier for your health care provider to determine your baby's due date. °· Begin taking prenatal vitamins and folic acid supplements daily as directed by your  health care provider. °· Manage any chronic conditions, such as high blood pressure and diabetes, as told by your health care provider. This is important. °How do I know that I am pregnant? °You may be pregnant if you have been sexually active and you miss your period. Symptoms of early pregnancy include: °· Mild cramping. °· Very light vaginal bleeding (spotting). °· Feeling unusually tired. °· Nausea and vomiting (morning sickness). °If you have any of these symptoms and you suspect that you might be pregnant, you can take a home pregnancy test. These tests check for a hormone in your urine (human chorionic gonadotropin, or hCG). A woman's body begins to make this hormone during early pregnancy. These tests are very accurate. Wait until at least the first day after you miss your period to take one. If the test shows that you are pregnant (you get a positive result), call your health care provider to make an appointment for prenatal care. °What should I do if I become pregnant? ° °  ° °· Make an appointment with your health care provider as soon as you suspect you are pregnant. °· Do not use any products that contain nicotine, such as cigarettes, chewing tobacco, and e-cigarettes. If you need help quitting, ask your health care provider. °· Do not drink alcoholic beverages. Alcohol is related to a number of birth defects. °· Avoid toxic odors and chemicals. °· You may continue to have sexual intercourse if it does not cause pain or other problems, such as vaginal bleeding. °This information is not intended to replace advice given to you by your health care provider. Make sure you discuss any questions you have with your health care provider. °Document Revised: 09/18/2017 Document Reviewed: 04/07/2016 °Elsevier Patient Education © 2020 [employer] ° °

## 2019-11-04 DIAGNOSIS — F411 Generalized anxiety disorder: Secondary | ICD-10-CM | POA: Insufficient documentation

## 2020-01-07 ENCOUNTER — Other Ambulatory Visit: Payer: Self-pay

## 2020-01-07 ENCOUNTER — Ambulatory Visit: Payer: Managed Care, Other (non HMO) | Admitting: Certified Nurse Midwife

## 2020-01-07 ENCOUNTER — Other Ambulatory Visit: Payer: Managed Care, Other (non HMO)

## 2020-01-07 ENCOUNTER — Encounter: Payer: Self-pay | Admitting: Certified Nurse Midwife

## 2020-01-07 VITALS — BP 108/78 | HR 76 | Ht 68.0 in | Wt 244.2 lb

## 2020-01-07 DIAGNOSIS — O200 Threatened abortion: Secondary | ICD-10-CM | POA: Diagnosis not present

## 2020-01-07 MED ORDER — ASPIRIN EC 81 MG PO TBEC: 81.0000 mg | DELAYED_RELEASE_TABLET | Freq: Every day | ORAL | 2 refills | Status: DC

## 2020-01-07 NOTE — Progress Notes (Signed)
GYN ENCOUNTER NOTE  Subjective:       Brenda Osborn is a 24 y.o. G0P0000 female is here for gynecologic evaluation of the following issues:  1. Threatened miscarriage  Reports positive pregnancy test on Monday with nausea, vomiting, and food aversions.   Started bleeding on Monday night with noted decrease in pregnancy symptoms.   Denies difficulty breathing or respiratory distress, chest pain, abdominal pain, excessive vaginal bleeding, dysuria, and leg pain or swelling.    Gynecologic History  Patient's last menstrual period was 01/03/2020 (exact date).  Contraception: none  Last Pap: 02/2018. Results were: normal  Obstetric History  OB History  Gravida Para Term Preterm AB Living  0 0 0 0 0 0  SAB TAB Ectopic Multiple Live Births  0 0 0 0 0    Past Medical History:  Diagnosis Date  . Hypertension     Past Surgical History:  Procedure Laterality Date  . NO PAST SURGERIES      Current Outpatient Medications on File Prior to Visit  Medication Sig Dispense Refill  . metoprolol succinate (TOPROL-XL) 50 MG 24 hr tablet Take by mouth.    . levonorgestrel (MIRENA) 20 MCG/24HR IUD 1 each by Intrauterine route once.    . loperamide (IMODIUM) 2 MG capsule Take 1 capsule (2 mg total) by mouth as needed for diarrhea or loose stools. (Patient not taking: Reported on 11/01/2019) 30 capsule 0  . omeprazole (PRILOSEC) 40 MG capsule Take by mouth.    . ondansetron (ZOFRAN ODT) 4 MG disintegrating tablet Take 1 tablet (4 mg total) by mouth every 8 (eight) hours as needed for nausea or vomiting. (Patient not taking: Reported on 11/01/2019) 12 tablet 0   No current facility-administered medications on file prior to visit.    Allergies  Allergen Reactions  . Zithromax [Azithromycin]     Respiratory Destress    Social History   Socioeconomic History  . Marital status: Married    Spouse name: Not on file  . Number of children: Not on file  . Years of education: Not on file   . Highest education level: Not on file  Occupational History  . Not on file  Tobacco Use  . Smoking status: Never Smoker  . Smokeless tobacco: Never Used  Substance and Sexual Activity  . Alcohol use: No  . Drug use: No  . Sexual activity: Yes    Partners: Male    Birth control/protection: I.U.D.    Comment: Mirena 05/02/2017  Other Topics Concern  . Not on file  Social History Narrative  . Not on file   Social Determinants of Health   Financial Resource Strain:   . Difficulty of Paying Living Expenses:   Food Insecurity:   . Worried About Charity fundraiser in the Last Year:   . Arboriculturist in the Last Year:   Transportation Needs:   . Film/video editor (Medical):   Marland Kitchen Lack of Transportation (Non-Medical):   Physical Activity:   . Days of Exercise per Week:   . Minutes of Exercise per Session:   Stress:   . Feeling of Stress :   Social Connections:   . Frequency of Communication with Friends and Family:   . Frequency of Social Gatherings with Friends and Family:   . Attends Religious Services:   . Active Member of Clubs or Organizations:   . Attends Archivist Meetings:   Marland Kitchen Marital Status:   Intimate Partner Violence:   .  Fear of Current or Ex-Partner:   . Emotionally Abused:   Marland Kitchen Physically Abused:   . Sexually Abused:     Family History  Problem Relation Age of Onset  . Diabetes Mother        "Pre Diabetes"  . Healthy Sister   . Hypertension Maternal Grandmother   . Healthy Sister   . Breast cancer Neg Hx   . Ovarian cancer Neg Hx   . Colon cancer Neg Hx     The following portions of the patient's history were reviewed and updated as appropriate: allergies, current medications, past family history, past medical history, past social history, past surgical history and problem list.  Review of Systems  ROS negative except as noted above. Information obtained from patient.   Objective:   BP 108/78   Pulse 76   Ht 5\' 8"  (1.727 m)    Wt 244 lb 4 oz (110.8 kg)   LMP 01/03/2020 (Exact Date)   BMI 37.14 kg/m    CONSTITUTIONAL: Well-developed, well-nourished female in no acute distress.   PHYSICAL EXAM: Not indicated.    Assessment:   1. Threatened miscarriage  - Beta hCG quant (ref lab)     Plan:   Labs today, see orders  Rx Aspirin, see orders  Reviewed red flag symptoms and when to call  RTC Monday for repeat labs   Diona Fanti, CNM [facility] 01/07/20 12:18 PM

## 2020-01-07 NOTE — Patient Instructions (Signed)
Vaginal Bleeding During Pregnancy, First Trimester  A small amount of bleeding (spotting) from the vagina is common during early pregnancy. Sometimes the bleeding is normal and does not cause problems. At other times, though, bleeding may be a sign of something serious. Tell your doctor about any bleeding from your vagina right away. Follow these instructions at home: Activity  Follow your doctor's instructions about how active you can be.  If needed, make plans for someone to help with your normal activities.  Do not have sex or orgasms until your doctor says that this is safe. General instructions  Take over-the-counter and prescription medicines only as told by your doctor.  Watch your condition for any changes.  Write down: ? The number of pads you use each day. ? How often you change pads. ? How soaked (saturated) your pads are.  Do not use tampons.  Do not douche.  If you pass any tissue from your vagina, save it to show to your doctor.  Keep all follow-up visits as told by your doctor. This is important. Contact a doctor if:  You have vaginal bleeding at any time while you are pregnant.  You have cramps.  You have a fever. Get help right away if:  You have very bad cramps in your back or belly (abdomen).  You pass large clots or a lot of tissue from your vagina.  Your bleeding gets worse.  You feel light-headed.  You feel weak.  You pass out (faint).  You have chills.  You are leaking fluid from your vagina.  You have a gush of fluid from your vagina. Summary  Sometimes vaginal bleeding during pregnancy is normal and does not cause problems. At other times, bleeding may be a sign of something serious.  Tell your doctor about any bleeding from your vagina right away.  Follow your doctor's instructions about how active you can be. You may need someone to help you with your normal activities. This information is not intended to replace advice given to  you by your health care provider. Make sure you discuss any questions you have with your health care provider. Document Revised: 01/05/2019 Document Reviewed: 12/18/2016 Elsevier Patient Education  [place] A miscarriage is the loss of an unborn baby (fetus) before the 20th week of pregnancy. Follow these instructions at home: Medicines   Take over-the-counter and prescription medicines only as told by your doctor.  If you were prescribed antibiotic medicine, take it as told by your doctor. Do not stop taking the antibiotic even if you start to feel better.  Do not take NSAIDs unless your doctor says that this is safe for you. NSAIDs include aspirin and ibuprofen. These medicines can cause bleeding. Activity  Rest as directed. Ask your doctor what activities are safe for you.  Have someone help you at home during this time. General instructions  Write down how many pads you use each day and how soaked they are.  Watch the amount of tissue or clumps of blood (blood clots) that you pass from your vagina. Save any large amounts of tissue for your doctor.  Do not use tampons, douche, or have sex until your doctor approves.  To help you and your partner with the process of grieving, talk with your doctor or seek counseling.  When you are ready, meet with your doctor to talk about steps you should take for your health. Also, talk with your doctor about steps to take to have  a healthy pregnancy in the future.  Keep all follow-up visits as told by your doctor. This is important. Contact a doctor if:  You have a fever or chills.  You have vaginal discharge that smells bad.  You have more bleeding. Get help right away if:  You have very bad cramps or pain in your back or belly.  You pass clumps of blood that are walnut-sized or larger from your vagina.  You pass tissue that is walnut-sized or larger from your vagina.  You soak more than 1 regular pad in  an hour.  You get light-headed or weak.  You faint (pass out).  You have feelings of sadness that do not go away, or you have thoughts of hurting yourself. Summary  A miscarriage is the loss of an unborn baby before the 20th week of pregnancy.  Follow your doctor's instructions for home care. Keep all follow-up appointments.  To help you and your partner with the process of grieving, talk with your doctor or seek counseling. This information is not intended to replace advice given to you by your health care provider. Make sure you discuss any questions you have with your health care provider. Document Revised: 01/08/2019 Document Reviewed: 10/22/2016 Elsevier Patient Education  [place].

## 2020-01-08 LAB — BETA HCG QUANT (REF LAB): hCG Quant: <1 m[IU]/mL

## 2020-01-10 ENCOUNTER — Other Ambulatory Visit: Payer: Managed Care, Other (non HMO)

## 2020-04-07 ENCOUNTER — Other Ambulatory Visit: Payer: Self-pay

## 2020-04-07 ENCOUNTER — Ambulatory Visit (INDEPENDENT_AMBULATORY_CARE_PROVIDER_SITE_OTHER): Payer: Managed Care, Other (non HMO) | Admitting: Certified Nurse Midwife

## 2020-04-07 ENCOUNTER — Encounter: Payer: Self-pay | Admitting: Certified Nurse Midwife

## 2020-04-07 VITALS — BP 112/75 | HR 87 | Ht 68.0 in | Wt 245.5 lb

## 2020-04-07 DIAGNOSIS — Z8759 Personal history of other complications of pregnancy, childbirth and the puerperium: Secondary | ICD-10-CM | POA: Diagnosis not present

## 2020-04-07 DIAGNOSIS — R109 Unspecified abdominal pain: Secondary | ICD-10-CM

## 2020-04-07 DIAGNOSIS — O26899 Other specified pregnancy related conditions, unspecified trimester: Secondary | ICD-10-CM | POA: Diagnosis not present

## 2020-04-07 DIAGNOSIS — O219 Vomiting of pregnancy, unspecified: Secondary | ICD-10-CM | POA: Diagnosis not present

## 2020-04-07 DIAGNOSIS — I10 Essential (primary) hypertension: Secondary | ICD-10-CM | POA: Diagnosis not present

## 2020-04-07 DIAGNOSIS — N926 Irregular menstruation, unspecified: Secondary | ICD-10-CM

## 2020-04-07 DIAGNOSIS — Z09 Encounter for follow-up examination after completed treatment for conditions other than malignant neoplasm: Secondary | ICD-10-CM

## 2020-04-07 NOTE — Progress Notes (Signed)
GYN ENCOUNTER NOTE  Subjective:       Brenda Osborn is a 24 y.o. G25P0000 female here for ER follow.   Reports positive pregnancy testing at ER during evaluation for chest pain.   History significant for work induced anxiety and hypertension.   Currently taking metoprolol, aspirin and a prenatal vitamin.   Endorses breast tenderness, nausea with vomiting and food aversions, and intermittent abdominal cramping.   Denies difficulty breathing or respiratory distress, chest pain, dysuria, and leg pain or swelling.    Gynecologic History  Patient's last menstrual period was 02/28/2020 (approximate). Irregular  Estimated date of birth: 12/04/2019  Gestational age: 15 weeks 4 days  Contraception: none  Last Pap: 02/2018. Results were: normal  Obstetric History  OB History  Gravida Para Term Preterm AB Living  1 0 0 0 0 0  SAB TAB Ectopic Multiple Live Births  0 0 0 0 0    # Outcome Date GA Lbr Len/2nd Weight Sex Delivery Anes PTL Lv  1 Current             Past Medical History:  Diagnosis Date  . Hypertension     Past Surgical History:  Procedure Laterality Date  . NO PAST SURGERIES      Current Outpatient Medications on File Prior to Visit  Medication Sig Dispense Refill  . aspirin EC 81 MG tablet Take 1 tablet (81 mg total) by mouth daily. 300 tablet 2  . metoprolol succinate (TOPROL-XL) 50 MG 24 hr tablet Take by mouth.    . Prenatal Vit-Fe Fumarate-FA (PRENATAL MULTIVITAMIN) TABS tablet Take 1 tablet by mouth daily at 12 noon.     No current facility-administered medications on file prior to visit.    Allergies  Allergen Reactions  . Zithromax [Azithromycin]     Respiratory Destress    Social History   Socioeconomic History  . Marital status: Married    Spouse name: Not on file  . Number of children: Not on file  . Years of education: Not on file  . Highest education level: Not on file  Occupational History  . Not on file  Tobacco Use  .  Smoking status: Never Smoker  . Smokeless tobacco: Never Used  Vaping Use  . Vaping Use: Never used  Substance and Sexual Activity  . Alcohol use: No  . Drug use: No  . Sexual activity: Yes    Partners: Male    Birth control/protection: I.U.D.    Comment: Mirena 05/02/2017  Other Topics Concern  . Not on file  Social History Narrative  . Not on file   Social Determinants of Health   Financial Resource Strain:   . Difficulty of Paying Living Expenses:   Food Insecurity:   . Worried About Charity fundraiser in the Last Year:   . Arboriculturist in the Last Year:   Transportation Needs:   . Film/video editor (Medical):   Marland Kitchen Lack of Transportation (Non-Medical):   Physical Activity:   . Days of Exercise per Week:   . Minutes of Exercise per Session:   Stress:   . Feeling of Stress :   Social Connections:   . Frequency of Communication with Friends and Family:   . Frequency of Social Gatherings with Friends and Family:   . Attends Religious Services:   . Active Member of Clubs or Organizations:   . Attends Archivist Meetings:   Marland Kitchen Marital Status:   Intimate Partner Violence:   .  Fear of Current or Ex-Partner:   . Emotionally Abused:   Marland Kitchen Physically Abused:   . Sexually Abused:     Family History  Problem Relation Age of Onset  . Diabetes Mother        "Pre Diabetes"  . Healthy Sister   . Hypertension Maternal Grandmother   . Healthy Sister   . Breast cancer Neg Hx   . Ovarian cancer Neg Hx   . Colon cancer Neg Hx     The following portions of the patient's history were reviewed and updated as appropriate: allergies, current medications, past family history, past medical history, past social history, past surgical history and problem list.  Review of Systems  ROS negative except as noted above. Information obtained from patient.   Objective:   BP 112/75   Pulse 87   Ht 5\' 8"  (1.727 m)   Wt 245 lb 8 oz (111.4 kg)   LMP 02/28/2020 (Approximate)    BMI 37.33 kg/m    CONSTITUTIONAL: Well-developed, well-nourished female in no acute distress.   PHYSICAL EXAM: Not indicated.    Assessment:   1. Abdominal cramping affecting pregnancy - Beta hCG quant (ref lab) - Progesterone  2. History of miscarriage - Beta hCG quant (ref lab) - Progesterone  3. Essential hypertension - Comprehensive metabolic panel - Uric acid  4. Nausea and vomiting in pregnancy - Beta hCG quant (ref lab) - Progesterone  5. Irregular menses - Beta hCG quant (ref lab) - Progesterone  6. Follow up    Plan:   Labs today, see orders.   First trimester education, see AVS.   Continue Aspirin and Labetalol as prescribed.   Reviewed red flag symptoms and when to call.   RTC x 2-3 weeks for dating/viability ultrasound & intake.  RTC x 7-8 weeks for NOB PE with JML.   Dani Gobble, CNM [facility], [facility] 04/07/20 12:31 PM  I spent 20 minutes dedicated to the care of this patient on the date of this encounter to include pre-visit review of records, face to face time with patient, and post visit ordering of testing.

## 2020-04-07 NOTE — Patient Instructions (Signed)
Morning Sickness  Morning sickness is when you feel sick to your stomach (nauseous) during pregnancy. You may feel sick to your stomach and throw up (vomit). You may feel sick in the morning, but you can feel this way at any time of day. Some women feel very sick to their stomach and cannot stop throwing up (hyperemesis gravidarum). Follow these instructions at home: Medicines  Take over-the-counter and prescription medicines only as told by your doctor. Do not take any medicines until you talk with your doctor about them first.  Taking multivitamins before getting pregnant can stop or lessen the harshness of morning sickness. Eating and drinking  Eat dry toast or crackers before getting out of bed.  Eat 5 or 6 small meals a day.  Eat dry and bland foods like rice and baked potatoes.  Do not eat greasy, fatty, or spicy foods.  Have someone cook for you if the smell of food causes you to feel sick or throw up.  If you feel sick to your stomach after taking prenatal vitamins, take them at night or with a snack.  Eat protein when you need a snack. Nuts, yogurt, and cheese are good choices.  Drink fluids throughout the day.  Try ginger ale made with real ginger, ginger tea made from fresh grated ginger, or ginger candies. General instructions  Do not use any products that have nicotine or tobacco in them, such as cigarettes and e-cigarettes. If you need help quitting, ask your doctor.  Use an air purifier to keep the air in your house free of smells.  Get lots of fresh air.  Try to avoid smells that make you feel sick.  Try: ? Wearing a bracelet that is used for seasickness (acupressure wristband). ? Going to a doctor who puts thin needles into certain body points (acupuncture) to improve how you feel. Contact a doctor if:  You need medicine to feel better.  You feel dizzy or light-headed.  You are losing weight. Get help right away if:  You feel very sick to your  stomach and cannot stop throwing up.  You pass out (faint).  You have very bad pain in your belly. Summary  Morning sickness is when you feel sick to your stomach (nauseous) during pregnancy.  You may feel sick in the morning, but you can feel this way at any time of day.  Making some changes to what you eat may help your symptoms go away. This information is not intended to replace advice given to you by your health care provider. Make sure you discuss any questions you have with your health care provider. Document Revised: 08/29/2017 Document Reviewed: 10/17/2016 Elsevier Patient Education  [place].   Hypertension During Pregnancy Hypertension is also called high blood pressure. High blood pressure means that the force of your blood moving in your body is too strong. It can cause problems for you and your baby. Different types of high blood pressure can happen during pregnancy. The types are:  High blood pressure before you got pregnant. This is called chronic hypertension.  This can continue during your pregnancy. Your doctor will want to keep checking your blood pressure. You may need medicine to keep your blood pressure under control while you are pregnant. You will need follow-up visits after you have your baby.  High blood pressure that goes up during pregnancy when it was normal before. This is called gestational hypertension. It will usually get better after you have your baby, but  your doctor will need to watch your blood pressure to make sure that it is getting better.  Very high blood pressure during pregnancy. This is called preeclampsia. Very high blood pressure is an emergency that needs to be checked and treated right away.  You may develop very high blood pressure after giving birth. This is called postpartum preeclampsia. This usually occurs within 48 hours after childbirth but may occur up to 6 weeks after giving birth. This is rare. How does this affect  me? If you have high blood pressure during pregnancy, you have a higher chance of developing high blood pressure:  As you get older.  If you get pregnant again. In some cases, high blood pressure during pregnancy can cause:  Stroke.  Heart attack.  Damage to the kidneys, lungs, or liver.  Preeclampsia.  Jerky movements you cannot control (convulsions or seizures).  Problems with the placenta. How does this affect my baby? Your baby may:  Be born early.  Not weigh as much as he or she should.  Not handle labor well, leading to a c-section birth. What are the risks?  Having high blood pressure during a past pregnancy.  Being overweight.  Being 77 years old or older.  Being pregnant for the first time.  Being pregnant with more than one baby.  Becoming pregnant using fertility methods, such as IVF.  Having other problems, such as diabetes, or kidney disease.  Having family members who have high blood pressure. What can I do to lower my risk?   Keep a healthy weight.  Eat a healthy diet.  Follow what your doctor tells you about treating any medical problems that you had before becoming pregnant. It is very important to go to all of your doctor visits. Your doctor will check your blood pressure and make sure that your pregnancy is progressing as it should. Treatment should start early if a problem is found. How is this treated? Treatment for high blood pressure during pregnancy can differ depending on the type of high blood pressure you have and how serious it is.  You may need to take blood pressure medicine.  If you have been taking medicine for your blood pressure, you may need to change the medicine during pregnancy if it is not safe for your baby.  If your doctor thinks that you could get very high blood pressure, he or she may tell you to take a low-dose aspirin during your pregnancy.  If you have very high blood pressure, you may need to stay in the  hospital so you and your baby can be watched closely. You may also need to take medicine to lower your blood pressure. This medicine may be given by mouth or through an IV tube.  In some cases, if your condition gets worse, you may need to have your baby early. Follow these instructions at home: Eating and drinking   Drink enough fluid to keep your pee (urine) pale yellow.  Avoid caffeine. Lifestyle  Do not use any products that contain nicotine or tobacco, such as cigarettes, e-cigarettes, and chewing tobacco. If you need help quitting, ask your doctor.  Do not use alcohol or drugs.  Avoid stress.  Rest and get plenty of sleep.  Regular exercise can help. Ask your doctor what kinds of exercise are best for you. General instructions  Take over-the-counter and prescription medicines only as told by your doctor.  Keep all prenatal and follow-up visits as told by your doctor. This is  important. Contact a doctor if:  You have symptoms that your doctor told you to watch for, such as: ? Headaches. ? Nausea. ? Vomiting. ? Belly (abdominal) pain. ? Dizziness. ? Light-headedness. Get help right away if:  You have: ? Very bad belly pain that does not get better with treatment. ? A very bad headache that does not get better. ? Vomiting that does not get better. ? Sudden, fast weight gain. ? Sudden swelling in your hands, ankles, or face. ? Bleeding from your vagina. ? Blood in your pee. ? Blurry vision. ? Double vision. ? Shortness of breath. ? Chest pain. ? Weakness on one side of your body. ? Trouble talking.  Your baby is not moving as much as usual. Summary  High blood pressure is also called hypertension.  High blood pressure means that the force of your blood moving in your body is too strong.  High blood pressure can cause problems for you and your baby.  Keep all follow-up visits as told by your doctor. This is important. This information is not intended to  replace advice given to you by your health care provider. Make sure you discuss any questions you have with your health care provider. Document Revised: 01/07/2019 Document Reviewed: 10/13/2018 Elsevier Patient Education  [place] Weight Gain During Pregnancy, Adult A certain amount of weight gain during pregnancy is normal and healthy. How much weight you should gain depends on your overall health and a measurement called BMI (body mass index). BMI is an estimate of your body fat based on your height and weight. You can use an Freight forwarder to figure out your BMI, or you can ask your health care provider to calculate it for you at your next visit. Your recommended pregnancy weight gain is based on your pre-pregnancy BMI. General guidelines for a healthy total weight gain during pregnancy are listed below. If your BMI at or before the start of your pregnancy is:  Less than 18.5 (underweight), you should gain 28-40 lb (13-18 kg).  18.5-24.9 (normal weight), you should gain 25-35 lb (11-16 kg).  25-29.9 (overweight), you should gain 15-25 lb (7-11 kg).  30 or higher (obese), you should gain 11-20 lb (5-9 kg). These ranges vary depending on your individual health. If you are carrying more than one baby (multiples), it may be safe to gain more weight than these recommendations. If you gain less weight than recommended, that may be safe as long as your baby is growing and developing normally. How can unhealthy weight gain affect me and my baby? Gaining too much weight during pregnancy can lead to pregnancy complications, such as:  A temporary form of diabetes that develops during pregnancy (gestational diabetes).  High blood pressure during pregnancy and protein in your urine (preeclampsia).  High blood pressure during pregnancy without protein in your urine (gestational hypertension).  Your baby having a high weight at birth, which may: ? Raise your risk of having a  more difficult delivery or a surgical delivery (cesarean delivery, or C-section). ? Raise your child's risk of developing obesity during childhood. Not gaining enough weight can be life-threatening for your baby, and it may raise your baby's chances of:  Being born early (preterm).  Growing more slowly than normal during pregnancy (growth restriction).  Having a low weight at birth. What actions can I take to gain a healthy amount of weight during pregnancy? General instructions  Keep track of your weight gain during pregnancy.  Take over-the-counter and prescription medicines only as told by your health care provider. Take all prenatal supplements as directed.  Keep all health care visits during pregnancy (prenatal visits). These visits are a good time to discuss your weight gain. Your health care provider will weigh you at each visit to make sure you are gaining a healthy amount of weight. Nutrition   Eat a balanced, nutrient-rich diet. Eat plenty of: ? Fruits and vegetables, such as berries and broccoli. ? Whole grains, such as millet, barley, whole-wheat breads and cereals, and oatmeal. ? Low-fat dairy products or non-dairy products such as almond milk or rice milk. ? Protein foods, such as lean meat, chicken, eggs, and legumes (such as peas, beans, soybeans, and lentils).  Avoid foods that are fried or have a lot of fat, salt (sodium), or sugar.  Drink enough fluid to keep your urine pale yellow.  Choose healthy snack and drink options when you are at work or on the go: ? Drink water. Avoid soda, sports drinks, and juices that have added sugar. ? Avoid drinks with caffeine, such as coffee and energy drinks. ? Eat snacks that are high in protein, such as nuts, protein bars, and low-fat yogurt. ? Carry convenient snacks in your purse that do not need refrigeration, such as a pack of trail mix, an apple, or a granola bar.  If you need help improving your diet, work with a health  care provider or a diet and nutrition specialist (dietitian). Activity   Exercise regularly, as told by your health care provider. ? If you were active before becoming pregnant, you may be able to continue your regular fitness activities. ? If you were not active before pregnancy, you may gradually build up to exercising for 30 or more minutes on most days of the week. This may include walking, swimming, or yoga.  Ask your health care provider what activities are safe for you. Talk with your health care provider about whether you may need to be excused from certain school or work activities. Where to find more information Learn more about managing your weight gain during pregnancy from:  American Pregnancy Association: www.americanpregnancy.org  [place] Department of Agriculture pregnancy weight gain calculator: [employer] Summary  Too much weight gain during pregnancy can lead to complications for you and your baby.  Find out your pre-pregnancy BMI to determine how much weight gain is healthy for you.  Eat nutritious foods and stay active.  Keep all of your prenatal visits as told by your health care provider. This information is not intended to replace advice given to you by your health care provider. Make sure you discuss any questions you have with your health care provider. Document Revised: 06/09/2019 Document Reviewed: 06/06/2017 Elsevier Patient Education  [place].   Exercise During Pregnancy Exercise is an important part of being healthy for people of all ages. Exercise improves the function of your heart and lungs and helps you maintain strength, flexibility, and a healthy body weight. Exercise also boosts energy levels and elevates mood. Most women should exercise regularly during pregnancy. In rare cases, women with certain medical conditions or complications may be asked to limit or avoid exercise during pregnancy. How does this affect me? Along with  maintaining general strength and flexibility, exercising during pregnancy can help:  Keep strength in muscles that are used during labor and childbirth.  Decrease low back pain.  Reduce symptoms of depression.  Control weight gain during pregnancy.  Reduce the risk of  needing insulin if you develop diabetes during pregnancy.  Decrease the risk of cesarean delivery.  Speed up your recovery after giving birth. How does this affect my baby? Exercise can help you have a healthy pregnancy. Exercise does not cause premature birth. It will not cause your baby to weigh less at birth. What exercises can I do? Many exercises are safe for you to do during pregnancy. Do a variety of exercises that safely increase your heart and breathing rates and help you build and maintain muscle strength. Do exercises exactly as told by your health care provider. You may do these exercises:  Walking or hiking.  Swimming.  Water aerobics.  Riding a stationary bike.  Strength training.  Modified yoga or Pilates. Tell your instructor that you are pregnant. Avoid overstretching, and avoid lying on your back for long periods of time.  Running or jogging. Only choose this type of exercise if you: ? Ran or jogged regularly before your pregnancy. ? Can run or jog and still talk in complete sentences. What exercises should I avoid? Depending on your level of fitness and whether you exercised regularly before your pregnancy, you may be told to limit high-intensity exercise. You can tell that you are exercising at a high intensity if you are breathing much harder and faster and cannot hold a conversation while exercising. You must avoid:  Contact sports.  Activities that put you at risk for falling on or being hit in the belly, such as downhill skiing, water skiing, surfing, rock climbing, cycling, gymnastics, and horseback riding.  Scuba diving.  Skydiving.  Yoga or Pilates in a room that is heated to high  temperatures.  Jogging or running, unless you ran or jogged regularly before your pregnancy. While jogging or running, you should always be able to talk in full sentences. Do not run or jog so fast that you are unable to have a conversation.  Do not exercise at more than 6,000 feet above sea level (high elevation) if you are not used to exercising at high elevation. How do I exercise in a safe way?   Avoid overheating. Do not exercise in very high temperatures.  Wear loose-fitting, breathable clothes.  Avoid dehydration. Drink enough water before, during, and after exercise to keep your urine pale yellow.  Avoid overstretching. Because of hormone changes during pregnancy, it is easy to overstretch muscles, tendons, and ligaments during pregnancy.  Start slowly and ask your health care provider to recommend the types of exercise that are safe for you.  Do not exercise to lose weight. Follow these instructions at home:  Exercise on most days or all days of the week. Try to exercise for 30 minutes a day, 5 days a week, unless your health care provider tells you not to.  If you actively exercised before your pregnancy and you are healthy, your health care provider may tell you to continue to do moderate to high-intensity exercise.  If you are just starting to exercise or did not exercise much before your pregnancy, your health care provider may tell you to do low to moderate-intensity exercise. Questions to ask your health care provider  Is exercise safe for me?  What are signs that I should stop exercising?  Does my health condition mean that I should not exercise during pregnancy?  When should I avoid exercising during pregnancy? Stop exercising and contact a health care provider if: You have any unusual symptoms, such as:  Mild contractions of the uterus or cramps  in the abdomen.  Dizziness that does not go away when you rest. Stop exercising and get help right away if: You  have any unusual symptoms, such as:  Sudden, severe pain in your low back or your belly.  Mild contractions of the uterus or cramps in the abdomen that do not improve with rest and drinking fluids.  Chest pain.  Bleeding or fluid leaking from your vagina.  Shortness of breath. These symptoms may represent a serious problem that is an emergency. Do not wait to see if the symptoms will go away. Get medical help right away. Call your local emergency services (911 in the [place] Do not drive yourself to the hospital. Summary  Most women should exercise regularly throughout pregnancy. In rare cases, women with certain medical conditions or complications may be asked to limit or avoid exercise during pregnancy.  Do not exercise to lose weight during pregnancy.  Your health care provider will tell you what level of physical activity is right for you.  Stop exercising and contact a health care provider if you have mild contractions of the uterus or cramps in the abdomen. Get help right away if these contractions or cramps do not improve with rest and drinking fluids.  Stop exercising and get help right away if you have sudden, severe pain in your low back or belly, chest pain, shortness of breath, or bleeding or leaking of fluid from your vagina. This information is not intended to replace advice given to you by your health care provider. Make sure you discuss any questions you have with your health care provider. Document Revised: 01/07/2019 Document Reviewed: 10/21/2018 Elsevier Patient Education  [place].   Common Medications Safe in Pregnancy  Acne:      Constipation:  Benzoyl Peroxide     Colace  Clindamycin      Dulcolax Suppository  Topica Erythromycin     Fibercon  Salicylic Acid      Metamucil         Miralax AVOID:        Senakot   Accutane    Cough:  Retin-A       Cough Drops  Tetracycline      Phenergan w/ Codeine if Rx  Minocycline      Robitussin (Plain &  DM)  Antibiotics:     Crabs/Lice:  Ceclor       RID  Cephalosporins    AVOID:  E-Mycins      Kwell  Keflex  Macrobid/Macrodantin   Diarrhea:  Penicillin      Kao-Pectate  Zithromax      Imodium AD         PUSH FLUIDS AVOID:       Cipro     Fever:  Tetracycline      Tylenol (Regular or Extra  Minocycline       Strength)  Levaquin      Extra Strength-Do not          Exceed 8 tabs/24 hrs Caffeine:        <229m/day (equiv. To 1 cup of coffee or  approx. 3 12 oz sodas)         Gas: Cold/Hayfever:       Gas-X  Benadryl      Mylicon  Claritin       Phazyme  **Claritin-D        Chlor-Trimeton    Headaches:  Dimetapp      ASA-Free Excedrin  Drixoral-Non-Drowsy  Cold Compress  Mucinex (Guaifenasin)     Tylenol (Regular or Extra  Sudafed/Sudafed-12 Hour     Strength)  **Sudafed PE Pseudoephedrine   Tylenol Cold & Sinus     Vicks Vapor Rub  Zyrtec  **AVOID if Problems With Blood Pressure         Heartburn: Avoid lying down for at least 1 hour after meals  Aciphex      Maalox     Rash:  Milk of Magnesia     Benadryl    Mylanta       1% Hydrocortisone Cream  Pepcid  Pepcid Complete   Sleep Aids:  Prevacid      Ambien   Prilosec       Benadryl  Rolaids       Chamomile Tea  Tums (Limit 4/day)     Unisom  Zantac       Tylenol PM         Warm milk-add vanilla or  Hemorrhoids:       Sugar for taste  Anusol/Anusol H.C.  (RX: Analapram 2.5%)  Sugar Substitutes:  Hydrocortisone OTC     Ok in moderation  Preparation H      Tucks        Vaseline lotion applied to tissue with wiping    Herpes:     Throat:  Acyclovir      Oragel  Famvir  Valtrex     Vaccines:         Flu Shot Leg Cramps:       *Gardasil  Benadryl      Hepatitis A         Hepatitis B Nasal Spray:       Pneumovax  Saline Nasal Spray     Polio Booster         Tetanus Nausea:       Tuberculosis test or PPD  Vitamin B6 25 mg TID   AVOID:    Dramamine      *Gardasil  Emetrol       Live  Poliovirus  Ginger Root 250 mg QID    MMR (measles, mumps &  High Complex Carbs @ Bedtime    rebella)  Sea Bands-Accupressure    Varicella (Chickenpox)  Unisom 1/2 tab TID     *No known complications           If received before Pain:         Known pregnancy;   Darvocet       Resume series after  Lortab        Delivery  Percocet    Yeast:   Tramadol      Femstat  Tylenol 3      Gyne-lotrimin  Ultram       Monistat  Vicodin           MISC:         All Sunscreens           Hair Coloring/highlights          Insect Repellant's          (Including DEET)         Mystic Tans   First Trimester of Pregnancy  The first trimester of pregnancy is from week 1 until the end of week 13 (months 1 through 3). During this time, your baby will begin to develop inside you. At 6-8 weeks, the eyes and face are formed, and the heartbeat can be seen on ultrasound. At  the end of 12 weeks, all the baby's organs are formed. Prenatal care is all the medical care you receive before the birth of your baby. Make sure you get good prenatal care and follow all of your doctor's instructions. Follow these instructions at home: Medicines  Take over-the-counter and prescription medicines only as told by your doctor. Some medicines are safe and some medicines are not safe during pregnancy.  Take a prenatal vitamin that contains at least 600 micrograms (mcg) of folic acid.  If you have trouble pooping (constipation), take medicine that will make your stool soft (stool softener) if your doctor approves. Eating and drinking   Eat regular, healthy meals.  Your doctor will tell you the amount of weight gain that is right for you.  Avoid raw meat and uncooked cheese.  If you feel sick to your stomach (nauseous) or throw up (vomit): ? Eat 4 or 5 small meals a day instead of 3 large meals. ? Try eating a few soda crackers. ? Drink liquids between meals instead of during meals.  To prevent constipation: ? Eat foods  that are high in fiber, like fresh fruits and vegetables, whole grains, and beans. ? Drink enough fluids to keep your pee (urine) clear or pale yellow. Activity  Exercise only as told by your doctor. Stop exercising if you have cramps or pain in your lower belly (abdomen) or low back.  Do not exercise if it is too hot, too humid, or if you are in a place of great height (high altitude).  Try to avoid standing for long periods of time. Move your legs often if you must stand in one place for a long time.  Avoid heavy lifting.  Wear low-heeled shoes. Sit and stand up straight.  You can have sex unless your doctor tells you not to. Relieving pain and discomfort  Wear a good support bra if your breasts are sore.  Take warm water baths (sitz baths) to soothe pain or discomfort caused by hemorrhoids. Use hemorrhoid cream if your doctor says it is okay.  Rest with your legs raised if you have leg cramps or low back pain.  If you have puffy, bulging veins (varicose veins) in your legs: ? Wear support hose or compression stockings as told by your doctor. ? Raise (elevate) your feet for 15 minutes, 3-4 times a day. ? Limit salt in your food. Prenatal care  Schedule your prenatal visits by the twelfth week of pregnancy.  Write down your questions. Take them to your prenatal visits.  Keep all your prenatal visits as told by your doctor. This is important. Safety  Wear your seat belt at all times when driving.  Make a list of emergency phone numbers. The list should include numbers for family, friends, the hospital, and police and fire departments. General instructions  Ask your doctor for a referral to a local prenatal class. Begin classes no later than at the start of month 6 of your pregnancy.  Ask for help if you need counseling or if you need help with nutrition. Your doctor can give you advice or tell you where to go for help.  Do not use hot tubs, steam rooms, or saunas.  Do  not douche or use tampons or scented sanitary pads.  Do not cross your legs for long periods of time.  Avoid all herbs and alcohol. Avoid drugs that are not approved by your doctor.  Do not use any tobacco products, including cigarettes, chewing tobacco, and  electronic cigarettes. If you need help quitting, ask your doctor. You may get counseling or other support to help you quit.  Avoid cat litter boxes and soil used by cats. These carry germs that can cause birth defects in the baby and can cause a loss of your baby (miscarriage) or stillbirth.  Visit your dentist. At home, brush your teeth with a soft toothbrush. Be gentle when you floss. Contact a doctor if:  You are dizzy.  You have mild cramps or pressure in your lower belly.  You have a nagging pain in your belly area.  You continue to feel sick to your stomach, you throw up, or you have watery poop (diarrhea).  You have a bad smelling fluid coming from your vagina.  You have pain when you pee (urinate).  You have increased puffiness (swelling) in your face, hands, legs, or ankles. Get help right away if:  You have a fever.  You are leaking fluid from your vagina.  You have spotting or bleeding from your vagina.  You have very bad belly cramping or pain.  You gain or lose weight rapidly.  You throw up blood. It may look like coffee grounds.  You are around people who have [place] measles, fifth disease, or chickenpox.  You have a very bad headache.  You have shortness of breath.  You have any kind of trauma, such as from a fall or a car accident. Summary  The first trimester of pregnancy is from week 1 until the end of week 13 (months 1 through 3).  To take care of yourself and your unborn baby, you will need to eat healthy meals, take medicines only if your doctor tells you to do so, and do activities that are safe for you and your baby.  Keep all follow-up visits as told by your doctor. This is important  as your doctor will have to ensure that your baby is healthy and growing well. This information is not intended to replace advice given to you by your health care provider. Make sure you discuss any questions you have with your health care provider. Document Revised: 01/07/2019 Document Reviewed: 09/24/2016 Elsevier Patient Education  2020 [employer].

## 2020-04-08 LAB — COMPREHENSIVE METABOLIC PANEL
ALT: 17 IU/L (ref 0–32)
AST: 17 IU/L (ref 0–40)
Albumin/Globulin Ratio: 1.3 (ref 1.2–2.2)
Albumin: 4 g/dL (ref 3.9–5.0)
Alkaline Phosphatase: 109 IU/L (ref 48–121)
BUN/Creatinine Ratio: 11 (ref 9–23)
BUN: 8 mg/dL (ref 6–20)
Bilirubin Total: 0.6 mg/dL (ref 0.0–1.2)
CO2: 20 mmol/L (ref 20–29)
Calcium: 8.9 mg/dL (ref 8.7–10.2)
Chloride: 106 mmol/L (ref 96–106)
Creatinine, Ser: 0.71 mg/dL (ref 0.57–1.00)
GFR calc Af Amer: 139 mL/min/{1.73_m2} (ref >59)
GFR calc non Af Amer: 120 mL/min/{1.73_m2} (ref >59)
Globulin, Total: 3 g/dL (ref 1.5–4.5)
Glucose: 77 mg/dL (ref 65–99)
Potassium: 3.7 mmol/L (ref 3.5–5.2)
Sodium: 140 mmol/L (ref 134–144)
Total Protein: 7 g/dL (ref 6.0–8.5)

## 2020-04-08 LAB — URIC ACID: Uric Acid: 3.7 mg/dL (ref 2.6–6.2)

## 2020-04-08 LAB — PROGESTERONE: Progesterone: 9.9 ng/mL

## 2020-04-08 LAB — BETA HCG QUANT (REF LAB): hCG Quant: 352 m[IU]/mL

## 2020-04-10 NOTE — Progress Notes (Signed)
Please contact patient to schedule lab appointment on Friday. Thanks, JML

## 2020-04-14 ENCOUNTER — Other Ambulatory Visit: Payer: Managed Care, Other (non HMO)

## 2020-04-14 DIAGNOSIS — O200 Threatened abortion: Secondary | ICD-10-CM

## 2020-04-15 LAB — BETA HCG QUANT (REF LAB): hCG Quant: 5294 m[IU]/mL

## 2020-04-18 ENCOUNTER — Telehealth: Payer: Self-pay | Admitting: Certified Nurse Midwife

## 2020-04-18 NOTE — Telephone Encounter (Signed)
Called pt [facility] for reschedule u/s and nurse visit

## 2020-04-27 NOTE — Telephone Encounter (Signed)
Patient called in to speak with her providers nurse regarding what she can take for cold/sinus infection being pregnant. Informed patient of the medications she would be able to take while pregnant. Those being:  Benadryl, Claritin D, Chlor-Trimeton, Dimetapp, Drixoral- Non-Drowsy, Mucinex, Sudafed, Tylenol Colf and Sinus, Vicks Vapor Rub,and Zyrtec.    Informed patient that she could find all this information in her new pregnancy packet.

## 2020-05-04 ENCOUNTER — Other Ambulatory Visit: Payer: Managed Care, Other (non HMO)

## 2020-05-11 ENCOUNTER — Other Ambulatory Visit: Payer: Self-pay

## 2020-05-11 ENCOUNTER — Ambulatory Visit (INDEPENDENT_AMBULATORY_CARE_PROVIDER_SITE_OTHER): Payer: 59 | Admitting: Certified Nurse Midwife

## 2020-05-11 ENCOUNTER — Ambulatory Visit (INDEPENDENT_AMBULATORY_CARE_PROVIDER_SITE_OTHER): Payer: 59

## 2020-05-11 VITALS — BP 116/79 | HR 79 | Ht 68.0 in | Wt 247.9 lb

## 2020-05-11 DIAGNOSIS — Z8759 Personal history of other complications of pregnancy, childbirth and the puerperium: Secondary | ICD-10-CM

## 2020-05-11 DIAGNOSIS — O219 Vomiting of pregnancy, unspecified: Secondary | ICD-10-CM | POA: Diagnosis not present

## 2020-05-11 DIAGNOSIS — O26899 Other specified pregnancy related conditions, unspecified trimester: Secondary | ICD-10-CM | POA: Diagnosis not present

## 2020-05-11 DIAGNOSIS — Z113 Encounter for screening for infections with a predominantly sexual mode of transmission: Secondary | ICD-10-CM

## 2020-05-11 DIAGNOSIS — R638 Other symptoms and signs concerning food and fluid intake: Secondary | ICD-10-CM

## 2020-05-11 DIAGNOSIS — I10 Essential (primary) hypertension: Secondary | ICD-10-CM | POA: Diagnosis not present

## 2020-05-11 DIAGNOSIS — R109 Unspecified abdominal pain: Secondary | ICD-10-CM

## 2020-05-11 DIAGNOSIS — N926 Irregular menstruation, unspecified: Secondary | ICD-10-CM

## 2020-05-11 DIAGNOSIS — Z3A10 10 weeks gestation of pregnancy: Secondary | ICD-10-CM

## 2020-05-11 DIAGNOSIS — Z3401 Encounter for supervision of normal first pregnancy, first trimester: Secondary | ICD-10-CM | POA: Diagnosis not present

## 2020-05-11 DIAGNOSIS — Z3A09 9 weeks gestation of pregnancy: Secondary | ICD-10-CM

## 2020-05-11 DIAGNOSIS — Z0283 Encounter for blood-alcohol and blood-drug test: Secondary | ICD-10-CM

## 2020-05-11 LAB — OB RESULTS CONSOLE VARICELLA ZOSTER ANTIBODY, IGG: Varicella: NON-IMMUNE/NOT IMMUNE

## 2020-05-11 LAB — OB RESULTS CONSOLE GC/CHLAMYDIA: Gonorrhea: NEGATIVE

## 2020-05-11 NOTE — Patient Instructions (Signed)
Second Trimester of Pregnancy  The second trimester is from week 14 through week 27 (month 4 through 6). This is often the time in pregnancy that you feel your best. Often times, morning sickness has lessened or quit. You may have more energy, and you may get hungry more often. Your unborn baby is growing rapidly. At the end of the sixth month, he or she is about 9 inches long and weighs about 1 pounds. You will likely feel the baby move between 18 and 20 weeks of pregnancy. Follow these instructions at home: Medicines  Take over-the-counter and prescription medicines only as told by your doctor. Some medicines are safe and some medicines are not safe during pregnancy.  Take a prenatal vitamin that contains at least 600 micrograms (mcg) of folic acid.  If you have trouble pooping (constipation), take medicine that will make your stool soft (stool softener) if your doctor approves. Eating and drinking   Eat regular, healthy meals.  Avoid raw meat and uncooked cheese.  If you get low calcium from the food you eat, talk to your doctor about taking a daily calcium supplement.  Avoid foods that are high in fat and sugars, such as fried and sweet foods.  If you feel sick to your stomach (nauseous) or throw up (vomit): ? Eat 4 or 5 small meals a day instead of 3 large meals. ? Try eating a few soda crackers. ? Drink liquids between meals instead of during meals.  To prevent constipation: ? Eat foods that are high in fiber, like fresh fruits and vegetables, whole grains, and beans. ? Drink enough fluids to keep your pee (urine) clear or pale yellow. Activity  Exercise only as told by your doctor. Stop exercising if you start to have cramps.  Do not exercise if it is too hot, too humid, or if you are in a place of great height (high altitude).  Avoid heavy lifting.  Wear low-heeled shoes. Sit and stand up straight.  You can continue to have sex unless your doctor tells you not  to. Relieving pain and discomfort  Wear a good support bra if your breasts are tender.  Take warm water baths (sitz baths) to soothe pain or discomfort caused by hemorrhoids. Use hemorrhoid cream if your doctor approves.  Rest with your legs raised if you have leg cramps or low back pain.  If you develop puffy, bulging veins (varicose veins) in your legs: ? Wear support hose or compression stockings as told by your doctor. ? Raise (elevate) your feet for 15 minutes, 3-4 times a day. ? Limit salt in your food. Prenatal care  Write down your questions. Take them to your prenatal visits.  Keep all your prenatal visits as told by your doctor. This is important. Safety  Wear your seat belt when driving.  Make a list of emergency phone numbers, including numbers for family, friends, the hospital, and police and fire departments. General instructions  Ask your doctor about the right foods to eat or for help finding a counselor, if you need these services.  Ask your doctor about local prenatal classes. Begin classes before month 6 of your pregnancy.  Do not use hot tubs, steam rooms, or saunas.  Do not douche or use tampons or scented sanitary pads.  Do not cross your legs for long periods of time.  Visit your dentist if you have not done so. Use a soft toothbrush to brush your teeth. Floss gently.  Avoid all smoking, herbs,   and alcohol. Avoid drugs that are not approved by your doctor.  Do not use any products that contain nicotine or tobacco, such as cigarettes and e-cigarettes. If you need help quitting, ask your doctor.  Avoid cat litter boxes and soil used by cats. These carry germs that can cause birth defects in the baby and can cause a loss of your baby (miscarriage) or stillbirth. Contact a doctor if:  You have mild cramps or pressure in your lower belly.  You have pain when you pee (urinate).  You have bad smelling fluid coming from your vagina.  You continue to  feel sick to your stomach (nauseous), throw up (vomit), or have watery poop (diarrhea).  You have a nagging pain in your belly area.  You feel dizzy. Get help right away if:  You have a fever.  You are leaking fluid from your vagina.  You have spotting or bleeding from your vagina.  You have severe belly cramping or pain.  You lose or gain weight rapidly.  You have trouble catching your breath and have chest pain.  You notice sudden or extreme puffiness (swelling) of your face, hands, ankles, feet, or legs.  You have not felt the baby move in over an hour.  You have severe headaches that do not go away when you take medicine.  You have trouble seeing. Summary  The second trimester is from week 14 through week 27 (months 4 through 6). This is often the time in pregnancy that you feel your best.  To take care of yourself and your unborn baby, you will need to eat healthy meals, take medicines only if your doctor tells you to do so, and do activities that are safe for you and your baby.  Call your doctor if you get sick or if you notice anything unusual about your pregnancy. Also, call your doctor if you need help with the right food to eat, or if you want to know what activities are safe for you. This information is not intended to replace advice given to you by your health care provider. Make sure you discuss any questions you have with your health care provider. Document Revised: 01/08/2019 Document Reviewed: 10/22/2016 Elsevier Patient Education  [place]. Morning Sickness  Morning sickness is when you feel sick to your stomach (nauseous) during pregnancy. You may feel sick to your stomach and throw up (vomit). You may feel sick in the morning, but you can feel this way at any time of day. Some women feel very sick to their stomach and cannot stop throwing up (hyperemesis gravidarum). Follow these instructions at home: Medicines  Take over-the-counter and  prescription medicines only as told by your doctor. Do not take any medicines until you talk with your doctor about them first.  Taking multivitamins before getting pregnant can stop or lessen the harshness of morning sickness. Eating and drinking  Eat dry toast or crackers before getting out of bed.  Eat 5 or 6 small meals a day.  Eat dry and bland foods like rice and baked potatoes.  Do not eat greasy, fatty, or spicy foods.  Have someone cook for you if the smell of food causes you to feel sick or throw up.  If you feel sick to your stomach after taking prenatal vitamins, take them at night or with a snack.  Eat protein when you need a snack. Nuts, yogurt, and cheese are good choices.  Drink fluids throughout the day.  Try ginger ale  made with real ginger, ginger tea made from fresh grated ginger, or ginger candies. General instructions  Do not use any products that have nicotine or tobacco in them, such as cigarettes and e-cigarettes. If you need help quitting, ask your doctor.  Use an air purifier to keep the air in your house free of smells.  Get lots of fresh air.  Try to avoid smells that make you feel sick.  Try: ? Wearing a bracelet that is used for seasickness (acupressure wristband). ? Going to a doctor who puts thin needles into certain body points (acupuncture) to improve how you feel. Contact a doctor if:  You need medicine to feel better.  You feel dizzy or light-headed.  You are losing weight. Get help right away if:  You feel very sick to your stomach and cannot stop throwing up.  You pass out (faint).  You have very bad pain in your belly. Summary  Morning sickness is when you feel sick to your stomach (nauseous) during pregnancy.  You may feel sick in the morning, but you can feel this way at any time of day.  Making some changes to what you eat may help your symptoms go away. This information is not intended to replace advice given to you  by your health care provider. Make sure you discuss any questions you have with your health care provider. Document Revised: 08/29/2017 Document Reviewed: 10/17/2016 Elsevier Patient Education  2020 [employer]. How a Baby Grows During Pregnancy  Pregnancy begins when a female's sperm enters a female's egg (fertilization). Fertilization usually happens in one of the tubes (fallopian tubes) that connect the ovaries to the womb (uterus). The fertilized egg moves down the fallopian tube to the uterus. Once it reaches the uterus, it implants into the lining of the uterus and begins to grow. For the first 10 weeks, the fertilized egg is called an embryo. After 10 weeks, it is called a fetus. As the fetus continues to grow, it receives oxygen and nutrients through tissue (placenta) that grows to support the developing baby. The placenta is the life support system for the baby. It provides oxygen and nutrition and removes waste. Learning as much as you can about your pregnancy and how your baby is developing can help you enjoy the experience. It can also make you aware of when there might be a problem and when to ask questions. How long does a typical pregnancy last? A pregnancy usually lasts 280 days, or about 40 weeks. Pregnancy is divided into three periods of growth, also called trimesters:  First trimester: 0-12 weeks.  Second trimester: 13-27 weeks.  Third trimester: 28-40 weeks. The day when your baby is ready to be born (full term) is your estimated date of delivery. How does my baby develop month by month? First month  The fertilized egg attaches to the inside of the uterus.  Some cells will form the placenta. Others will form the fetus.  The arms, legs, brain, spinal cord, lungs, and heart begin to develop.  At the end of the first month, the heart begins to beat. Second month  The bones, inner ear, eyelids, hands, and feet form.  The genitals develop.  By the end of 8 weeks, all  major organs are developing. Third month  All of the internal organs are forming.  Teeth develop below the gums.  Bones and muscles begin to grow. The spine can flex.  The skin is transparent.  Fingernails and toenails begin to form.  Arms and legs continue to grow longer, and hands and feet develop.  The fetus is about 3 inches (7.6 cm) long. Fourth month  The placenta is completely formed.  The external sex organs, neck, outer ear, eyebrows, eyelids, and fingernails are formed.  The fetus can hear, swallow, and move its arms and legs.  The kidneys begin to produce urine.  The skin is covered with a white, waxy coating (vernix) and very fine hair (lanugo). Fifth month  The fetus moves around more and can be felt for the first time (quickening).  The fetus starts to sleep and wake up and may begin to suck its finger.  The nails grow to the end of the fingers.  The organ in the digestive system that makes bile (gallbladder) functions and helps to digest nutrients.  If your baby is a girl, eggs are present in her ovaries. If your baby is a boy, testicles start to move down into his scrotum. Sixth month  The lungs are formed.  The eyes open. The brain continues to develop.  Your baby has fingerprints and toe prints. Your baby's hair grows thicker.  At the end of the second trimester, the fetus is about 9 inches (22.9 cm) long. Seventh month  The fetus kicks and stretches.  The eyes are developed enough to sense changes in light.  The hands can make a grasping motion.  The fetus responds to sound. Eighth month  All organs and body systems are fully developed and functioning.  Bones harden, and taste buds develop. The fetus may hiccup.  Certain areas of the brain are still developing. The skull remains soft. Ninth month  The fetus gains about  lb (0.23 kg) each week.  The lungs are fully developed.  Patterns of sleep develop.  The fetus's head  typically moves into a head-down position (vertex) in the uterus to prepare for birth.  The fetus weighs 6-9 lb (2.72-4.08 kg) and is 19-20 inches (48.26-50.8 cm) long. What can I do to have a healthy pregnancy and help my baby develop? General instructions  Take prenatal vitamins as directed by your health care provider. These include vitamins such as folic acid, iron, calcium, and vitamin D. They are important for healthy development.  Take medicines only as directed by your health care provider. Read labels and ask a pharmacist or your health care provider whether over-the-counter medicines, supplements, and prescription drugs are safe to take during pregnancy.  Keep all follow-up visits as directed by your health care provider. This is important. Follow-up visits include prenatal care and screening tests. How do I know if my baby is developing well? At each prenatal visit, your health care provider will do several different tests to check on your health and keep track of your baby's development. These include:  Fundal height and position. ? Your health care provider will measure your growing belly from your pubic bone to the top of the uterus using a tape measure. ? Your health care provider will also feel your belly to determine your baby's position.  Heartbeat. ? An ultrasound in the first trimester can confirm pregnancy and show a heartbeat, depending on how far along you are. ? Your health care provider will check your baby's heart rate at every prenatal visit.  Second trimester ultrasound. ? This ultrasound checks your baby's development. It also may show your baby's gender. What should I do if I have concerns about my baby's development? Always talk with your health care provider about  any concerns that you may have about your pregnancy and your baby. Summary  A pregnancy usually lasts 280 days, or about 40 weeks. Pregnancy is divided into three periods of growth, also called  trimesters.  Your health care provider will monitor your baby's growth and development throughout your pregnancy.  Follow your health care provider's recommendations about taking prenatal vitamins and medicines during your pregnancy.  Talk with your health care provider if you have any concerns about your pregnancy or your developing baby. This information is not intended to replace advice given to you by your health care provider. Make sure you discuss any questions you have with your health care provider. Document Revised: 01/07/2019 Document Reviewed: 07/30/2017 Elsevier Patient Education  2020 [place] of Pregnancy  The first trimester of pregnancy is from week 1 until the end of week 13 (months 1 through 3). During this time, your baby will begin to develop inside you. At 6-8 weeks, the eyes and face are formed, and the heartbeat can be seen on ultrasound. At the end of 12 weeks, all the baby's organs are formed. Prenatal care is all the medical care you receive before the birth of your baby. Make sure you get good prenatal care and follow all of your doctor's instructions. Follow these instructions at home: Medicines  Take over-the-counter and prescription medicines only as told by your doctor. Some medicines are safe and some medicines are not safe during pregnancy.  Take a prenatal vitamin that contains at least 600 micrograms (mcg) of folic acid.  If you have trouble pooping (constipation), take medicine that will make your stool soft (stool softener) if your doctor approves. Eating and drinking   Eat regular, healthy meals.  Your doctor will tell you the amount of weight gain that is right for you.  Avoid raw meat and uncooked cheese.  If you feel sick to your stomach (nauseous) or throw up (vomit): ? Eat 4 or 5 small meals a day instead of 3 large meals. ? Try eating a few soda crackers. ? Drink liquids between meals instead of during meals.  To  prevent constipation: ? Eat foods that are high in fiber, like fresh fruits and vegetables, whole grains, and beans. ? Drink enough fluids to keep your pee (urine) clear or pale yellow. Activity  Exercise only as told by your doctor. Stop exercising if you have cramps or pain in your lower belly (abdomen) or low back.  Do not exercise if it is too hot, too humid, or if you are in a place of great height (high altitude).  Try to avoid standing for long periods of time. Move your legs often if you must stand in one place for a long time.  Avoid heavy lifting.  Wear low-heeled shoes. Sit and stand up straight.  You can have sex unless your doctor tells you not to. Relieving pain and discomfort  Wear a good support bra if your breasts are sore.  Take warm water baths (sitz baths) to soothe pain or discomfort caused by hemorrhoids. Use hemorrhoid cream if your doctor says it is okay.  Rest with your legs raised if you have leg cramps or low back pain.  If you have puffy, bulging veins (varicose veins) in your legs: ? Wear support hose or compression stockings as told by your doctor. ? Raise (elevate) your feet for 15 minutes, 3-4 times a day. ? Limit salt in your food. Prenatal care  Schedule your prenatal visits by  the twelfth week of pregnancy.  Write down your questions. Take them to your prenatal visits.  Keep all your prenatal visits as told by your doctor. This is important. Safety  Wear your seat belt at all times when driving.  Make a list of emergency phone numbers. The list should include numbers for family, friends, the hospital, and police and fire departments. General instructions  Ask your doctor for a referral to a local prenatal class. Begin classes no later than at the start of month 6 of your pregnancy.  Ask for help if you need counseling or if you need help with nutrition. Your doctor can give you advice or tell you where to go for help.  Do not use hot  tubs, steam rooms, or saunas.  Do not douche or use tampons or scented sanitary pads.  Do not cross your legs for long periods of time.  Avoid all herbs and alcohol. Avoid drugs that are not approved by your doctor.  Do not use any tobacco products, including cigarettes, chewing tobacco, and electronic cigarettes. If you need help quitting, ask your doctor. You may get counseling or other support to help you quit.  Avoid cat litter boxes and soil used by cats. These carry germs that can cause birth defects in the baby and can cause a loss of your baby (miscarriage) or stillbirth.  Visit your dentist. At home, brush your teeth with a soft toothbrush. Be gentle when you floss. Contact a doctor if:  You are dizzy.  You have mild cramps or pressure in your lower belly.  You have a nagging pain in your belly area.  You continue to feel sick to your stomach, you throw up, or you have watery poop (diarrhea).  You have a bad smelling fluid coming from your vagina.  You have pain when you pee (urinate).  You have increased puffiness (swelling) in your face, hands, legs, or ankles. Get help right away if:  You have a fever.  You are leaking fluid from your vagina.  You have spotting or bleeding from your vagina.  You have very bad belly cramping or pain.  You gain or lose weight rapidly.  You throw up blood. It may look like coffee grounds.  You are around people who have [place] measles, fifth disease, or chickenpox.  You have a very bad headache.  You have shortness of breath.  You have any kind of trauma, such as from a fall or a car accident. Summary  The first trimester of pregnancy is from week 1 until the end of week 13 (months 1 through 3).  To take care of yourself and your unborn baby, you will need to eat healthy meals, take medicines only if your doctor tells you to do so, and do activities that are safe for you and your baby.  Keep all follow-up visits as told  by your doctor. This is important as your doctor will have to ensure that your baby is healthy and growing well. This information is not intended to replace advice given to you by your health care provider. Make sure you discuss any questions you have with your health care provider. Document Revised: 01/07/2019 Document Reviewed: 09/24/2016 Elsevier Patient Education  2020 [employer]. Commonly Asked Questions During Pregnancy  Cats: A parasite can be excreted in cat feces.  To avoid exposure you need to have another person empty the little box.  If you must empty the litter box you will need to wear  gloves.  Wash your hands after handling your cat.  This parasite can also be found in raw or undercooked meat so this should also be avoided.  Colds, Sore Throats, Flu: Please check your medication sheet to see what you can take for symptoms.  If your symptoms are unrelieved by these medications please call the office.  Dental Work: Most any dental work [employer] recommends is permitted.  X-rays should only be taken during the first trimester if absolutely necessary.  Your abdomen should be shielded with a lead apron during all x-rays.  Please notify your provider prior to receiving any x-rays.  Novocaine is fine; gas is not recommended.  If your dentist requires a note from [place] prior to dental work please call the office and we will provide one for you.  Exercise: Exercise is an important part of staying healthy during your pregnancy.  You may continue most exercises you were accustomed to prior to pregnancy.  Later in your pregnancy you will most likely notice you have difficulty with activities requiring balance like riding a bicycle.  It is important that you listen to your body and avoid activities that put you at a higher risk of falling.  Adequate rest and staying well hydrated are a must!  If you have questions about the safety of specific activities ask your provider.    Exposure to Children  with illness: Try to avoid obvious exposure; report any symptoms to [place] when noted,  If you have chicken pos, red measles or mumps, you should be immune to these diseases.   Please do not take any vaccines while pregnant unless you have checked with your OB provider.  Fetal Movement: After 28 weeks we recommend you do "kick counts" twice daily.  Lie or sit down in a calm quiet environment and count your baby movements "kicks".  You should feel your baby at least 10 times per hour.  If you have not felt 10 kicks within the first hour get up, walk around and have something sweet to eat or drink then repeat for an additional hour.  If count remains less than 10 per hour notify your provider.  Fumigating: Follow your pest control agent's advice as to how long to stay out of your home.  Ventilate the area well before re-entering.  Hemorrhoids:   Most over-the-counter preparations can be used during pregnancy.  Check your medication to see what is safe to use.  It is important to use a stool softener or fiber in your diet and to drink lots of liquids.  If hemorrhoids seem to be getting worse please call the office.   Hot Tubs:  Hot tubs Jacuzzis and saunas are not recommended while pregnant.  These increase your internal body temperature and should be avoided.  Intercourse:  Sexual intercourse is safe during pregnancy as long as you are comfortable, unless otherwise advised by your provider.  Spotting may occur after intercourse; report any bright red bleeding that is heavier than spotting.  Labor:  If you know that you are in labor, please go to the hospital.  If you are unsure, please call the office and let us help you decide what to do.  Lifting, straining, etc:  If your job requires heavy lifting or straining please check with your provider for any limitations.  Generally, you should not lift items heavier than that you can lift simply with your hands and arms (no back muscles)  Painting:  Paint fumes  do not harm  your pregnancy, but may make you ill and should be avoided if possible.  Latex or water based paints have less odor than oils.  Use adequate ventilation while painting.  Permanents & Hair Color:  Chemicals in hair dyes are not recommended as they cause increase hair dryness which can increase hair loss during pregnancy.  " Highlighting" and permanents are allowed.  Dye may be absorbed differently and permanents may not hold as well during pregnancy.  Sunbathing:  Use a sunscreen, as skin burns easily during pregnancy.  Drink plenty of fluids; avoid over heating.  Tanning Beds:  Because their possible side effects are still unknown, tanning beds are not recommended.  Ultrasound Scans:  Routine ultrasounds are performed at approximately 20 weeks.  You will be able to see your baby's general anatomy an if you would like to know the gender this can usually be determined as well.  If it is questionable when you conceived you may also receive an ultrasound early in your pregnancy for dating purposes.  Otherwise ultrasound exams are not routinely performed unless there is a medical necessity.  Although you can request a scan we ask that you pay for it when conducted because insurance does not cover " patient request" scans.  Work: If your pregnancy proceeds without complications you may work until your due date, unless your physician or employer advises otherwise.  Round Ligament Pain/Pelvic Discomfort:  Sharp, shooting pains not associated with bleeding are fairly common, usually occurring in the second trimester of pregnancy.  They tend to be worse when standing up or when you remain standing for long periods of time.  These are the result of pressure of certain pelvic ligaments called "round ligaments".  Rest, Tylenol and heat seem to be the most effective relief.  As the womb and fetus grow, they rise out of the pelvis and the discomfort improves.  Please notify the office if your pain seems  different than that described.  It may represent a more serious condition.  Common Medications Safe in Pregnancy  Acne:      Constipation:  Benzoyl Peroxide     Colace  Clindamycin      Dulcolax Suppository  Topica Erythromycin     Fibercon  Salicylic Acid      Metamucil         Miralax AVOID:        Senakot   Accutane    Cough:  Retin-A       Cough Drops  Tetracycline      Phenergan w/ Codeine if Rx  Minocycline      Robitussin (Plain & DM)  Antibiotics:     Crabs/Lice:  Ceclor       RID  Cephalosporins    AVOID:  E-Mycins      Kwell  Keflex  Macrobid/Macrodantin   Diarrhea:  Penicillin      Kao-Pectate  Zithromax      Imodium AD         PUSH FLUIDS AVOID:       Cipro     Fever:  Tetracycline      Tylenol (Regular or Extra  Minocycline       Strength)  Levaquin      Extra Strength-Do not          Exceed 8 tabs/24 hrs Caffeine:        '200mg'$ /day (equiv. To 1 cup of coffee or  approx. 3 12 oz sodas)  Gas: Cold/Hayfever:       Gas-X  Benadryl      Mylicon  Claritin       Phazyme  **Claritin-D        Chlor-Trimeton    Headaches:  Dimetapp      ASA-Free Excedrin  Drixoral-Non-Drowsy     Cold Compress  Mucinex (Guaifenasin)     Tylenol (Regular or Extra  Sudafed/Sudafed-12 Hour     Strength)  **Sudafed PE Pseudoephedrine   Tylenol Cold & Sinus     Vicks Vapor Rub  Zyrtec  **AVOID if Problems With Blood Pressure         Heartburn: Avoid lying down for at least 1 hour after meals  Aciphex      Maalox     Rash:  Milk of Magnesia     Benadryl    Mylanta       1% Hydrocortisone Cream  Pepcid  Pepcid Complete   Sleep Aids:  Prevacid      Ambien   Prilosec       Benadryl  Rolaids       Chamomile Tea  Tums (Limit 4/day)     Unisom         Tylenol PM         Warm milk-add vanilla or  Hemorrhoids:       Sugar for taste  Anusol/Anusol H.C.  (RX: Analapram 2.5%)  Sugar Substitutes:  Hydrocortisone OTC     Ok in moderation  Preparation  H      Tucks        Vaseline lotion applied to tissue with wiping    Herpes:     Throat:  Acyclovir      Oragel  Famvir  Valtrex     Vaccines:         Flu Shot Leg Cramps:       *Gardasil  Benadryl      Hepatitis A         Hepatitis B Nasal Spray:       Pneumovax  Saline Nasal Spray     Polio Booster         Tetanus Nausea:       Tuberculosis test or PPD  Vitamin B6 25 mg TID   AVOID:    Dramamine      *Gardasil  Emetrol       Live Poliovirus  Ginger Root 250 mg QID    MMR (measles, mumps &  High Complex Carbs @ Bedtime    rebella)  Sea Bands-Accupressure    Varicella (Chickenpox)  Unisom 1/2 tab TID     *No known complications           If received before Pain:         Known pregnancy;   Darvocet       Resume series after  Lortab        Delivery  Percocet    Yeast:   Tramadol      Femstat  Tylenol 3      Gyne-lotrimin  Ultram       Monistat  Vicodin           MISC:         All Sunscreens           Hair Coloring/highlights          Insect Repellant's          (Including DEET)         Mystic Tans

## 2020-05-11 NOTE — Progress Notes (Signed)
Vernell Barrier presents for NOB nurse intake visit. Pregnancy confirmation done at [facility], 04/07/2020, with Lucretia Kern  G1.  P0.  LMP 02/28/2020.  EDD 12/04/2020.  [place] [redacted]w[redacted]d. Pregnancy education material explained and given.  0 cats in the home. 3 dogs in the home. NOB labs ordered. BMI greater than 30. TSH/HbgA1c. Sickle cell not ordered due to race. HIV and drug screen explained and ordered. Genetic screening discussed. Genetic testing; Unsure. Pt to discuss genetic testing with provider. PNV encouraged. Pt to follow up with provider in 2 .weeks for NOB physical.  Segundo, HIV/Drug screening form and [facility] FMLA form signed and reviewed with patient.

## 2020-05-12 ENCOUNTER — Encounter: Payer: Self-pay | Admitting: Certified Nurse Midwife

## 2020-05-12 DIAGNOSIS — Z6710 Type A blood, Rh positive: Secondary | ICD-10-CM | POA: Insufficient documentation

## 2020-05-12 DIAGNOSIS — O09899 Supervision of other high risk pregnancies, unspecified trimester: Secondary | ICD-10-CM | POA: Insufficient documentation

## 2020-05-12 DIAGNOSIS — Z2839 Other underimmunization status: Secondary | ICD-10-CM | POA: Insufficient documentation

## 2020-05-12 LAB — MICROSCOPIC EXAMINATION
Casts: NONE SEEN /lpf
Epithelial Cells (non renal): >10 /hpf — AB (ref 0–10)
RBC, Urine: NONE SEEN /hpf (ref 0–2)
WBC, UA: NONE SEEN /hpf (ref 0–5)

## 2020-05-12 LAB — URINALYSIS, ROUTINE W REFLEX MICROSCOPIC
Bilirubin, UA: NEGATIVE
Glucose, UA: NEGATIVE
Ketones, UA: NEGATIVE
Nitrite, UA: NEGATIVE
RBC, UA: NEGATIVE
Specific Gravity, UA: 1.023 (ref 1.005–1.030)
Urobilinogen, Ur: 0.2 mg/dL (ref 0.2–1.0)
pH, UA: 7.5 (ref 5.0–7.5)

## 2020-05-12 LAB — HEMOGLOBIN A1C
Est. average glucose Bld gHb Est-mCnc: 103 mg/dL
Hgb A1c MFr Bld: 5.2 % (ref 4.8–5.6)

## 2020-05-12 LAB — RPR: RPR Ser Ql: NONREACTIVE

## 2020-05-12 LAB — VARICELLA ZOSTER ANTIBODY, IGG: Varicella zoster IgG: <135 index — ABNORMAL LOW (ref >165)

## 2020-05-12 LAB — ABO AND RH: Rh Factor: POSITIVE

## 2020-05-12 LAB — HEPATITIS B SURFACE ANTIGEN: Hepatitis B Surface Ag: NEGATIVE

## 2020-05-12 LAB — TSH: TSH: 2.05 u[IU]/mL (ref 0.450–4.500)

## 2020-05-12 LAB — RUBELLA SCREEN: Rubella Antibodies, IGG: 1.66 index (ref >0.99)

## 2020-05-12 LAB — ANTIBODY SCREEN: Antibody Screen: NEGATIVE

## 2020-05-12 LAB — HIV ANTIBODY (ROUTINE TESTING W REFLEX): HIV Screen 4th Generation wRfx: NONREACTIVE

## 2020-05-13 LAB — GC/CHLAMYDIA PROBE AMP
Chlamydia trachomatis, NAA: NEGATIVE
Neisseria Gonorrhoeae by PCR: NEGATIVE

## 2020-05-13 LAB — CULTURE, OB URINE

## 2020-05-13 LAB — DRUG PROFILE, UR, 9 DRUGS (LABCORP)
Amphetamines, Urine: NEGATIVE ng/mL
Barbiturate Quant, Ur: NEGATIVE ng/mL
Benzodiazepine Quant, Ur: NEGATIVE ng/mL
Cannabinoid Quant, Ur: NEGATIVE ng/mL
Cocaine (Metab.): NEGATIVE ng/mL
Methadone Screen, Urine: NEGATIVE ng/mL
Opiate Quant, Ur: NEGATIVE ng/mL
PCP Quant, Ur: NEGATIVE ng/mL
Propoxyphene: NEGATIVE ng/mL

## 2020-05-13 LAB — URINE CULTURE, OB REFLEX

## 2020-05-13 LAB — NICOTINE SCREEN, URINE: Cotinine Ql Scrn, Ur: NEGATIVE ng/mL

## 2020-05-26 ENCOUNTER — Encounter: Payer: Self-pay | Admitting: Certified Nurse Midwife

## 2020-05-26 ENCOUNTER — Other Ambulatory Visit: Payer: Self-pay

## 2020-05-26 ENCOUNTER — Ambulatory Visit (INDEPENDENT_AMBULATORY_CARE_PROVIDER_SITE_OTHER): Payer: 59 | Admitting: Certified Nurse Midwife

## 2020-05-26 VITALS — BP 120/90 | HR 83 | Wt 243.5 lb

## 2020-05-26 DIAGNOSIS — Z3A12 12 weeks gestation of pregnancy: Secondary | ICD-10-CM | POA: Diagnosis not present

## 2020-05-26 DIAGNOSIS — O09899 Supervision of other high risk pregnancies, unspecified trimester: Secondary | ICD-10-CM | POA: Diagnosis not present

## 2020-05-26 DIAGNOSIS — Z3401 Encounter for supervision of normal first pregnancy, first trimester: Secondary | ICD-10-CM

## 2020-05-26 DIAGNOSIS — Z283 Underimmunization status: Secondary | ICD-10-CM

## 2020-05-26 DIAGNOSIS — O10919 Unspecified pre-existing hypertension complicating pregnancy, unspecified trimester: Secondary | ICD-10-CM | POA: Insufficient documentation

## 2020-05-26 LAB — POCT URINALYSIS DIPSTICK OB
Bilirubin, UA: NEGATIVE
Blood, UA: NEGATIVE
Glucose, UA: NEGATIVE
Ketones, UA: NEGATIVE
Leukocytes, UA: NEGATIVE
Nitrite, UA: NEGATIVE
POC,PROTEIN,UA: NEGATIVE
Spec Grav, UA: 1.015 (ref 1.010–1.025)
Urobilinogen, UA: 0.2 E.U./dL
pH, UA: 6 (ref 5.0–8.0)

## 2020-05-26 NOTE — Patient Instructions (Signed)
Healthy Weight Gain During Pregnancy, Adult A certain amount of weight gain during pregnancy is normal and healthy. How much weight you should gain depends on your overall health and a measurement called BMI (body mass index). BMI is an estimate of your body fat based on your height and weight. You can use an online calculator to figure out your BMI, or you can ask your health care provider to calculate it for you at your next visit. Your recommended pregnancy weight gain is based on your pre-pregnancy BMI. General guidelines for a healthy total weight gain during pregnancy are listed below. If your BMI at or before the start of your pregnancy is:  Less than 18.5 (underweight), you should gain 28-40 lb (13-18 kg).  18.5-24.9 (normal weight), you should gain 25-35 lb (11-16 kg).  25-29.9 (overweight), you should gain 15-25 lb (7-11 kg).  30 or higher (obese), you should gain 11-20 lb (5-9 kg). These ranges vary depending on your individual health. If you are carrying more than one baby (multiples), it may be safe to gain more weight than these recommendations. If you gain less weight than recommended, that may be safe as long as your baby is growing and developing normally. How can unhealthy weight gain affect me and my baby? Gaining too much weight during pregnancy can lead to pregnancy complications, such as:  A temporary form of diabetes that develops during pregnancy (gestational diabetes).  High blood pressure during pregnancy and protein in your urine (preeclampsia).  High blood pressure during pregnancy without protein in your urine (gestational hypertension).  Your baby having a high weight at birth, which may: ? Raise your risk of having a more difficult delivery or a surgical delivery (cesarean delivery, or C-section). ? Raise your child's risk of developing obesity during childhood. Not gaining enough weight can be life-threatening for your baby, and it may raise your baby's chances  of:  Being born early (preterm).  Growing more slowly than normal during pregnancy (growth restriction).  Having a low weight at birth. What actions can I take to gain a healthy amount of weight during pregnancy? General instructions  Keep track of your weight gain during pregnancy.  Take over-the-counter and prescription medicines only as told by your health care provider. Take all prenatal supplements as directed.  Keep all health care visits during pregnancy (prenatal visits). These visits are a good time to discuss your weight gain. Your health care provider will weigh you at each visit to make sure you are gaining a healthy amount of weight. Nutrition   Eat a balanced, nutrient-rich diet. Eat plenty of: ? Fruits and vegetables, such as berries and broccoli. ? Whole grains, such as millet, barley, whole-wheat breads and cereals, and oatmeal. ? Low-fat dairy products or non-dairy products such as almond milk or rice milk. ? Protein foods, such as lean meat, chicken, eggs, and legumes (such as peas, beans, soybeans, and lentils).  Avoid foods that are fried or have a lot of fat, salt (sodium), or sugar.  Drink enough fluid to keep your urine pale yellow.  Choose healthy snack and drink options when you are at work or on the go: ? Drink water. Avoid soda, sports drinks, and juices that have added sugar. ? Avoid drinks with caffeine, such as coffee and energy drinks. ? Eat snacks that are high in protein, such as nuts, protein bars, and low-fat yogurt. ? Carry convenient snacks in your purse that do not need refrigeration, such as a pack of   trail mix, an apple, or a granola bar.  If you need help improving your diet, work with a health care provider or a diet and nutrition specialist (dietitian). Activity   Exercise regularly, as told by your health care provider. ? If you were active before becoming pregnant, you may be able to continue your regular fitness activities. ? If  you were not active before pregnancy, you may gradually build up to exercising for 30 or more minutes on most days of the week. This may include walking, swimming, or yoga.  Ask your health care provider what activities are safe for you. Talk with your health care provider about whether you may need to be excused from certain school or work activities. Where to find more information Learn more about managing your weight gain during pregnancy from:  American Pregnancy Association: www.americanpregnancy.org  [place] Department [employer] Agriculture pregnancy weight gain calculator: [employer] Summary  Too much weight gain during pregnancy can lead to complications for you and your baby.  Find out your pre-pregnancy BMI to determine how much weight gain is healthy for you.  Eat nutritious foods and stay active.  Keep all of your prenatal visits as told by your health care provider. This information is not intended to replace advice given to you by your health care provider. Make sure you discuss any questions you have with your health care provider. Document Revised: 06/09/2019 Document Reviewed: 06/06/2017 Elsevier Patient Education  [place].   Exercise During Pregnancy Exercise is an important part of being healthy for people of all ages. Exercise improves the function of your heart and lungs and helps you maintain strength, flexibility, and a healthy body weight. Exercise also boosts energy levels and elevates mood. Most women should exercise regularly during pregnancy. In rare cases, women with certain medical conditions or complications may be asked to limit or avoid exercise during pregnancy. How does this affect me? Along with maintaining general strength and flexibility, exercising during pregnancy can help:  Keep strength in muscles that are used during labor and childbirth.  Decrease low back pain.  Reduce symptoms of depression.  Control weight gain during  pregnancy.  Reduce the risk of needing insulin if you develop diabetes during pregnancy.  Decrease the risk of cesarean delivery.  Speed up your recovery after giving birth. How does this affect my baby? Exercise can help you have a healthy pregnancy. Exercise does not cause premature birth. It will not cause your baby to weigh less at birth. What exercises can I do? Many exercises are safe for you to do during pregnancy. Do a variety of exercises that safely increase your heart and breathing rates and help you build and maintain muscle strength. Do exercises exactly as told by your health care provider. You may do these exercises:  Walking or hiking.  Swimming.  Water aerobics.  Riding a stationary bike.  Strength training.  Modified yoga or Pilates. Tell your instructor that you are pregnant. Avoid overstretching, and avoid lying on your back for long periods of time.  Running or jogging. Only choose this type of exercise if you: ? Ran or jogged regularly before your pregnancy. ? Can run or jog and still talk in complete sentences. What exercises should I avoid? Depending on your level of fitness and whether you exercised regularly before your pregnancy, you may be told to limit high-intensity exercise. You can tell that you are exercising at a high intensity if you are breathing much harder and faster and  cannot hold a conversation while exercising. You must avoid:  Contact sports.  Activities that put you at risk for falling on or being hit in the belly, such as downhill skiing, water skiing, surfing, rock climbing, cycling, gymnastics, and horseback riding.  Scuba diving.  Skydiving.  Yoga or Pilates in a room that is heated to high temperatures.  Jogging or running, unless you ran or jogged regularly before your pregnancy. While jogging or running, you should always be able to talk in full sentences. Do not run or jog so fast that you are unable to have a  conversation.  Do not exercise at more than 6,000 feet above sea level (high elevation) if you are not used to exercising at high elevation. How do I exercise in a safe way?   Avoid overheating. Do not exercise in very high temperatures.  Wear loose-fitting, breathable clothes.  Avoid dehydration. Drink enough water before, during, and after exercise to keep your urine pale yellow.  Avoid overstretching. Because of hormone changes during pregnancy, it is easy to overstretch muscles, tendons, and ligaments during pregnancy.  Start slowly and ask your health care provider to recommend the types of exercise that are safe for you.  Do not exercise to lose weight. Follow these instructions at home:  Exercise on most days or all days of the week. Try to exercise for 30 minutes a day, 5 days a week, unless your health care provider tells you not to.  If you actively exercised before your pregnancy and you are healthy, your health care provider may tell you to continue to do moderate to high-intensity exercise.  If you are just starting to exercise or did not exercise much before your pregnancy, your health care provider may tell you to do low to moderate-intensity exercise. Questions to ask your health care provider  Is exercise safe for me?  What are signs that I should stop exercising?  Does my health condition mean that I should not exercise during pregnancy?  When should I avoid exercising during pregnancy? Stop exercising and contact a health care provider if: You have any unusual symptoms, such as:  Mild contractions of the uterus or cramps in the abdomen.  Dizziness that does not go away when you rest. Stop exercising and get help right away if: You have any unusual symptoms, such as:  Sudden, severe pain in your low back or your belly.  Mild contractions of the uterus or cramps in the abdomen that do not improve with rest and drinking fluids.  Chest pain.  Bleeding or  fluid leaking from your vagina.  Shortness of breath. These symptoms may represent a serious problem that is an emergency. Do not wait to see if the symptoms will go away. Get medical help right away. Call your local emergency services (911 in the [place] Do not drive yourself to the hospital. Summary  Most women should exercise regularly throughout pregnancy. In rare cases, women with certain medical conditions or complications may be asked to limit or avoid exercise during pregnancy.  Do not exercise to lose weight during pregnancy.  Your health care provider will tell you what level of physical activity is right for you.  Stop exercising and contact a health care provider if you have mild contractions of the uterus or cramps in the abdomen. Get help right away if these contractions or cramps do not improve with rest and drinking fluids.  Stop exercising and get help right away if you have sudden, severe  pain in your low back or belly, chest pain, shortness of breath, or bleeding or leaking of fluid from your vagina. This information is not intended to replace advice given to you by your health care provider. Make sure you discuss any questions you have with your health care provider. Document Revised: 01/07/2019 Document Reviewed: 10/21/2018 Elsevier Patient Education  2020 [employer]   Common Medications Safe in Pregnancy  Acne:      Constipation:  Benzoyl Peroxide     Colace  Clindamycin      Dulcolax Suppository  Topica Erythromycin     Fibercon  Salicylic Acid      Metamucil         Miralax AVOID:        Senakot   Accutane    Cough:  Retin-A       Cough Drops  Tetracycline      Phenergan w/ Codeine if Rx  Minocycline      Robitussin (Plain & DM)  Antibiotics:     Crabs/Lice:  Ceclor       RID  Cephalosporins    AVOID:  E-Mycins      Kwell  Keflex  Macrobid/Macrodantin   Diarrhea:  Penicillin      Kao-Pectate  Zithromax      Imodium AD         PUSH  FLUIDS AVOID:       Cipro     Fever:  Tetracycline      Tylenol (Regular or Extra  Minocycline       Strength)  Levaquin      Extra Strength-Do not          Exceed 8 tabs/24 hrs Caffeine:        <200mg/day (equiv. To 1 cup of coffee or  approx. 3 12 oz sodas)         Gas: Cold/Hayfever:       Gas-X  Benadryl      Mylicon  Claritin       Phazyme  **Claritin-D        Chlor-Trimeton    Headaches:  Dimetapp      ASA-Free Excedrin  Drixoral-Non-Drowsy     Cold Compress  Mucinex (Guaifenasin)     Tylenol (Regular or Extra  Sudafed/Sudafed-12 Hour     Strength)  **Sudafed PE Pseudoephedrine   Tylenol Cold & Sinus     Vicks Vapor Rub  Zyrtec  **AVOID if Problems With Blood Pressure         Heartburn: Avoid lying down for at least 1 hour after meals  Aciphex      Maalox     Rash:  Milk of Magnesia     Benadryl    Mylanta       1% Hydrocortisone Cream  Pepcid  Pepcid Complete   Sleep Aids:  Prevacid      Ambien   Prilosec       Benadryl  Rolaids       Chamomile Tea  Tums (Limit 4/day)     Unisom         Tylenol PM         Warm milk-add vanilla or  Hemorrhoids:       Sugar for taste  Anusol/Anusol H.C.  (RX: Analapram 2.5%)  Sugar Substitutes:  Hydrocortisone OTC     Ok in moderation  Preparation H      Tucks        Vaseline lotion applied to tissue with wiping      wiping    Herpes:     Throat:  Acyclovir      Oragel  Famvir  Valtrex     Vaccines:         Flu Shot Leg Cramps:       *Gardasil  Benadryl      Hepatitis A         Hepatitis B Nasal Spray:       Pneumovax  Saline Nasal Spray     Polio Booster         Tetanus Nausea:       Tuberculosis test or PPD  Vitamin B6 25 mg TID   AVOID:    Dramamine      *Gardasil  Emetrol       Live Poliovirus  Ginger Root 250 mg QID    MMR (measles, mumps &  High Complex Carbs @ Bedtime    rebella)  Sea Bands-Accupressure    Varicella (Chickenpox)  Unisom 1/2 tab TID     *No known complications           If received  before Pain:         Known pregnancy;   Darvocet       Resume series after  Lortab        Delivery  Percocet    Yeast:   Tramadol      Femstat  Tylenol 3      Gyne-lotrimin  Ultram       Monistat  Vicodin           MISC:         All Sunscreens           Hair Coloring/highlights          Insect Repellant's          (Including DEET)         Mystic Tans    WHAT OB PATIENTS CAN EXPECT   Confirmation of pregnancy and ultrasound ordered if medically indicated-[redacted] weeks gestation  New OB (NOB) intake with nurse and New OB (NOB) labs- [redacted] weeks gestation  New OB (NOB) physical examination with provider- 11/[redacted] weeks gestation  Flu vaccine-[redacted] weeks gestation  Anatomy scan-[redacted] weeks gestation  Glucose tolerance test, blood work to test for anemia, T-dap vaccine-[redacted] weeks gestation  Vaginal swabs/cultures-STD/Group B strep-[redacted] weeks gestation  Appointments every 4 weeks until 28 weeks  Every 2 weeks from 28 weeks until 36 weeks  Weekly visits from 36 weeks until delivery     Second Trimester of Pregnancy  The second trimester is from week 14 through week 27 (month 4 through 6). This is often the time in pregnancy that you feel your best. Often times, morning sickness has lessened or quit. You may have more energy, and you may get hungry more often. Your unborn baby is growing rapidly. At the end of the sixth month, he or she is about 9 inches long and weighs about 1 pounds. You will likely feel the baby move between 18 and 20 weeks of pregnancy. Follow these instructions at home: Medicines  Take over-the-counter and prescription medicines only as told by your doctor. Some medicines are safe and some medicines are not safe during pregnancy.  Take a prenatal vitamin that contains at least 600 micrograms (mcg) of folic acid.  If you have trouble pooping (constipation), take medicine that will make your stool soft (stool softener) if your doctor approves. Eating and drinking   Eat  regular, healthy meals.  Avoid   raw meat and uncooked cheese.  If you get low calcium from the food you eat, talk to your doctor about taking a daily calcium supplement.  Avoid foods that are high in fat and sugars, such as fried and sweet foods.  If you feel sick to your stomach (nauseous) or throw up (vomit): ? Eat 4 or 5 small meals a day instead of 3 large meals. ? Try eating a few soda crackers. ? Drink liquids between meals instead of during meals.  To prevent constipation: ? Eat foods that are high in fiber, like fresh fruits and vegetables, whole grains, and beans. ? Drink enough fluids to keep your pee (urine) clear or pale yellow. Activity  Exercise only as told by your doctor. Stop exercising if you start to have cramps.  Do not exercise if it is too hot, too humid, or if you are in a place of great height (high altitude).  Avoid heavy lifting.  Wear low-heeled shoes. Sit and stand up straight.  You can continue to have sex unless your doctor tells you not to. Relieving pain and discomfort  Wear a good support bra if your breasts are tender.  Take warm water baths (sitz baths) to soothe pain or discomfort caused by hemorrhoids. Use hemorrhoid cream if your doctor approves.  Rest with your legs raised if you have leg cramps or low back pain.  If you develop puffy, bulging veins (varicose veins) in your legs: ? Wear support hose or compression stockings as told by your doctor. ? Raise (elevate) your feet for 15 minutes, 3-4 times a day. ? Limit salt in your food. Prenatal care  Write down your questions. Take them to your prenatal visits.  Keep all your prenatal visits as told by your doctor. This is important. Safety  Wear your seat belt when driving.  Make a list of emergency phone numbers, including numbers for family, friends, the hospital, and police and fire departments. General instructions  Ask your doctor about the right foods to eat or for help  finding a counselor, if you need these services.  Ask your doctor about local prenatal classes. Begin classes before month 6 of your pregnancy.  Do not use hot tubs, steam rooms, or saunas.  Do not douche or use tampons or scented sanitary pads.  Do not cross your legs for long periods of time.  Visit your dentist if you have not done so. Use a soft toothbrush to brush your teeth. Floss gently.  Avoid all smoking, herbs, and alcohol. Avoid drugs that are not approved by your doctor.  Do not use any products that contain nicotine or tobacco, such as cigarettes and e-cigarettes. If you need help quitting, ask your doctor.  Avoid cat litter boxes and soil used by cats. These carry germs that can cause birth defects in the baby and can cause a loss of your baby (miscarriage) or stillbirth. Contact a doctor if:  You have mild cramps or pressure in your lower belly.  You have pain when you pee (urinate).  You have bad smelling fluid coming from your vagina.  You continue to feel sick to your stomach (nauseous), throw up (vomit), or have watery poop (diarrhea).  You have a nagging pain in your belly area.  You feel dizzy. Get help right away if:  You have a fever.  You are leaking fluid from your vagina.  You have spotting or bleeding from your vagina.  You have severe belly cramping or pain.    You lose or gain weight rapidly.  You have trouble catching your breath and have chest pain.  You notice sudden or extreme puffiness (swelling) of your face, hands, ankles, feet, or legs.  You have not felt the baby move in over an hour.  You have severe headaches that do not go away when you take medicine.  You have trouble seeing. Summary  The second trimester is from week 14 through week 27 (months 4 through 6). This is often the time in pregnancy that you feel your best.  To take care of yourself and your unborn baby, you will need to eat healthy meals, take medicines only if  your doctor tells you to do so, and do activities that are safe for you and your baby.  Call your doctor if you get sick or if you notice anything unusual about your pregnancy. Also, call your doctor if you need help with the right food to eat, or if you want to know what activities are safe for you. This information is not intended to replace advice given to you by your health care provider. Make sure you discuss any questions you have with your health care provider. Document Revised: 01/08/2019 Document Reviewed: 10/22/2016 Elsevier Patient Education  2020 [employer]  

## 2020-05-27 LAB — COMPREHENSIVE METABOLIC PANEL
ALT: 21 IU/L (ref 0–32)
AST: 16 IU/L (ref 0–40)
Albumin/Globulin Ratio: 1.3 (ref 1.2–2.2)
Albumin: 3.9 g/dL (ref 3.9–5.0)
Alkaline Phosphatase: 101 IU/L (ref 48–121)
BUN/Creatinine Ratio: 10 (ref 9–23)
BUN: 7 mg/dL (ref 6–20)
Bilirubin Total: 0.6 mg/dL (ref 0.0–1.2)
CO2: 21 mmol/L (ref 20–29)
Calcium: 9.1 mg/dL (ref 8.7–10.2)
Chloride: 103 mmol/L (ref 96–106)
Creatinine, Ser: 0.69 mg/dL (ref 0.57–1.00)
GFR calc Af Amer: 142 mL/min/{1.73_m2} (ref >59)
GFR calc non Af Amer: 123 mL/min/{1.73_m2} (ref >59)
Globulin, Total: 2.9 g/dL (ref 1.5–4.5)
Glucose: 75 mg/dL (ref 65–99)
Potassium: 3.8 mmol/L (ref 3.5–5.2)
Sodium: 136 mmol/L (ref 134–144)
Total Protein: 6.8 g/dL (ref 6.0–8.5)

## 2020-05-27 LAB — CBC
Hematocrit: 37.7 % (ref 34.0–46.6)
Hemoglobin: 12.5 g/dL (ref 11.1–15.9)
MCH: 32.6 pg (ref 26.6–33.0)
MCHC: 33.2 g/dL (ref 31.5–35.7)
MCV: 98 fL — ABNORMAL HIGH (ref 79–97)
Platelets: 293 10*3/uL (ref 150–450)
RBC: 3.83 x10E6/uL (ref 3.77–5.28)
RDW: 12.1 % (ref 11.7–15.4)
WBC: 9.7 10*3/uL (ref 3.4–10.8)

## 2020-05-27 LAB — URIC ACID: Uric Acid: 4 mg/dL (ref 2.6–6.2)

## 2020-05-27 LAB — PROTEIN / CREATININE RATIO, URINE
Creatinine, Urine: 127.2 mg/dL
Protein, Ur: 12.3 mg/dL
Protein/Creat Ratio: 97 mg/g creat (ref 0–200)

## 2020-05-27 MED ORDER — ONDANSETRON 4 MG PO TBDP
4.0000 mg | ORAL_TABLET | Freq: Four times a day (QID) | ORAL | 0 refills | Status: DC | PRN
Start: 2020-05-27 — End: 2020-06-21

## 2020-05-27 NOTE — Progress Notes (Signed)
NEW OB HISTORY AND PHYSICAL  SUBJECTIVE:       Brenda Osborn is a 24 y.o. G63P0000 female, Patient's last menstrual period was 02/28/2020 (approximate)., Estimated Date of Delivery: 12/04/20, [redacted]w[redacted]d, presents today for establishment of Prenatal Care.  Endorses nausea with vomiting, intermittent cramping and headaches.   Denies difficulty breathing or respiratory distress, chest pain, dysuria, and leg pain or swelling.   Desires genetic screening.   History significant for chronic hypertension on metoprolol, taking an 81 mg aspirin as well.    Gynecologic History  Patient's last menstrual period was 02/28/2020 (approximate).  Contraception: none  Last Pap: 02/2018. Results were: normal  Obstetric History  OB History  Gravida Para Term Preterm AB Living  1 0 0 0 0 0  SAB TAB Ectopic Multiple Live Births  0 0 0 0 0    # Outcome Date GA Lbr Len/2nd Weight Sex Delivery Anes PTL Lv  1 Current             Past Medical History:  Diagnosis Date   Hypertension     Past Surgical History:  Procedure Laterality Date   NO PAST SURGERIES      Current Outpatient Medications on File Prior to Visit  Medication Sig Dispense Refill   aspirin EC 81 MG tablet Take 1 tablet (81 mg total) by mouth daily. 300 tablet 2   Doxylamine Succinate, Sleep, (UNISOM PO) Take by mouth.     metoprolol succinate (TOPROL-XL) 50 MG 24 hr tablet Take by mouth. Take 1 1/2 tablet once daily.     Prenatal Vit-Fe Fumarate-FA (PRENATAL MULTIVITAMIN) TABS tablet Take 1 tablet by mouth daily at 12 noon.     pyridOXINE (VITAMIN B-6) 25 MG tablet Take 25 mg by mouth daily.     No current facility-administered medications on file prior to visit.    Allergies  Allergen Reactions   Zithromax [Azithromycin]     Respiratory Destress    Social History   Socioeconomic History   Marital status: Married    Spouse name: Not on file   Number of children: Not on file   Years of education: Not on  file   Highest education level: Not on file  Occupational History   Not on file  Tobacco Use   Smoking status: Never Smoker   Smokeless tobacco: Never Used  Vaping Use   Vaping Use: Never used  Substance and Sexual Activity   Alcohol use: No   Drug use: No   Sexual activity: Not Currently    Partners: Male  Other Topics Concern   Not on file  Social History Narrative   Not on file   Social Determinants of Health   Financial Resource Strain:    Difficulty of Paying Living Expenses: Not on file  Food Insecurity:    Worried About [place] in the Last Year: Not on file   Ran Out of Food in the Last Year: Not on file  Transportation Needs:    Lack of Transportation (Medical): Not on file   Lack of Transportation (Non-Medical): Not on file  Physical Activity:    Days of Exercise per Week: Not on file   Minutes of Exercise per Session: Not on file  Stress:    Feeling of Stress : Not on file  Social Connections:    Frequency of Communication with Friends and Family: Not on file   Frequency of Social Gatherings with Friends and Family: Not on file   Attends  Religious Services: Not on file   Active Member of Clubs or Organizations: Not on file   Attends Club or Organization Meetings: Not on file   Marital Status: Not on file  Intimate Partner Violence:    Fear of Current or Ex-Partner: Not on file   Emotionally Abused: Not on file   Physically Abused: Not on file   Sexually Abused: Not on file    Family History  Problem Relation Age of Onset   Diabetes Mother        "Pre Diabetes"   Healthy Sister    Hypertension Maternal Grandmother    Healthy Sister    Breast cancer Neg Hx    Ovarian cancer Neg Hx    Colon cancer Neg Hx     The following portions of the patient's history were reviewed and updated as appropriate: allergies, current medications, past OB history, past medical history, past surgical history, past family  history, past social history, and problem list.  Review of Systems:  ROS negative except as noted above. Information obtained from patient.   OBJECTIVE:  BP 120/90    Pulse 83    Wt 243 lb 8 oz (110.5 kg)    LMP 02/28/2020 (Approximate)    BMI 37.02 kg/m   Initial Physical Exam (New OB)  GENERAL APPEARANCE: alert, well appearing, in no apparent distress  HEAD: normocephalic, atraumatic  MOUTH: mucous membranes moist, pharynx normal without lesions  THYROID: no thyromegaly or masses present  BREASTS: no masses noted, no significant tenderness, no palpable axillary nodes, no skin changes  LUNGS: clear to auscultation, no wheezes, rales or rhonchi, symmetric air entry  HEART: regular rate and rhythm, no murmurs  ABDOMEN: soft, nontender, nondistended, no abnormal masses, no epigastric pain, obese and FHT present on bedside ultrasound  EXTREMITIES: no redness or tenderness in the calves or thighs, no edema  SKIN: normal coloration and turgor, no rashes  LYMPH NODES: no adenopathy palpable  NEUROLOGIC: alert, oriented, normal speech, no focal findings or movement disorder noted  PELVIC EXAM: patient declined exam  ASSESSMENT: Normal pregnancy Rh positive  Obesity in pregnancy Chronic hypertension on metoprolol Desires genetic screening-[place] ordered  PLAN: Prenatal care New OB counseling: The patient has been given an overview regarding routine prenatal care. Recommendations regarding diet, weight gain, and exercise in pregnancy were given. Prenatal testing, optional genetic testing, and ultrasound use in pregnancy were reviewed.  Benefits of Breast Feeding were discussed. The patient is encouraged to consider nursing her baby post partum. See orders

## 2020-06-08 ENCOUNTER — Telehealth: Payer: Self-pay

## 2020-06-08 NOTE — Telephone Encounter (Signed)
mychart message sent

## 2020-06-15 ENCOUNTER — Ambulatory Visit
Admission: RE | Admit: 2020-06-15 | Discharge: 2020-06-15 | Disposition: A | Discharge status: Home / Self Care | Payer: 59 | Source: Ambulatory Visit | Attending: Family Medicine | Admitting: Family Medicine

## 2020-06-15 ENCOUNTER — Other Ambulatory Visit: Payer: Self-pay

## 2020-06-15 ENCOUNTER — Other Ambulatory Visit: Payer: Self-pay | Admitting: Family Medicine

## 2020-06-15 ENCOUNTER — Other Ambulatory Visit (HOSPITAL_COMMUNITY): Payer: Self-pay | Admitting: Family Medicine

## 2020-06-15 DIAGNOSIS — M79662 Pain in left lower leg: Secondary | ICD-10-CM

## 2020-06-21 ENCOUNTER — Ambulatory Visit (INDEPENDENT_AMBULATORY_CARE_PROVIDER_SITE_OTHER): Payer: 59 | Admitting: Certified Nurse Midwife

## 2020-06-21 ENCOUNTER — Encounter: Payer: Self-pay | Admitting: Certified Nurse Midwife

## 2020-06-21 ENCOUNTER — Other Ambulatory Visit: Payer: Self-pay

## 2020-06-21 VITALS — BP 104/69 | HR 98 | Wt 244.6 lb

## 2020-06-21 DIAGNOSIS — Z3402 Encounter for supervision of normal first pregnancy, second trimester: Secondary | ICD-10-CM

## 2020-06-21 LAB — POCT URINALYSIS DIPSTICK OB
Bilirubin, UA: NEGATIVE
Blood, UA: NEGATIVE
Glucose, UA: NEGATIVE
Ketones, UA: NEGATIVE
Leukocytes, UA: NEGATIVE
Nitrite, UA: NEGATIVE
POC,PROTEIN,UA: NEGATIVE
Spec Grav, UA: 1.02 (ref 1.010–1.025)
Urobilinogen, UA: 0.2 E.U./dL
pH, UA: 5 (ref 5.0–8.0)

## 2020-06-21 NOTE — Patient Instructions (Signed)

## 2020-06-21 NOTE — Progress Notes (Signed)
ROB doing well. Complains of headaches and leg cramps. Reassurance given . Discussed self help measures. She verbalizes understanding. Baseline labs today . Discussed antomay scan next visit. She verbalizes and agrees. Follow up 4 wks with Sharyn Lull.   Philip Aspen, CNM

## 2020-06-22 ENCOUNTER — Telehealth: Payer: Self-pay | Admitting: Certified Nurse Midwife

## 2020-06-22 ENCOUNTER — Telehealth: Payer: Self-pay

## 2020-06-22 LAB — COMPREHENSIVE METABOLIC PANEL
ALT: 46 IU/L — ABNORMAL HIGH (ref 0–32)
AST: 35 IU/L (ref 0–40)
Albumin/Globulin Ratio: 1.7 (ref 1.2–2.2)
Albumin: 3.8 g/dL — ABNORMAL LOW (ref 3.9–5.0)
Alkaline Phosphatase: 102 IU/L (ref 44–121)
BUN/Creatinine Ratio: 10 (ref 9–23)
BUN: 6 mg/dL (ref 6–20)
Bilirubin Total: 0.4 mg/dL (ref 0.0–1.2)
CO2: 19 mmol/L — ABNORMAL LOW (ref 20–29)
Calcium: 9.2 mg/dL (ref 8.7–10.2)
Chloride: 105 mmol/L (ref 96–106)
Creatinine, Ser: 0.6 mg/dL (ref 0.57–1.00)
GFR calc Af Amer: 149 mL/min/{1.73_m2} (ref >59)
GFR calc non Af Amer: 129 mL/min/{1.73_m2} (ref >59)
Globulin, Total: 2.3 g/dL (ref 1.5–4.5)
Glucose: 98 mg/dL (ref 65–99)
Potassium: 3.7 mmol/L (ref 3.5–5.2)
Sodium: 137 mmol/L (ref 134–144)
Total Protein: 6.1 g/dL (ref 6.0–8.5)

## 2020-06-22 LAB — CBC
Hematocrit: 34.8 % (ref 34.0–46.6)
Hemoglobin: 11.9 g/dL (ref 11.1–15.9)
MCH: 32.8 pg (ref 26.6–33.0)
MCHC: 34.2 g/dL (ref 31.5–35.7)
MCV: 96 fL (ref 79–97)
Platelets: 298 10*3/uL (ref 150–450)
RBC: 3.63 x10E6/uL — ABNORMAL LOW (ref 3.77–5.28)
RDW: 12.1 % (ref 11.7–15.4)
WBC: 9.3 10*3/uL (ref 3.4–10.8)

## 2020-06-22 LAB — MAGNESIUM: Magnesium: 1.8 mg/dL (ref 1.6–2.3)

## 2020-06-22 LAB — PROTEIN / CREATININE RATIO, URINE
Creatinine, Urine: 192.6 mg/dL
Protein, Ur: 8.4 mg/dL
Protein/Creat Ratio: 44 mg/g creat (ref 0–200)

## 2020-06-22 NOTE — Telephone Encounter (Signed)
Received a telephone message from [place] requesting lab orders and previously drawn lab results to be faxed to Shirlean Mylar at [facility]. A message was sent back to [place] inquiring what specific lab results were needed. She did not respond back. Lab reports from 06/21/20 and 05/26/20 faxed to [phone] once at 1115 which the line was busy. Faxed again at 1207 again line was busy. Refaxed a third time at 1257 at which time a confirmation of receipt was received.

## 2020-06-22 NOTE — Telephone Encounter (Signed)
The pt called in and stated that she needs her labs sent over to [facility]. The pt said that she needs to make an appointment. I told the pt that I see where they called in and requested the orders ti be faxed to them. The pt verbally understood.

## 2020-06-22 NOTE — Telephone Encounter (Signed)
Robin from [facility] called in. She is requesting this pts order for her labs sent over, she is trying to make an appointment for this pt. She also needs the pts last lab results as well. The fax number is [phone] . Please advise

## 2020-06-23 NOTE — Telephone Encounter (Signed)
LM for Robyn at [facility] that Ivin Booty had faxed lab results yesterday and that all labs are in [place] to review.

## 2020-06-23 NOTE — Telephone Encounter (Signed)
No she didn't she said she just needed the pts lab orders faxed over.

## 2020-06-29 ENCOUNTER — Telehealth: Payer: Self-pay | Admitting: Obstetrics & Gynecology

## 2020-06-29 NOTE — Telephone Encounter (Signed)
[facility] referring [redacted] weeks pregnant and wishes to change care with Current OB-GYN. Called and left voicemail for patient to call back to be scheduled.

## 2020-07-20 ENCOUNTER — Other Ambulatory Visit: Payer: 59

## 2020-07-20 ENCOUNTER — Encounter: Payer: 59 | Admitting: Certified Nurse Midwife

## 2020-07-20 DIAGNOSIS — Z3A20 20 weeks gestation of pregnancy: Secondary | ICD-10-CM

## 2020-07-20 DIAGNOSIS — Z3402 Encounter for supervision of normal first pregnancy, second trimester: Secondary | ICD-10-CM

## 2020-07-24 ENCOUNTER — Ambulatory Visit (INDEPENDENT_AMBULATORY_CARE_PROVIDER_SITE_OTHER): Payer: Managed Care, Other (non HMO) | Admitting: Obstetrics & Gynecology

## 2020-07-24 ENCOUNTER — Other Ambulatory Visit: Payer: Self-pay

## 2020-07-24 ENCOUNTER — Encounter: Payer: Self-pay | Admitting: Obstetrics & Gynecology

## 2020-07-24 VITALS — BP 130/78 | Wt 247.0 lb

## 2020-07-24 DIAGNOSIS — O0993 Supervision of high risk pregnancy, unspecified, third trimester: Secondary | ICD-10-CM | POA: Insufficient documentation

## 2020-07-24 DIAGNOSIS — O10919 Unspecified pre-existing hypertension complicating pregnancy, unspecified trimester: Secondary | ICD-10-CM

## 2020-07-24 DIAGNOSIS — O0992 Supervision of high risk pregnancy, unspecified, second trimester: Secondary | ICD-10-CM

## 2020-07-24 DIAGNOSIS — Z3A21 21 weeks gestation of pregnancy: Secondary | ICD-10-CM

## 2020-07-24 DIAGNOSIS — Z3402 Encounter for supervision of normal first pregnancy, second trimester: Secondary | ICD-10-CM | POA: Insufficient documentation

## 2020-07-24 LAB — POCT URINALYSIS DIPSTICK OB
Glucose, UA: NEGATIVE
POC,PROTEIN,UA: NEGATIVE

## 2020-07-24 MED ORDER — DOXYLAMINE-PYRIDOXINE 10-10 MG PO TBEC: DELAYED_RELEASE_TABLET | ORAL | 2 refills | Status: DC

## 2020-07-24 MED ORDER — NIFEDIPINE ER OSMOTIC RELEASE 30 MG PO TB24: 30.0000 mg | ORAL_TABLET | Freq: Every day | ORAL | 2 refills | Status: DC

## 2020-07-24 NOTE — Progress Notes (Signed)
07/24/2020   Chief Complaint: chronic hypertension and pregnancy  Transfer of Care Patient: Pt requests transfer of care from [facility]a [facility] for personal reasons  History of Present Illness: Brenda Osborn is a 24 y.o. G1P0000 [redacted]w[redacted]d based on Patient's last menstrual period was 02/28/2020 (approximate). with an Estimated Date of Delivery: 12/04/20, with the above CC.   She has cHTN, on 2 meds prior to pregnancy, and adjusted to Metroprolol only at this time.  She has headaches occasionally.  She checks BP at home, sometimes >140s.90s.  She has edema and lower extremity pain at times.  She has had baseline labs (reviewed).  She is on baby ASA daily.   She also continues to have almost daily nausea w occas vomiting.  Prior labs reviewed.  NIPT nml XX.  Due for anatomy scan.  ROS: A 12-point review of systems was performed and negative, except as stated in the above HPI.  OBGYN History: As per HPI. OB History  Gravida Para Term Preterm AB Living  2 0 0 0 1 0  SAB TAB Ectopic Multiple Live Births  1 0 0 0 0    # Outcome Date GA Lbr Len/2nd Weight Sex Delivery Anes PTL Lv  2 Current           1 SAB 08/2019             Past Medical History: Past Medical History:  Diagnosis Date  . Hypertension     Past Surgical History: Past Surgical History:  Procedure Laterality Date  . NO PAST SURGERIES      Family History:  Family History  Problem Relation Age of Onset  . Diabetes Mother        "Pre Diabetes"  . Healthy Sister   . Hypertension Maternal Grandmother   . Healthy Sister   . Breast cancer Neg Hx   . Ovarian cancer Neg Hx   . Colon cancer Neg Hx    Social History:  Social History   Socioeconomic History  . Marital status: Married    Spouse name: Not on file  . Number of children: Not on file  . Years of education: Not on file  . Highest education level: Not on file  Occupational History  . Not on file  Tobacco Use  . Smoking status: Never Smoker    . Smokeless tobacco: Never Used  Vaping Use  . Vaping Use: Never used  Substance and Sexual Activity  . Alcohol use: No  . Drug use: No  . Sexual activity: Not Currently    Partners: Male  Other Topics Concern  . Not on file  Social History Narrative  . Not on file   Social Determinants of Health   Financial Resource Strain:   . Difficulty of Paying Living Expenses: Not on file  Food Insecurity:   . Worried About [place] fund[place] in the Last Year: Not on file  . Ran Out of Food in the Last Year: Not on file  Transportation Needs:   . Lack of Transportation (Medical): Not on file  . Lack of Transportation (Non-Medical): Not on file  Physical Activity:   . Days of Exercise per Week: Not on file  . Minutes of Exercise per Session: Not on file  Stress:   . Feeling of Stress : Not on file  Social Connections:   . Frequency of Communication with Friends and Family: Not on file  . Frequency of Social Gatherings with Friends and  Family: Not on file  . Attends Religious Services: Not on file  . Active Member of Clubs or Organizations: Not on file  . Attends Archivist Meetings: Not on file  . Marital Status: Not on file  Intimate Partner Violence:   . Fear of Current or Ex-Partner: Not on file  . Emotionally Abused: Not on file  . Physically Abused: Not on file  . Sexually Abused: Not on file   Allergy: Allergies  Allergen Reactions  . Zithromax [Azithromycin]     Respiratory Destress    Current Outpatient Medications:  Current Outpatient Medications:  .  aspirin EC 81 MG tablet, Take 1 tablet (81 mg total) by mouth daily., Disp: 300 tablet, Rfl: 2 .  metoprolol succinate (TOPROL-XL) 50 MG 24 hr tablet, Take by mouth 2 (two) times daily. Take 1 tab BID, Disp: , Rfl:  .  Prenatal Vit-Fe Fumarate-FA (PRENATAL MULTIVITAMIN) TABS tablet, Take 1 tablet by mouth daily at 12 noon., Disp: , Rfl:  .  pyridOXINE (VITAMIN B-6) 25 MG tablet, Take 25 mg by mouth daily.,  Disp: , Rfl:  .  Doxylamine-Pyridoxine 10-10 MG TBEC, Take 2 tabs qhs prn nausea.  Also can take One tab po q am and 2 tabs po qhs prn nausea., Disp: 60 tablet, Rfl: 2 .  NIFEdipine (PROCARDIA-XL/NIFEDICAL-XL) 30 MG 24 hr tablet, Take 1 tablet (30 mg total) by mouth daily. Can increase to twice a day as needed for symptomatic contractions, Disp: 30 tablet, Rfl: 2   Physical Exam:   BP 130/78   Wt 247 lb (112 kg)   LMP 02/28/2020 (Approximate)   BMI 37.56 kg/m  Body mass index is 37.56 kg/m. Constitutional: Well nourished, well developed female in no acute distress.  Neck:  Supple, normal appearance, and no thyromegaly  Cardiovascular: S1, S2 normal, no murmur, rub or gallop, regular rate and rhythm Respiratory:  Clear to auscultation bilateral. Normal respiratory effort Abdomen: positive bowel sounds and no masses, hernias; diffusely non tender to palpation, non distended FHT 140s Neuro/Psych:  Normal mood and affect.  Skin:  Warm and dry.  Lymphatic:  No inguinal lymphadenopathy.   Assessment: [name] is a 24 y.o. G1P0000 [redacted]w[redacted]d based on Patient's last menstrual period was 02/28/2020 (approximate). with an Estimated Date of Delivery: 12/04/20,  for prenatal care.  Plan:  1) Avoid alcoholic beverages. 2) Patient encouraged not to smoke.  3) Discontinue the use of all non-medicinal drugs and chemicals.  4) Take prenatal vitamins daily.  5) Seatbelt use advised 6) Nutrition, food safety (fish, cheese advisories, and high nitrite foods) and exercise discussed. 7) Hospital and practice style delivering at [facility] discussed, as well as [facility] providers and care  8) Patient is asked about travel to areas at risk for the [place] virus, and counseled to avoid travel and exposure to mosquitoes or sexual partners who may have themselves been exposed to the virus. Testing is discussed, and will be ordered as appropriate.  9) Childbirth classes at [facility] advised 10) Plan to add Procardia 30 mg  daily for cHTN and preparation for rest of pregnancy care for HTN.  Risks for preeclampsia discussed.  Labs UTD.  ASA daily.  APT and growth [place] third trimester.  IOL possible at 37 -39 weeks. 11) Diclegis for nausea 12) Anat [place] soon  Problem list reviewed and updated.  Brenda Applebaum, MD, [facility], [facility] 07/24/2020  8:54 AM

## 2020-07-24 NOTE — Patient Instructions (Signed)
Thank you for choosing [facility]. As part of our ongoing efforts to improve patient experience, we would appreciate your feedback. Please fill out the short survey that you will receive by mail or MyChart. Your opinion is important to [place]! -Dr Kenton Kingfisher  Second Trimester of Pregnancy The second trimester is from week 14 through week 27 (months 4 through 6). The second trimester is often a time when you feel your best. Your body has adjusted to being pregnant, and you begin to feel better physically. Usually, morning sickness has lessened or quit completely, you may have more energy, and you may have an increase in appetite. The second trimester is also a time when the fetus is growing rapidly. At the end of the sixth month, the fetus is about 9 inches long and weighs about 1 pounds. You will likely begin to feel the baby move (quickening) between 16 and 20 weeks of pregnancy. Body changes during your second trimester Your body continues to go through many changes during your second trimester. The changes vary from woman to woman.  Your weight will continue to increase. You will notice your lower abdomen bulging out.  You may begin to get stretch marks on your hips, abdomen, and breasts.  You may develop headaches that can be relieved by medicines. The medicines should be approved by your health care provider.  You may urinate more often because the fetus is pressing on your bladder.  You may develop or continue to have heartburn as a result of your pregnancy.  You may develop constipation because certain hormones are causing the muscles that push waste through your intestines to slow down.  You may develop hemorrhoids or swollen, bulging veins (varicose veins).  You may have back pain. This is caused by: ? Weight gain. ? Pregnancy hormones that are relaxing the joints in your pelvis. ? A shift in weight and the muscles that support your balance.  Your breasts will continue to grow and  they will continue to become tender.  Your gums may bleed and may be sensitive to brushing and flossing.  Dark spots or blotches (chloasma, mask of pregnancy) may develop on your face. This will likely fade after the baby is born.  A dark line from your belly button to the pubic area (linea nigra) may appear. This will likely fade after the baby is born.  You may have changes in your hair. These can include thickening of your hair, rapid growth, and changes in texture. Some women also have hair loss during or after pregnancy, or hair that feels dry or thin. Your hair will most likely return to normal after your baby is born. What to expect at prenatal visits During a routine prenatal visit:  You will be weighed to make sure you and the fetus are growing normally.  Your blood pressure will be taken.  Your abdomen will be measured to track your baby's growth.  The fetal heartbeat will be listened to.  Any test results from the previous visit will be discussed. Your health care provider may ask you:  How you are feeling.  If you are feeling the baby move.  If you have had any abnormal symptoms, such as leaking fluid, bleeding, severe headaches, or abdominal cramping.  If you are using any tobacco products, including cigarettes, chewing tobacco, and electronic cigarettes.  If you have any questions. Other tests that may be performed during your second trimester include:  Blood tests that check for: ? Low iron levels (  anemia). ? High blood sugar that affects pregnant women (gestational diabetes) between 108 and 28 weeks. ? Rh antibodies. This is to check for a protein on red blood cells (Rh factor).  Urine tests to check for infections, diabetes, or protein in the urine.  An ultrasound to confirm the proper growth and development of the baby.  An amniocentesis to check for possible genetic problems.  Fetal screens for spina bifida and Down syndrome.  HIV (human  immunodeficiency virus) testing. Routine prenatal testing includes screening for HIV, unless you choose not to have this test. Follow these instructions at home: Medicines  Follow your health care provider's instructions regarding medicine use. Specific medicines may be either safe or unsafe to take during pregnancy.  Take a prenatal vitamin that contains at least 600 micrograms (mcg) of folic acid.  If you develop constipation, try taking a stool softener if your health care provider approves. Eating and drinking   Eat a balanced diet that includes fresh fruits and vegetables, whole grains, good sources of protein such as meat, eggs, or tofu, and low-fat dairy. Your health care provider will help you determine the amount of weight gain that is right for you.  Avoid raw meat and uncooked cheese. These carry germs that can cause birth defects in the baby.  If you have low calcium intake from food, talk to your health care provider about whether you should take a daily calcium supplement.  Limit foods that are high in fat and processed sugars, such as fried and sweet foods.  To prevent constipation: ? Drink enough fluid to keep your urine clear or pale yellow. ? Eat foods that are high in fiber, such as fresh fruits and vegetables, whole grains, and beans. Activity  Exercise only as directed by your health care provider. Most women can continue their usual exercise routine during pregnancy. Try to exercise for 30 minutes at least 5 days a week. Stop exercising if you experience uterine contractions.  Avoid heavy lifting, wear low heel shoes, and practice good posture.  A sexual relationship may be continued unless your health care provider directs you otherwise. Relieving pain and discomfort  Wear a good support bra to prevent discomfort from breast tenderness.  Take warm sitz baths to soothe any pain or discomfort caused by hemorrhoids. Use hemorrhoid cream if your health care  provider approves.  Rest with your legs elevated if you have leg cramps or low back pain.  If you develop varicose veins, wear support hose. Elevate your feet for 15 minutes, 3-4 times a day. Limit salt in your diet. Prenatal Care  Write down your questions. Take them to your prenatal visits.  Keep all your prenatal visits as told by your health care provider. This is important. Safety  Wear your seat belt at all times when driving.  Make a list of emergency phone numbers, including numbers for family, friends, the hospital, and police and fire departments. General instructions  Ask your health care provider for a referral to a local prenatal education class. Begin classes no later than the beginning of month 6 of your pregnancy.  Ask for help if you have counseling or nutritional needs during pregnancy. Your health care provider can offer advice or refer you to specialists for help with various needs.  Do not use hot tubs, steam rooms, or saunas.  Do not douche or use tampons or scented sanitary pads.  Do not cross your legs for long periods of time.  Avoid cat litter  boxes and soil used by cats. These carry germs that can cause birth defects in the baby and possibly loss of the fetus by miscarriage or stillbirth.  Avoid all smoking, herbs, alcohol, and unprescribed drugs. Chemicals in these products can affect the formation and growth of the baby.  Do not use any products that contain nicotine or tobacco, such as cigarettes and e-cigarettes. If you need help quitting, ask your health care provider.  Visit your dentist if you have not gone yet during your pregnancy. Use a soft toothbrush to brush your teeth and be gentle when you floss. Contact a health care provider if:  You have dizziness.  You have mild pelvic cramps, pelvic pressure, or nagging pain in the abdominal area.  You have persistent nausea, vomiting, or diarrhea.  You have a bad smelling vaginal  discharge.  You have pain when you urinate. Get help right away if:  You have a fever.  You are leaking fluid from your vagina.  You have spotting or bleeding from your vagina.  You have severe abdominal cramping or pain.  You have rapid weight gain or weight loss.  You have shortness of breath with chest pain.  You notice sudden or extreme swelling of your face, hands, ankles, feet, or legs.  You have not felt your baby move in over an hour.  You have severe headaches that do not go away when you take medicine.  You have vision changes. Summary  The second trimester is from week 14 through week 27 (months 4 through 6). It is also a time when the fetus is growing rapidly.  Your body goes through many changes during pregnancy. The changes vary from woman to woman.  Avoid all smoking, herbs, alcohol, and unprescribed drugs. These chemicals affect the formation and growth your baby.  Do not use any tobacco products, such as cigarettes, chewing tobacco, and e-cigarettes. If you need help quitting, ask your health care provider.  Contact your health care provider if you have any questions. Keep all prenatal visits as told by your health care provider. This is important. This information is not intended to replace advice given to you by your health care provider. Make sure you discuss any questions you have with your health care provider. Document Revised: 01/08/2019 Document Reviewed: 10/22/2016 Elsevier Patient Education  [place].

## 2020-07-27 ENCOUNTER — Encounter: Payer: 59 | Admitting: Certified Nurse Midwife

## 2020-07-27 ENCOUNTER — Other Ambulatory Visit: Payer: 59

## 2020-08-03 ENCOUNTER — Encounter: Payer: 59 | Admitting: Certified Nurse Midwife

## 2020-08-03 ENCOUNTER — Other Ambulatory Visit: Payer: 59

## 2020-08-08 ENCOUNTER — Ambulatory Visit (INDEPENDENT_AMBULATORY_CARE_PROVIDER_SITE_OTHER): Payer: Managed Care, Other (non HMO)

## 2020-08-08 ENCOUNTER — Encounter: Payer: Self-pay | Admitting: Obstetrics & Gynecology

## 2020-08-08 ENCOUNTER — Other Ambulatory Visit: Payer: Self-pay

## 2020-08-08 ENCOUNTER — Ambulatory Visit (INDEPENDENT_AMBULATORY_CARE_PROVIDER_SITE_OTHER): Payer: Managed Care, Other (non HMO) | Admitting: Obstetrics & Gynecology

## 2020-08-08 VITALS — BP 120/80 | Wt 247.0 lb

## 2020-08-08 DIAGNOSIS — Z3A23 23 weeks gestation of pregnancy: Secondary | ICD-10-CM

## 2020-08-08 DIAGNOSIS — Q270 Congenital absence and hypoplasia of umbilical artery: Secondary | ICD-10-CM | POA: Insufficient documentation

## 2020-08-08 DIAGNOSIS — Z3402 Encounter for supervision of normal first pregnancy, second trimester: Secondary | ICD-10-CM | POA: Diagnosis not present

## 2020-08-08 DIAGNOSIS — O10919 Unspecified pre-existing hypertension complicating pregnancy, unspecified trimester: Secondary | ICD-10-CM

## 2020-08-08 DIAGNOSIS — O0992 Supervision of high risk pregnancy, unspecified, second trimester: Secondary | ICD-10-CM

## 2020-08-08 DIAGNOSIS — Z3A21 21 weeks gestation of pregnancy: Secondary | ICD-10-CM

## 2020-08-08 LAB — POCT URINALYSIS DIPSTICK OB
Glucose, UA: NEGATIVE
POC,PROTEIN,UA: NEGATIVE

## 2020-08-08 NOTE — Addendum Note (Signed)
Addended by: Quintella Baton D on: 08/08/2020 03:38 PM   Modules accepted: Orders

## 2020-08-08 NOTE — Progress Notes (Signed)
Prenatal Visit Note Date: 08/08/2020 Clinic: [facility]  Subjective:  Brenda Osborn is a 24 y.o. G2P0010 at [redacted]w[redacted]d being seen today for ongoing prenatal care.  She is currently monitored for the following issues for this high-risk pregnancy and has Headache; Hidradenitis suppurativa; Type A blood, Rh positive; Susceptible to varicella (non-immune), currently pregnant; Chronic hypertension affecting pregnancy; High-risk pregnancy, second trimester; and Single umbilical artery on their problem list.  Patient reports no complaints.   Contractions: Not present. Vag. Bleeding: None.  Movement: Present. Denies leaking of fluid.   The following portions of the patient's history were reviewed and updated as appropriate: allergies, current medications, past family history, past medical history, past social history, past surgical history and problem list. Problem list updated.  Objective:   Vitals:   08/08/20 1450  BP: 120/80  Weight: 247 lb (112 kg)    Fetal Status:     Movement: Present     General:  Alert, oriented and cooperative. Patient is in no acute distress.  Skin: Skin is warm and dry. No rash noted.   Cardiovascular: Normal heart rate noted  Respiratory: Normal respiratory effort, no problems with respiration noted  Abdomen: Soft, gravid, appropriate for gestational age. Pain/Pressure: Absent     Pelvic:  Cervical exam deferred        Extremities: Normal range of motion.     Mental Status: Normal mood and affect. Normal behavior. Normal judgment and thought content.   Urinalysis:      Assessment and Plan:  Pregnancy: G2P0010 at [redacted]w[redacted]d  1. [redacted] weeks gestation of pregnancy PNV  2. High-risk pregnancy, second trimester Discussed APT and [place] related to cHTN and SUA  3. Chronic hypertension affecting pregnancy Labs and [place] f.u scheduled for next appt's: - 28 Week RH+Panel; Future - CMP and Liver; Future - US OB Follow Up; Future Cont Metropolol and Procardia  4. Single  umbilical artery   Problem Noted Resolved   Single umbilical artery 12/02/5972 by Gae Dry, MD No   Overview Signed 08/08/2020  3:10 PM by Gae Dry, MD    Growth [place] 30 weeks APT weekly 32 weeks IOL 39 weeks      High-risk pregnancy, second trimester 07/24/2020 by Gae Dry, MD No   Overview Signed 07/24/2020  8:45 AM by Gae Dry, MD    Clinic [facility] Prenatal Labs  Dating LMP, [place] confirmed 12 wks Blood type: A/Positive/-- (08/12 1449)   Genetic Screen      NIPS:nml XX Antibody:Negative (08/12 1449)  Anatomic [place]  Rubella: 1.66 (08/12 1449) Varicella:NI  GTT Early:nml A1C    Third trimester:  RPR: Non Reactive (08/12 1449)   Rhogam n/a HBsAg: Negative (08/12 1449)   TDaP vaccine                       Flu Shot: HIV: Non Reactive (08/12 1449)   Baby Food                                GBS:   Contraception  [id number], needs pp  CBB  No   CS/VBAC N/a   Support Person Husband           Chronic hypertension affecting pregnancy     Overview Signed 07/24/2020  8:46 AM by Gae Dry, MD    [x ] Aspirin 81 mg daily after 12 weeks; discontinue after 36 weeks [x ]  baseline labs with CBC, CMP, urine protein/creatinine ratio [ ]  ultrasound for growth at 28, 32, 36 weeks [x ] Baseline EKG   Current antihypertensives:  Metoprolol     Procardia added 07/24/20   Baseline and surveillance labs (pulled in from [facility], refresh links as needed)  Lab Results  Component Value Date   PLT 298 06/21/2020   CREATININE 0.60 06/21/2020   AST 35 06/21/2020   ALT 46 (H) 06/21/2020    Antenatal Testing CHTN - O10.919  Group I  BP < 140/90, no preeclampsia, AGA,  nml AFV, +/- meds    Group II BP > 140/90, on meds, no preeclampsia, AGA, nml AFV  20-28-34-38  20-24-28-32-35-38  32//2 x wk  28//BPP wkly then 32//2 x wk  40 no meds; 39 meds  PRN or 37  Pre-eclampsia  GHTN - O13.9/Preeclampsia without severe features  - O14.00   Preeclampsia with severe  features - O14.10  Q 3-4wks  Q 2 wks  28//BPP wkly then 32//2 x wk  Inpatient  37  PRN or 34         Susceptible to varicella (non-immune), currently pregnant         Return in about 3 weeks (around 08/29/2020) for HROB w glucola, also HROB 5 weeks w [place].  Barnett Applebaum, MD, [facility], [facility] 08/08/2020  3:21 PM

## 2020-08-08 NOTE — Patient Instructions (Signed)
Fetal Monitoring Overview Monitoring your developing baby (fetus) before birth can identify potential problems for you and your baby. Fetal monitoring can:  Help prevent serious problems from developing.  Guide health care providers in how best to care for your unborn baby.  Check your unborn baby's overall health. The amount of monitoring and the specific tests that will be done will vary. It will depend on whether your pregnancy is considered to be low or high risk. For example, you may need certain tests if you have a medical problem that can put your unborn baby at risk. Health care providers use several techniques and tests to monitor your baby before birth. No single test is perfectly accurate, and you may need to follow up with your health care provider if there are any concerns. Fetal movement counts When you are past 20 weeks of pregnancy, you may feel your baby move. As your baby gets bigger, these movements are easier to feel. A fetal movement count is when you count the number of movements (kicks, flutters, swishes, rolls, or jabs) over a specific period of time. You record the number and report it to your health care provider. This is often recommended in high-risk pregnancies, but it is good for every pregnant woman to do. Your health care provider may ask you to start counting fetal movements at 28 weeks of pregnancy. Non-stress test The non-stress test:  Evaluates fetal heartbeat: ? While your baby is at rest. ? While your baby is moving.  Is often done as part of a set of tests called the biophysical profile.  May show signs that it is time for your baby to be delivered. The heart rate in a healthy baby will speed up when the baby moves or kicks. The heart rate will decrease at rest, and the peaks or accelerations of the heart rate will be lower. If the test brings up any questions or concerns, you may need to have more testing. This test may be done if your pregnancy goes  past your due date. It is also commonly done in high-risk pregnancies beginning at 32 weeks. Biophysical profile The biophysical profile is a set of tests that are done to find out how well the baby is doing. It includes the non-stress test along with imaging tests that use sound waves (ultrasound) to create images of your baby. In a biophysical profile, the following tests are done and scored:  Fetal heartbeat.  Fetal breathing.  Fetal movements.  Fetal muscle tone.  Amount of amniotic fluid. Each test gets a score of either 2 (normal) or 0 (abnormal). The scores are then added together for a total score. A low score may mean that you and your baby need additional monitoring or special care. Sometimes your health care provider may recommend that you deliver early. This test is usually done after 32 weeks of pregnancy. It can be done sooner, if needed. Contraction stress test A contraction stress test monitors the heart rate of your baby during contractions. The test checks to see how your baby will tolerate the stress of labor. Health care providers use this test to further evaluate your baby when other tests, such as the biophysical profile, have shown that there may be a problem. This test may be done:  Any time you are in labor.  Between weeks 32 and 34 of your pregnancy.  Later, if necessary. Modified biophysical profile A modified biophysical profile is a two-part test. You will have:  An ultrasound exam,  to check how much fluid is surrounding your baby inside of your womb (amniotic fluid).  A non-stress test, to check your baby's heart rate. The results will determine whether a full biophysical profile may be needed. This test is usually done late in your final 3 months (third trimester) of pregnancy. Umbilical artery Doppler velocimetry Umbilical artery Doppler velocimetry uses sound waves to measure the flow of blood between you and your baby. This test:  Measures the amount  of blood flow through the cord that attaches your baby to your womb (umbilical cord).  Measures the speed of the blood flow. You likely only need this test to check your baby's condition if you have a high-risk pregnancy. All types of monitoring aim to protect your health and that of your baby. The best way to have a healthy pregnancy and a healthy baby is to learn as much as you can about your pregnancy and to work closely with all your health care providers. This information is not intended to replace advice given to you by your health care provider. Make sure you discuss any questions you have with your health care provider. Document Revised: 06/10/2017 Document Reviewed: 04/15/2016 Elsevier Patient Education   [place].

## 2020-08-14 ENCOUNTER — Encounter: Payer: Self-pay | Admitting: Obstetrics & Gynecology

## 2020-08-14 ENCOUNTER — Encounter: Payer: Self-pay | Admitting: Obstetrics and Gynecology

## 2020-08-14 ENCOUNTER — Ambulatory Visit (INDEPENDENT_AMBULATORY_CARE_PROVIDER_SITE_OTHER): Payer: Managed Care, Other (non HMO) | Admitting: Obstetrics & Gynecology

## 2020-08-14 ENCOUNTER — Other Ambulatory Visit: Payer: Self-pay

## 2020-08-14 ENCOUNTER — Observation Stay
Admission: EM | Admit: 2020-08-14 | Discharge: 2020-08-14 | Disposition: A | Discharge status: Home / Self Care | Payer: Managed Care, Other (non HMO) | Attending: Obstetrics and Gynecology | Admitting: Obstetrics and Gynecology

## 2020-08-14 ENCOUNTER — Telehealth: Payer: Self-pay

## 2020-08-14 VITALS — BP 120/80 | Wt 248.0 lb

## 2020-08-14 DIAGNOSIS — O0992 Supervision of high risk pregnancy, unspecified, second trimester: Secondary | ICD-10-CM

## 2020-08-14 DIAGNOSIS — Z3A24 24 weeks gestation of pregnancy: Secondary | ICD-10-CM

## 2020-08-14 DIAGNOSIS — R109 Unspecified abdominal pain: Secondary | ICD-10-CM | POA: Diagnosis not present

## 2020-08-14 DIAGNOSIS — O99892 Other specified diseases and conditions complicating childbirth: Secondary | ICD-10-CM | POA: Diagnosis present

## 2020-08-14 DIAGNOSIS — Q270 Congenital absence and hypoplasia of umbilical artery: Secondary | ICD-10-CM | POA: Diagnosis not present

## 2020-08-14 DIAGNOSIS — Z7982 Long term (current) use of aspirin: Secondary | ICD-10-CM | POA: Insufficient documentation

## 2020-08-14 DIAGNOSIS — O4692 Antepartum hemorrhage, unspecified, second trimester: Secondary | ICD-10-CM | POA: Diagnosis not present

## 2020-08-14 DIAGNOSIS — O26899 Other specified pregnancy related conditions, unspecified trimester: Secondary | ICD-10-CM

## 2020-08-14 DIAGNOSIS — M5459 Other low back pain: Secondary | ICD-10-CM | POA: Diagnosis not present

## 2020-08-14 DIAGNOSIS — M549 Dorsalgia, unspecified: Secondary | ICD-10-CM

## 2020-08-14 DIAGNOSIS — O09899 Supervision of other high risk pregnancies, unspecified trimester: Secondary | ICD-10-CM

## 2020-08-14 DIAGNOSIS — O26892 Other specified pregnancy related conditions, second trimester: Secondary | ICD-10-CM | POA: Insufficient documentation

## 2020-08-14 DIAGNOSIS — O10919 Unspecified pre-existing hypertension complicating pregnancy, unspecified trimester: Secondary | ICD-10-CM

## 2020-08-14 DIAGNOSIS — O132 Gestational [pregnancy-induced] hypertension without significant proteinuria, second trimester: Secondary | ICD-10-CM | POA: Diagnosis not present

## 2020-08-14 DIAGNOSIS — R102 Pelvic and perineal pain: Secondary | ICD-10-CM | POA: Diagnosis not present

## 2020-08-14 DIAGNOSIS — O99891 Other specified diseases and conditions complicating pregnancy: Secondary | ICD-10-CM

## 2020-08-14 DIAGNOSIS — M545 Low back pain, unspecified: Secondary | ICD-10-CM | POA: Insufficient documentation

## 2020-08-14 DIAGNOSIS — Z2839 Other underimmunization status: Secondary | ICD-10-CM

## 2020-08-14 NOTE — Progress Notes (Signed)
Gynecology Pelvic Pain Evaluation   Chief Complaint  Patient presents with  . Pelvic Pain    pelvic pressure    History of Present Illness:   Patient is a 24 y.o. G2P0010 who LMP was Patient's last menstrual period was 02/28/2020 (approximate)., presents today for a problem visit.  She complains of pain.   Her pain is localized to the suprapubic and deep pelvis area, described as intermittent, began several days ago and its severity is described as mild. The pain radiates to the  Non-radiating. She has these associated symptoms which include no vag bleeding ir discharge. Patient has these modifiers which include relaxation that make it better and unable to associate with any factor that make it worse.  Context includes: stress over pregnancy (current [redacted] weeks EGA; has cHTN and recent discussion over SUA.)  PMHx: She  has a past medical history of Hypertension. Also,  has a past surgical history that includes No past surgeries., family history includes Diabetes in her mother; Healthy in her sister and sister; Hypertension in her maternal grandmother.,  reports that she has never smoked. She has never used smokeless tobacco. She reports that she does not drink alcohol and does not use drugs.  She has a current medication list which includes the following prescription(s): aspirin ec, doxylamine-pyridoxine, metoprolol succinate, nifedipine, prenatal multivitamin, and pyridoxine. Also, is allergic to zithromax [azithromycin].  Review of Systems  Gastrointestinal: Positive for abdominal pain.  Psychiatric/Behavioral: The patient is nervous/anxious.   All other systems reviewed and are negative.   Objective: BP 120/80   Wt 248 lb (112.5 kg)   LMP 02/28/2020 (Approximate)   BMI 37.71 kg/m  Physical Exam Constitutional:      General: She is not in acute distress.    Appearance: She is well-developed.  Genitourinary:     Pelvic exam was performed with patient supine.     Vagina and  uterus normal.     No vaginal erythema or bleeding.     No cervical motion tenderness, discharge, polyp or nabothian cyst.     Uterus is mobile.     Uterus is not enlarged.     No uterine mass detected.    Uterus is midaxial.     No right or left adnexal mass present.     Right adnexa not tender.     Left adnexa not tender.     Genitourinary Comments: CX not dilated  HENT:     Head: Normocephalic and atraumatic.     Nose: Nose normal.  Abdominal:     General: There is no distension.     Palpations: Abdomen is soft.     Tenderness: There is no abdominal tenderness.  Musculoskeletal:        General: Normal range of motion.  Neurological:     Mental Status: She is alert and oriented to person, place, and time.     Cranial Nerves: No cranial nerve deficit.  Skin:    General: Skin is warm and dry.  Psychiatric:        Attention and Perception: Attention normal.        Mood and Affect: Mood and affect normal.        Speech: Speech normal.        Behavior: Behavior normal.        Thought Content: Thought content normal.        Judgment: Judgment normal.     Female chaperone present for pelvic portion of the physical exam  Assessment: 24 y.o. G2P0010 with lower abdominal pain in pregnancy.  1. [redacted] weeks gestation of pregnancy  2. High-risk pregnancy, second trimester  3. Single umbilical artery  4. Chronic hypertension affecting pregnancy  5. Abdominal pain affecting pregnancy fFN done Monitor for s/sx PTL Reassured at this time    Barnett Applebaum, MD, [facility], [facility] 08/14/2020  4:36 PM

## 2020-08-14 NOTE — Discharge Summary (Addendum)
Physician Final Progress Note  Patient ID: Brenda Osborn MRN: [id number] DOB/AGE: 01/10/1996 24 y.o.  Admit date: 08/14/2020 Admitting provider: Rod Can, CNM Discharge date: 08/14/2020   Admission Diagnoses: pelvic pain, back pain  Discharge Diagnoses:  Active Problems:   Labor and delivery, indication for care   [redacted] weeks gestation of pregnancy   Pain of round ligament affecting pregnancy, antepartum   Back pain affecting pregnancy in second trimester Reactive NST  History of Present Illness: The patient is a 24 y.o. female G2P0010 at [redacted]w[redacted]d who presents for intermittent bilateral pelvic pain and constant stabbing pain in lower right back. She was seen earlier today in the office for the same. She had her cervix checked at the office visit today. Later in the day she had vaginal bleeding with wiping. She also noticed a clot in the toilet. The bleeding started out bright red and then changed to brown. She denies current bleeding. She tried position changes for the pain without relief. She has not tried heat, ice, tylenol, or soaking in the tub. She reports only drinking one bottle of water today.   She was admitted for observation and placed on monitors. Monitoring was reassuring. We discussed round ligament pain and back pain of pregnancy. Reassurance given that fetal heart rate is normal, there is no evidence of contractions and her placenta is not low lying. She declines Rx for flexeril for low back pain. She is discharged to home with instructions and precautions.   Past Medical History:  Diagnosis Date  . Hypertension     Past Surgical History:  Procedure Laterality Date  . NO PAST SURGERIES      No current facility-administered medications on file prior to encounter.   Current Outpatient Medications on File Prior to Encounter  Medication Sig Dispense Refill  . aspirin EC 81 MG tablet Take 1 tablet (81 mg total) by mouth daily. 300 tablet 2  . Doxylamine-Pyridoxine  10-10 MG TBEC Take 2 tabs qhs prn nausea.  Also can take One tab po q am and 2 tabs po qhs prn nausea. 60 tablet 2  . metoprolol succinate (TOPROL-XL) 50 MG 24 hr tablet Take by mouth 2 (two) times daily. Take 1 tab BID    . NIFEdipine (PROCARDIA-XL/NIFEDICAL-XL) 30 MG 24 hr tablet Take 1 tablet (30 mg total) by mouth daily. Can increase to twice a day as needed for symptomatic contractions 30 tablet 2  . Prenatal Vit-Fe Fumarate-FA (PRENATAL MULTIVITAMIN) TABS tablet Take 1 tablet by mouth daily at 12 noon.    . pyridOXINE (VITAMIN B-6) 25 MG tablet Take 25 mg by mouth daily. (Patient not taking: Reported on 08/14/2020)      Allergies  Allergen Reactions  . Zithromax [Azithromycin]     Respiratory Destress    Social History   Socioeconomic History  . Marital status: Married    Spouse name: Marden Noble   . Number of children: Not on file  . Years of education: Not on file  . Highest education level: Not on file  Occupational History  . Not on file  Tobacco Use  . Smoking status: Never Smoker  . Smokeless tobacco: Never Used  Vaping Use  . Vaping Use: Never used  Substance and Sexual Activity  . Alcohol use: No  . Drug use: No  . Sexual activity: Not Currently    Partners: Male  Other Topics Concern  . Not on file  Social History Narrative  . Not on file   Social Determinants  of Health   Financial Resource Strain:   . Difficulty of Paying Living Expenses: Not on file  Food Insecurity:   . Worried About Charity fundraiser in the Last Year: Not on file  . Ran Out of Food in the Last Year: Not on file  Transportation Needs:   . Lack of Transportation (Medical): Not on file  . Lack of Transportation (Non-Medical): Not on file  Physical Activity:   . Days of Exercise per Week: Not on file  . Minutes of Exercise per Session: Not on file  Stress:   . Feeling of Stress : Not on file  Social Connections:   . Frequency of Communication with Friends and Family: Not on file  .  Frequency of Social Gatherings with Friends and Family: Not on file  . Attends Religious Services: Not on file  . Active Member of Clubs or Organizations: Not on file  . Attends Archivist Meetings: Not on file  . Marital Status: Not on file  Intimate Partner Violence:   . Fear of Current or Ex-Partner: Not on file  . Emotionally Abused: Not on file  . Physically Abused: Not on file  . Sexually Abused: Not on file    Family History  Problem Relation Age of Onset  . Diabetes Mother        "Pre Diabetes"  . Healthy Sister   . Hypertension Maternal Grandmother   . Healthy Sister   . Breast cancer Neg Hx   . Ovarian cancer Neg Hx   . Colon cancer Neg Hx      Review of Systems  Constitutional: Negative for chills and fever.  HENT: Negative for congestion, ear discharge, ear pain, hearing loss, sinus pain and sore throat.   Eyes: Negative for blurred vision and double vision.  Respiratory: Negative for cough, shortness of breath and wheezing.   Cardiovascular: Negative for chest pain, palpitations and leg swelling.  Gastrointestinal: Positive for abdominal pain. Negative for blood in stool, constipation, diarrhea, heartburn, melena, nausea and vomiting.  Genitourinary: Negative for dysuria, flank pain, frequency, hematuria and urgency.  Musculoskeletal: Positive for back pain. Negative for joint pain and myalgias.  Skin: Negative for itching and rash.  Neurological: Negative for dizziness, tingling, tremors, sensory change, speech change, focal weakness, seizures, loss of consciousness, weakness and headaches.  Endo/Heme/Allergies: Negative for environmental allergies. Does not bruise/bleed easily.  Psychiatric/Behavioral: Negative for depression, hallucinations, memory loss, substance abuse and suicidal ideas. The patient is not nervous/anxious and does not have insomnia.      Physical Exam: LMP 02/28/2020 (Approximate)  BP: 127/79 Constitutional: Well nourished, well  developed female in no acute distress.  HEENT: normal Skin: Warm and dry.  Cardiovascular: Regular rate and rhythm.   Extremity: no edema  Respiratory: Clear to auscultation bilateral. Normal respiratory effort Abdomen: FHT present Back: no CVAT Neuro: DTRs 2+, Cranial nerves grossly intact Psych: Alert and Oriented x3. No memory deficits. Normal mood and affect.  MS: normal gait, normal bilateral lower extremity ROM/strength/stability.  Pelvic exam: no bleeding  Toco: negative for contractions Fetal Well Being: 145 bpm, moderate variability, +accelerations, -decelerations  Consults: None  Significant Findings/ Diagnostic Studies: none  Procedures: NST  Hospital Course: The patient was admitted to Labor and Delivery Triage for observation.   Discharge Condition: good  Disposition: Discharge disposition: 01-Home or Self Care  Diet: Regular diet, increase hydration  Discharge Activity: Activity as tolerated  Discharge Instructions    Discharge activity:  No Restrictions  Complete by: As directed    Epsom salt soaks, stretches, exercises, heat/ice, abdominal support band   Discharge diet:  No restrictions   Complete by: As directed    No sexual activity restrictions   Complete by: As directed    Notify physician for a general feeling that "something is not right"   Complete by: As directed    Notify physician for increase or change in vaginal discharge   Complete by: As directed    Notify physician for intestinal cramps, with or without diarrhea, sometimes described as "gas pain"   Complete by: As directed    Notify physician for leaking of fluid   Complete by: As directed    Notify physician for low, dull backache, unrelieved by heat or Tylenol   Complete by: As directed    Notify physician for menstrual like cramps   Complete by: As directed    Notify physician for pelvic pressure   Complete by: As directed    Notify physician for uterine contractions.  These  may be painless and feel like the uterus is tightening or the baby is  "balling up"   Complete by: As directed    Notify physician for vaginal bleeding   Complete by: As directed    PRETERM LABOR:  Includes any of the follwing symptoms that occur between 20 - [redacted] weeks gestation.  If these symptoms are not stopped, preterm labor can result in preterm delivery, placing your baby at risk   Complete by: As directed      Allergies as of 08/14/2020      Reactions   Zithromax [azithromycin]    Respiratory Destress      Medication List    STOP taking these medications   pyridOXINE 25 MG tablet Commonly known as: VITAMIN B-6     TAKE these medications   aspirin EC 81 MG tablet Take 1 tablet (81 mg total) by mouth daily.   Doxylamine-Pyridoxine 10-10 MG Tbec Take 2 tabs qhs prn nausea.  Also can take One tab po q am and 2 tabs po qhs prn nausea.   metoprolol succinate 50 MG 24 hr tablet Commonly known as: TOPROL-XL Take by mouth 2 (two) times daily. Take 1 tab BID   NIFEdipine 30 MG 24 hr tablet Commonly known as: PROCARDIA-XL/NIFEDICAL-XL Take 1 tablet (30 mg total) by mouth daily. Can increase to twice a day as needed for symptomatic contractions   prenatal multivitamin Tabs tablet Take 1 tablet by mouth daily at 12 noon.       [place]. Go to.   Specialty: Obstetrics and Gynecology Why: scheduled prenatal appointment Contact information: [address] [place] [address] [phone]              Total time spent taking care of this patient: 35 minutes  Signed: Rod Can, CNM  08/14/2020, 11:50 PM

## 2020-08-14 NOTE — Patient Instructions (Signed)
Fetal Fibronectin Test Why am I having this test? Fetal fibronectin (fFN) is a protein that your body makes during pregnancy. Having fFN in your vaginal fluid between 22 and 36 weeks of pregnancy could be a warning sign that your baby will be born early (prematurely). Babies born prematurely, or before 45 weeks, may have trouble breathing or feeding. You may have this test if you have symptoms of premature labor, such as:  Contractions.  More vaginal discharge.  Backache. What is being tested? This test checks the level of fFN in your vaginal fluid. What kind of sample is taken?  This test requires a sample of fluid from inside your vagina. This sample is collected by your health care provider using a cotton swab. How do I prepare for this test?  For 24 hours before the test, do not have sex or put anything into your vagina. Tell a health care provider about:  Any allergies you have.  Any medical conditions you have, especially any vaginal yeast infections or any symptoms of a yeast infection, including: ? Itchiness. ? Soreness. ? Unusual discharge. How are the results reported? Your results will be reported as positive or negative for fFN. If positive, results may also be given as micrograms of fFN per milliliter of vaginal fluid (mcg/mL). A result of 0.05 mcg/mL or less is considered negative. A false-positive result can occur. A false positive is incorrect because it means that a condition is present when it is not. A number of factors may lead to false-positive results, including vaginal bleeding, recent sexual intercourse, or a recent cervical exam. Your health care provider will talk to you about doing more tests to confirm your results. What do the results mean? A negative result means that no fFN was found in your vaginal fluid, meaning that premature delivery during the next 2 weeks is very unlikely. If you are still having symptoms of early labor, you may need to have this  test again in two weeks. A positive result means that fFN was found in your vaginal fluid. This means that your risk for premature labor is greater, but it does not mean that you will go into early labor. Your health care provider may do other tests and exams to closely monitor your pregnancy. Talk with your health care provider about what your results mean. Questions to ask your health care provider Ask your health care provider, or the department that is doing the test:  When will my results be ready?  How will I get my results?  What are my treatment options?  What other tests do I need?  What are my next steps? Summary  Fetal fibronectin (fFN) is a protein that your body makes during pregnancy.  Having fFN in your vaginal fluid between 22 and 36 weeks of pregnancy could be a warning sign that your baby will be born early (prematurely).  A negative result means that no fFN was found in your vaginal fluid, meaning that premature delivery over the next 2 weeks is very unlikely. This information is not intended to replace advice given to you by your health care provider. Make sure you discuss any questions you have with your health care provider. Document Revised: 05/08/2017 Document Reviewed: 05/08/2017 Elsevier Patient Education  [place].

## 2020-08-14 NOTE — Telephone Encounter (Signed)
Pt is experiencing a lot of pelvic pain and pressure and is worried because she is high risk.

## 2020-08-14 NOTE — OB Triage Note (Signed)
Pt is a 24yo G2P0, reports some abdominal pain and cramping. Pt was seen in the office today with cramping and pelvic pressure. Pt started to bleed after being checked at the office. Pt also reports some blood clots when going to the restroom this afternoon. Pain started around 8:30p, along with sharp pain in the lower back area. Positive fetal movement. Pt reports no LOF. Pt reports a normal amount of N/V/D with this pregnancy. Pt reports drinking about a bottle and a half of water today and a Dr. Malachi Bonds.

## 2020-08-14 NOTE — Telephone Encounter (Signed)
Patient is scheduled for 08/14/20 with RPH at 4:10

## 2020-08-14 NOTE — Telephone Encounter (Signed)
Patient is requesting call back.

## 2020-08-14 NOTE — Addendum Note (Signed)
Addended by: Gae Dry on: 08/14/2020 04:42 PM   Modules accepted: Orders

## 2020-08-15 LAB — FETAL FIBRONECTIN: Fetal Fibronectin: NEGATIVE

## 2020-08-29 ENCOUNTER — Ambulatory Visit (INDEPENDENT_AMBULATORY_CARE_PROVIDER_SITE_OTHER): Payer: Managed Care, Other (non HMO) | Admitting: Obstetrics and Gynecology

## 2020-08-29 ENCOUNTER — Other Ambulatory Visit: Payer: Self-pay

## 2020-08-29 ENCOUNTER — Encounter: Payer: Self-pay | Admitting: Obstetrics and Gynecology

## 2020-08-29 VITALS — BP 126/86 | Wt 250.0 lb

## 2020-08-29 DIAGNOSIS — O0992 Supervision of high risk pregnancy, unspecified, second trimester: Secondary | ICD-10-CM

## 2020-08-29 DIAGNOSIS — Z3A26 26 weeks gestation of pregnancy: Secondary | ICD-10-CM

## 2020-08-29 DIAGNOSIS — O99212 Obesity complicating pregnancy, second trimester: Secondary | ICD-10-CM

## 2020-08-29 DIAGNOSIS — O10919 Unspecified pre-existing hypertension complicating pregnancy, unspecified trimester: Secondary | ICD-10-CM

## 2020-08-29 DIAGNOSIS — O99210 Obesity complicating pregnancy, unspecified trimester: Secondary | ICD-10-CM | POA: Insufficient documentation

## 2020-08-29 DIAGNOSIS — Q270 Congenital absence and hypoplasia of umbilical artery: Secondary | ICD-10-CM

## 2020-08-29 NOTE — Progress Notes (Signed)
Routine Prenatal Care Visit  Subjective  Brenda Osborn is a 24 y.o. G2P0010 at [redacted]w[redacted]d being seen today for ongoing prenatal care.  She is currently monitored for the following issues for this high-risk pregnancy and has Headache; Hidradenitis suppurativa; Type A blood, Rh positive; Susceptible to varicella (non-immune), currently pregnant; Chronic hypertension affecting pregnancy; High-risk pregnancy, second trimester; Single umbilical artery; Labor and delivery, indication for care; [redacted] weeks gestation of pregnancy; Pain of round ligament affecting pregnancy, antepartum; Back pain affecting pregnancy in second trimester; and Obesity affecting pregnancy on their problem list.  ----------------------------------------------------------------------------------- Patient reports ongoing cramping in uterus. She had an issue before, had some testing and had bleeding afterward. This bleeding has resolved.  She still feels uncomfortable. Declines pelvic today.   Contractions: Irregular. Vag. Bleeding: None.  Movement: Present. Leaking Fluid denies.  ----------------------------------------------------------------------------------- The following portions of the patient's history were reviewed and updated as appropriate: allergies, current medications, past family history, past medical history, past social history, past surgical history and problem list. Problem list updated.  Objective  Blood pressure 126/86, weight 250 lb (113.4 kg), last menstrual period 02/28/2020. Pregravid weight 245 lb (111.1 kg) Total Weight Gain 5 lb (2.268 kg) Urinalysis: Urine Protein    Urine Glucose    Fetal Status: Fetal Heart Rate (bpm): 150 Fundal Height: 26 cm Movement: Present     General:  Alert, oriented and cooperative. Patient is in no acute distress.  Skin: Skin is warm and dry. No rash noted.   Cardiovascular: Normal heart rate noted  Respiratory: Normal respiratory effort, no problems with respiration noted   Abdomen: Soft, gravid, appropriate for gestational age. Pain/Pressure: Present     Pelvic:  Cervical exam deferred        Extremities: Normal range of motion.  Edema: None  Mental Status: Normal mood and affect. Normal behavior. Normal judgment and thought content.   Assessment   24 y.o. G2P0010 at [redacted]w[redacted]d by  12/04/2020, Date entered prior to episode creation presenting for routine prenatal visit  Plan   G1 Problems (from 05/11/20 to present)    Problem Noted Resolved   Obesity affecting pregnancy 08/29/2020 by Will Bonnet, MD No   [redacted] weeks gestation of pregnancy 08/14/2020 by Rod Can, CNM No   Pain of round ligament affecting pregnancy, antepartum 08/14/2020 by Rod Can, CNM No   Back pain affecting pregnancy in second trimester 08/14/2020 by Rod Can, CNM No   Single umbilical artery 42/03/622 by Gae Dry, MD No   Overview Signed 08/08/2020  3:10 PM by Gae Dry, MD    Growth [place] 30 weeks APT weekly 32 weeks IOL 39 weeks      High-risk pregnancy, second trimester 07/24/2020 by Gae Dry, MD No   Overview Signed 07/24/2020  8:45 AM by Gae Dry, MD    Clinic [facility] Prenatal Labs  Dating LMP, [place] confirmed 12 wks Blood type: A/Positive/-- (08/12 1449)   Genetic Screen      NIPS:nml XX Antibody:Negative (08/12 1449)  Anatomic [place]  Rubella: 1.66 (08/12 1449) Varicella:NI  GTT Early:nml A1C    Third trimester:  RPR: Non Reactive (08/12 1449)   Rhogam n/a HBsAg: Negative (08/12 1449)   TDaP vaccine                       Flu Shot: HIV: Non Reactive (08/12 1449)   Baby Food  GBS:   Contraception  Pap:2019, needs pp  CBB  No   CS/VBAC N/a   Support Person Husband           Chronic hypertension affecting pregnancy 05/26/2020 by Diona Fanti, CNM No   Overview Signed 07/24/2020  8:46 AM by Gae Dry, MD    [x ] Aspirin 81 mg daily after 12 weeks; discontinue after 36 weeks [x ]  baseline labs with CBC, CMP, urine protein/creatinine ratio [ ]  ultrasound for growth at 28, 32, 36 weeks [x ] Baseline EKG   Current antihypertensives:  Metoprolol     Procardia added 07/24/20   Baseline and surveillance labs (pulled in from [facility], refresh links as needed)  Lab Results  Component Value Date   PLT 298 06/21/2020   CREATININE 0.60 06/21/2020   AST 35 06/21/2020   ALT 46 (H) 06/21/2020    Antenatal Testing CHTN - O10.919  Group I  BP < 140/90, no preeclampsia, AGA,  nml AFV, +/- meds    Group II BP > 140/90, on meds, no preeclampsia, AGA, nml AFV  20-28-34-38  20-24-28-32-35-38  32//2 x wk  28//BPP wkly then 32//2 x wk  40 no meds; 39 meds  PRN or 37  Pre-eclampsia  GHTN - O13.9/Preeclampsia without severe features  - O14.00   Preeclampsia with severe features - O14.10  Q 3-4wks  Q 2 wks  28//BPP wkly then 32//2 x wk  Inpatient  37  PRN or 34         Susceptible to varicella (non-immune), currently pregnant 05/12/2020 by Diona Fanti, CNM No       Preterm labor symptoms and general obstetric precautions including but not limited to vaginal bleeding, contractions, leaking of fluid and fetal movement were reviewed in detail with the patient. Please refer to After Visit Summary for other counseling recommendations.  -Reassured patient and provided precautions about when to return to clinic or hospital.   - 1h gtt w 28 week labs today and LFTs  Return in about 2 weeks (around 09/12/2020) for previously scheduled appts.   Prentice Docker, MD, [facility] 08/29/2020 12:24 PM

## 2020-08-30 ENCOUNTER — Telehealth: Payer: Self-pay | Admitting: Obstetrics & Gynecology

## 2020-08-30 ENCOUNTER — Other Ambulatory Visit: Payer: Self-pay | Admitting: Obstetrics & Gynecology

## 2020-08-30 DIAGNOSIS — O99810 Abnormal glucose complicating pregnancy: Secondary | ICD-10-CM

## 2020-08-30 LAB — 28 WEEK RH+PANEL
Basophils Absolute: 0 10*3/uL (ref 0.0–0.2)
Basos: 0 %
EOS (ABSOLUTE): 0.1 10*3/uL (ref 0.0–0.4)
Eos: 1 %
Gestational Diabetes Screen: 143 mg/dL — ABNORMAL HIGH (ref 65–139)
HIV Screen 4th Generation wRfx: NONREACTIVE
Hematocrit: 34.4 % (ref 34.0–46.6)
Hemoglobin: 11.9 g/dL (ref 11.1–15.9)
Immature Grans (Abs): 0.1 10*3/uL (ref 0.0–0.1)
Immature Granulocytes: 1 %
Lymphocytes Absolute: 1.8 10*3/uL (ref 0.7–3.1)
Lymphs: 17 %
MCH: 33.4 pg — ABNORMAL HIGH (ref 26.6–33.0)
MCHC: 34.6 g/dL (ref 31.5–35.7)
MCV: 97 fL (ref 79–97)
Monocytes Absolute: 0.8 10*3/uL (ref 0.1–0.9)
Monocytes: 7 %
Neutrophils Absolute: 7.9 10*3/uL — ABNORMAL HIGH (ref 1.4–7.0)
Neutrophils: 74 %
Platelets: 316 10*3/uL (ref 150–450)
RBC: 3.56 x10E6/uL — ABNORMAL LOW (ref 3.77–5.28)
RDW: 11.9 % (ref 11.7–15.4)
RPR Ser Ql: NONREACTIVE
WBC: 10.6 10*3/uL (ref 3.4–10.8)

## 2020-08-30 LAB — CMP AND LIVER
ALT: 20 IU/L (ref 0–32)
AST: 18 IU/L (ref 0–40)
Albumin: 3.8 g/dL — ABNORMAL LOW (ref 3.9–5.0)
Alkaline Phosphatase: 127 IU/L — ABNORMAL HIGH (ref 44–121)
BUN: 5 mg/dL — ABNORMAL LOW (ref 6–20)
Bilirubin Total: 0.4 mg/dL (ref 0.0–1.2)
Bilirubin, Direct: 0.13 mg/dL (ref 0.00–0.40)
CO2: 20 mmol/L (ref 20–29)
Calcium: 9 mg/dL (ref 8.7–10.2)
Chloride: 102 mmol/L (ref 96–106)
Creatinine, Ser: 0.71 mg/dL (ref 0.57–1.00)
GFR calc Af Amer: 139 mL/min/{1.73_m2} (ref >59)
GFR calc non Af Amer: 120 mL/min/{1.73_m2} (ref >59)
Glucose: 135 mg/dL — ABNORMAL HIGH (ref 65–99)
Potassium: 3.7 mmol/L (ref 3.5–5.2)
Sodium: 139 mmol/L (ref 134–144)
Total Protein: 6.5 g/dL (ref 6.0–8.5)

## 2020-08-30 NOTE — Telephone Encounter (Signed)
Called and left voicemail for patient to call back to be scheduled. 

## 2020-08-30 NOTE — Telephone Encounter (Signed)
-----   Message from Gae Dry, MD sent at 08/30/2020  9:57 AM EST ----- Sch 3 hr GTT MyChart message sent Slightly elevated Glucola requiring f/u testing

## 2020-09-01 ENCOUNTER — Other Ambulatory Visit: Payer: Managed Care, Other (non HMO)

## 2020-09-01 ENCOUNTER — Other Ambulatory Visit: Payer: Self-pay

## 2020-09-01 DIAGNOSIS — O99810 Abnormal glucose complicating pregnancy: Secondary | ICD-10-CM

## 2020-09-02 LAB — GESTATIONAL GLUCOSE TOLERANCE
Glucose, Fasting: 75 mg/dL (ref 65–94)
Glucose, GTT - 1 Hour: 112 mg/dL (ref 65–179)
Glucose, GTT - 2 Hour: 88 mg/dL (ref 65–154)
Glucose, GTT - 3 Hour: 74 mg/dL (ref 65–139)

## 2020-09-12 ENCOUNTER — Encounter: Payer: Managed Care, Other (non HMO) | Admitting: Obstetrics & Gynecology

## 2020-09-12 ENCOUNTER — Ambulatory Visit: Payer: Managed Care, Other (non HMO)

## 2020-09-14 ENCOUNTER — Other Ambulatory Visit: Payer: Self-pay

## 2020-09-14 ENCOUNTER — Ambulatory Visit (INDEPENDENT_AMBULATORY_CARE_PROVIDER_SITE_OTHER): Payer: Managed Care, Other (non HMO)

## 2020-09-14 ENCOUNTER — Encounter: Payer: Self-pay | Admitting: Obstetrics and Gynecology

## 2020-09-14 ENCOUNTER — Ambulatory Visit (INDEPENDENT_AMBULATORY_CARE_PROVIDER_SITE_OTHER): Payer: Managed Care, Other (non HMO) | Admitting: Obstetrics and Gynecology

## 2020-09-14 VITALS — BP 120/80 | Wt 248.0 lb

## 2020-09-14 DIAGNOSIS — Q270 Congenital absence and hypoplasia of umbilical artery: Secondary | ICD-10-CM

## 2020-09-14 DIAGNOSIS — O10919 Unspecified pre-existing hypertension complicating pregnancy, unspecified trimester: Secondary | ICD-10-CM | POA: Diagnosis not present

## 2020-09-14 DIAGNOSIS — O0993 Supervision of high risk pregnancy, unspecified, third trimester: Secondary | ICD-10-CM

## 2020-09-14 DIAGNOSIS — O99213 Obesity complicating pregnancy, third trimester: Secondary | ICD-10-CM

## 2020-09-14 DIAGNOSIS — Z3A27 27 weeks gestation of pregnancy: Secondary | ICD-10-CM

## 2020-09-14 NOTE — Progress Notes (Signed)
Routine Prenatal Care Visit  Subjective  Brenda Osborn is a 24 y.o. G2P0010 at [redacted]w[redacted]d being seen today for ongoing prenatal care.  She is currently monitored for the following issues for this high-risk pregnancy and has Headache; Hidradenitis suppurativa; Type A blood, Rh positive; Susceptible to varicella (non-immune), currently pregnant; Chronic hypertension affecting pregnancy; High-risk pregnancy, second trimester; Single umbilical artery; Pain of round ligament affecting pregnancy, antepartum; Back pain affecting pregnancy in second trimester; and Obesity affecting pregnancy on their problem list.  ----------------------------------------------------------------------------------- Patient reports occasional pain on left side radiating toward pelvis. Associates pain with movement..   Contractions: Not present. Vag. Bleeding: None.  Movement: Present. Denies leaking of fluid.  ----------------------------------------------------------------------------------- The following portions of the patient's history were reviewed and updated as appropriate: allergies, current medications, past family history, past medical history, past social history, past surgical history and problem list. Problem list updated.   Objective  Blood pressure 120/80, weight 248 lb (112.5 kg), last menstrual period 02/28/2020. Pregravid weight 245 lb (111.1 kg) Total Weight Gain 3 lb (1.361 kg) Urinalysis:      Fetal Status: Fetal Heart Rate (bpm): 145 Fundal Height: 28 cm Movement: Present     General:  Alert, oriented and cooperative. Patient is in no acute distress.  Skin: Skin is warm and dry. No rash noted.   Cardiovascular: Normal heart rate noted  Respiratory: Normal respiratory effort, no problems with respiration noted  Abdomen: Soft, gravid, appropriate for gestational age. Pain/Pressure: Absent     Pelvic:  Cervical exam deferred        Extremities: Normal range of motion.  Edema: None  ental Status:  Normal mood and affect. Normal behavior. Normal judgment and thought content.     Assessment   24 y.o. G2P0010 at [redacted]w[redacted]d by  12/04/2020, Date entered prior to episode creation presenting for routine prenatal visit  Plan   G1 Problems (from 05/11/20 to present)    Problem Noted Resolved   Obesity affecting pregnancy 08/29/2020 by Will Bonnet, MD No   Pain of round ligament affecting pregnancy, antepartum 08/14/2020 by Rod Can, CNM No   Back pain affecting pregnancy in second trimester 08/14/2020 by Rod Can, CNM No   Single umbilical artery 43/11/2949 by Gae Dry, MD No   Overview Signed 08/08/2020  3:10 PM by Gae Dry, MD    Growth [place] 30 weeks APT weekly 32 weeks IOL 39 weeks      High-risk pregnancy, second trimester 07/24/2020 by Gae Dry, MD No   Overview Signed 07/24/2020  8:45 AM by Gae Dry, MD    Clinic [facility] Prenatal Labs  Dating LMP, [place] confirmed 12 wks Blood type: A/Positive/-- (08/12 1449)   Genetic Screen      NIPS:nml XX Antibody:Negative (08/12 1449)  Anatomic [place]  Rubella: 1.66 (08/12 1449) Varicella:NI  GTT Early:nml A1C    Third trimester:  RPR: Non Reactive (08/12 1449)   Rhogam n/a HBsAg: Negative (08/12 1449)   TDaP vaccine                       Flu Shot: HIV: Non Reactive (08/12 1449)   Baby Food                                GBS:   Contraception  Pap:2019, needs pp  CBB  No   CS/VBAC N/a   Support Person  Husband           Chronic hypertension affecting pregnancy 05/26/2020 by Diona Fanti, CNM No   Overview Signed 07/24/2020  8:46 AM by Gae Dry, MD    [x ] Aspirin 81 mg daily after 12 weeks; discontinue after 36 weeks [x ] baseline labs with CBC, CMP, urine protein/creatinine ratio [x]  ultrasound for growth at 28, 32, 36 weeks [x ] Baseline EKG   Current antihypertensives:  Metoprolol     Procardia added 07/24/20   Baseline and surveillance labs (pulled in from [facility], refresh  links as needed)  Lab Results  Component Value Date   PLT 298 06/21/2020   CREATININE 0.60 06/21/2020   AST 35 06/21/2020   ALT 46 (H) 06/21/2020    Antenatal Testing CHTN - O10.919  Group I  BP < 140/90, no preeclampsia, AGA,  nml AFV, +/- meds    Group II BP > 140/90, on meds, no preeclampsia, AGA, nml AFV  20-28-34-38  20-24-28-32-35-38  32//2 x wk  28//BPP wkly then 32//2 x wk  40 no meds; 39 meds  PRN or 37  Pre-eclampsia  GHTN - O13.9/Preeclampsia without severe features  - O14.00   Preeclampsia with severe features - O14.10  Q 3-4wks  Q 2 wks  28//BPP wkly then 32//2 x wk  Inpatient  37  PRN or 34         Susceptible to varicella (non-immune), currently pregnant 05/12/2020 by Diona Fanti, CNM No   [redacted] weeks gestation of pregnancy 08/14/2020 by Rod Can, CNM 09/14/2020 by Orlie Pollen, CNM      -Growth US findings discussed with Dr. Kenton Kingfisher - plan of care reviewed with MD -Reviewed growth US findings with patient - growth percentile: 44.2%, AC: 48.7%, BPD <2.3%, HC <2.3% -MFM referral made and discussed with patient   Preterm labor precautions including but not limited to vaginal bleeding, contractions, leaking of fluid and fetal movement were reviewed in detail with the patient.    Return in about 2 weeks (around 09/28/2020) for HROB and growth [place].  Orlie Pollen, CNM, MSN [facility], [facility] 09/14/2020, 9:44 PM

## 2020-09-18 ENCOUNTER — Ambulatory Visit: Payer: Managed Care, Other (non HMO) | Attending: Obstetrics and Gynecology

## 2020-09-18 ENCOUNTER — Other Ambulatory Visit: Payer: Self-pay | Admitting: Obstetrics & Gynecology

## 2020-09-18 ENCOUNTER — Other Ambulatory Visit: Payer: Self-pay

## 2020-09-18 DIAGNOSIS — O0992 Supervision of high risk pregnancy, unspecified, second trimester: Secondary | ICD-10-CM

## 2020-09-18 DIAGNOSIS — Q270 Congenital absence and hypoplasia of umbilical artery: Secondary | ICD-10-CM

## 2020-09-18 DIAGNOSIS — Z3A29 29 weeks gestation of pregnancy: Secondary | ICD-10-CM | POA: Diagnosis not present

## 2020-09-18 DIAGNOSIS — O10913 Unspecified pre-existing hypertension complicating pregnancy, third trimester: Secondary | ICD-10-CM | POA: Diagnosis not present

## 2020-09-18 DIAGNOSIS — O99891 Other specified diseases and conditions complicating pregnancy: Secondary | ICD-10-CM

## 2020-09-18 DIAGNOSIS — O10013 Pre-existing essential hypertension complicating pregnancy, third trimester: Secondary | ICD-10-CM | POA: Diagnosis not present

## 2020-09-18 DIAGNOSIS — O358XX0 Maternal care for other (suspected) fetal abnormality and damage, not applicable or unspecified: Secondary | ICD-10-CM | POA: Diagnosis not present

## 2020-09-18 DIAGNOSIS — O365930 Maternal care for other known or suspected poor fetal growth, third trimester, not applicable or unspecified: Secondary | ICD-10-CM | POA: Diagnosis not present

## 2020-09-18 DIAGNOSIS — O99212 Obesity complicating pregnancy, second trimester: Secondary | ICD-10-CM

## 2020-09-18 DIAGNOSIS — O26899 Other specified pregnancy related conditions, unspecified trimester: Secondary | ICD-10-CM

## 2020-09-18 DIAGNOSIS — O09899 Supervision of other high risk pregnancies, unspecified trimester: Secondary | ICD-10-CM

## 2020-09-18 DIAGNOSIS — O10919 Unspecified pre-existing hypertension complicating pregnancy, unspecified trimester: Secondary | ICD-10-CM

## 2020-09-23 ENCOUNTER — Observation Stay
Admission: EM | Admit: 2020-09-23 | Discharge: 2020-09-23 | Disposition: A | Discharge status: Home / Self Care | Payer: Managed Care, Other (non HMO) | Attending: Obstetrics & Gynecology | Admitting: Obstetrics & Gynecology

## 2020-09-23 ENCOUNTER — Encounter: Payer: Self-pay | Admitting: Obstetrics & Gynecology

## 2020-09-23 ENCOUNTER — Other Ambulatory Visit: Payer: Self-pay

## 2020-09-23 DIAGNOSIS — O0992 Supervision of high risk pregnancy, unspecified, second trimester: Secondary | ICD-10-CM

## 2020-09-23 DIAGNOSIS — Z79899 Other long term (current) drug therapy: Secondary | ICD-10-CM | POA: Diagnosis not present

## 2020-09-23 DIAGNOSIS — O133 Gestational [pregnancy-induced] hypertension without significant proteinuria, third trimester: Secondary | ICD-10-CM | POA: Diagnosis not present

## 2020-09-23 DIAGNOSIS — M549 Dorsalgia, unspecified: Secondary | ICD-10-CM

## 2020-09-23 DIAGNOSIS — Z3A29 29 weeks gestation of pregnancy: Secondary | ICD-10-CM | POA: Insufficient documentation

## 2020-09-23 DIAGNOSIS — Q270 Congenital absence and hypoplasia of umbilical artery: Secondary | ICD-10-CM

## 2020-09-23 DIAGNOSIS — O4693 Antepartum hemorrhage, unspecified, third trimester: Secondary | ICD-10-CM | POA: Diagnosis not present

## 2020-09-23 DIAGNOSIS — O10919 Unspecified pre-existing hypertension complicating pregnancy, unspecified trimester: Secondary | ICD-10-CM

## 2020-09-23 DIAGNOSIS — R102 Pelvic and perineal pain: Secondary | ICD-10-CM

## 2020-09-23 DIAGNOSIS — Z7982 Long term (current) use of aspirin: Secondary | ICD-10-CM | POA: Insufficient documentation

## 2020-09-23 DIAGNOSIS — O99212 Obesity complicating pregnancy, second trimester: Secondary | ICD-10-CM

## 2020-09-23 DIAGNOSIS — O09899 Supervision of other high risk pregnancies, unspecified trimester: Secondary | ICD-10-CM

## 2020-09-23 DIAGNOSIS — O26853 Spotting complicating pregnancy, third trimester: Secondary | ICD-10-CM | POA: Diagnosis not present

## 2020-09-23 LAB — OB RESULTS CONSOLE GC/CHLAMYDIA
Chlamydia: NEGATIVE
Gonorrhea: NEGATIVE

## 2020-09-23 LAB — CHLAMYDIA/NGC RT PCR (ARMC ONLY)
Chlamydia Tr: NOT DETECTED
N gonorrhoeae: NOT DETECTED

## 2020-09-23 LAB — FETAL FIBRONECTIN: Fetal Fibronectin: NEGATIVE

## 2020-09-23 MED ORDER — ACETAMINOPHEN 325 MG PO TABS: 650.0000 mg | ORAL_TABLET | ORAL | Status: DC | PRN

## 2020-09-23 NOTE — Discharge Summary (Signed)
See FPN

## 2020-09-23 NOTE — Progress Notes (Signed)
Discharge home. Discharge instructions reviewed. Questions answered. Left floor ambulatory. ,  S  

## 2020-09-23 NOTE — Final Progress Note (Signed)
Physician Final Progress Note  Patient ID: Brenda Osborn MRN: [id number] DOB/AGE: 12-05-1995 24 y.o.  Admit date: 09/23/2020 Admitting provider: Gae Dry, MD Discharge date: 09/23/2020   Admission Diagnoses: [redacted] weeks EGA; Third Trimester Bleeding  Discharge Diagnoses:  Active Problems:   Third trimester bleeding   Consults: None  Significant Findings/ Diagnostic Studies:  Obstetrics Admission History & Physical   Vaginal Bleeding   HPI:  24 y.o. G2P0010 @ [redacted]w[redacted]d (12/04/2020, Date entered prior to episode creation). Admitted on 09/23/2020:   Patient Active Problem List   Diagnosis Date Noted  . Third trimester bleeding 09/23/2020  . Obesity affecting pregnancy 08/29/2020  . Pain of round ligament affecting pregnancy, antepartum 08/14/2020  . Back pain affecting pregnancy in second trimester 08/14/2020  . Single umbilical artery 73/41/9379  . High-risk pregnancy, second trimester 07/24/2020  . Chronic hypertension affecting pregnancy 05/26/2020  . Type A blood, Rh positive 05/12/2020  . Susceptible to varicella (non-immune), currently pregnant 05/12/2020  . Hidradenitis suppurativa 10/03/2017  . Headache 03/28/2017     Presents for one episode of spotting noted today at 1230. Some mild cramping yesterday and today.  No sex, trauma (othetr than dog jumpig on her.)  No ROM. Recent tx for URI, then for vag yeast infection.  Prenatal care at: at [facility]. Pregnancy complicated by SUA.  ROS: A review of systems was performed and negative, except as stated in the above HPI. PMHx:  Past Medical History:  Diagnosis Date  . Hypertension    PSHx:  Past Surgical History:  Procedure Laterality Date  . NO PAST SURGERIES     Medications:  Medications Prior to Admission  Medication Sig Dispense Refill Last Dose  . aspirin EC 81 MG tablet Take 1 tablet (81 mg total) by mouth daily. 300 tablet 2 09/23/2020 at Unknown time  . metoprolol succinate (TOPROL-XL) 50 MG 24 hr  tablet Take by mouth 2 (two) times daily. Take 1 tab BID   09/23/2020 at Unknown time  . NIFEdipine (PROCARDIA-XL/NIFEDICAL-XL) 30 MG 24 hr tablet Take 1 tablet (30 mg total) by mouth daily. Can increase to twice a day as needed for symptomatic contractions 30 tablet 2 09/23/2020 at Unknown time  . Prenatal Vit-Fe Fumarate-FA (PRENATAL MULTIVITAMIN) TABS tablet Take 1 tablet by mouth daily at 12 noon.   09/23/2020 at Unknown time  . Specialty Vitamins Products (MAGNESIUM, AMINO ACID CHELATE,) 133 MG tablet Take 2 tablets by mouth 2 (two) times daily.   09/23/2020 at Unknown time   Allergies: is allergic to zithromax [azithromycin]. OBHx:  OB History  Gravida Para Term Preterm AB Living  2 0 0 0 1 0  SAB IAB Ectopic Multiple Live Births  1 0 0 0 0    # Outcome Date GA Lbr Len/2nd Weight Sex Delivery Anes PTL Lv  2 Current           1 SAB 08/2019           KWI:OXBDZHGD/JMEQASTMHDQQ except as detailed in HPI.Marland Kitchen  No family history of birth defects. Soc Hx: Alcohol: none and Recreational drug use: none  Objective:   Vitals:   09/23/20 1337  BP: 131/77  Pulse: (!) 110  Resp: 18  Temp: 98.4 F (36.9 C)   Constitutional: Well nourished, well developed female in no acute distress.  HEENT: normal Skin: Warm and dry.  Cardiovascular:Regular rate and rhythm.   Extremity: trace to 1+ bilateral pedal edema Respiratory: Clear to auscultation bilateral. Normal respiratory effort Abdomen: gravid, ND,  FHT present, mild tenderness on exam Back: no CVAT Neuro: DTRs 2+, Cranial nerves grossly intact Psych: Alert and Oriented x3. No memory deficits. Normal mood and affect.  MS: normal gait, normal bilateral lower extremity ROM/strength/stability.  Pelvic exam: is not limited by body habitus EGBUS: within normal limits Vagina: within normal limits and with normal mucosa Cervix: CERVIX: 0 cm dilated, 0 effaced, -3 station Uterus: No contractions observed for 30  minutes.  Adnexa: not  evaluated  Assessment & Plan:   24 y.o. G2P0010 @ [redacted]w[redacted]d, Admitted on 09/23/2020:third trimester bleeding, no s/sx PTL.  Await fFN and culture for reasons for cervicitis or PTL risks. Activity restrictions discussed    Procedures: A NST procedure was performed with FHR monitoring and a normal baseline established, appropriate time of 20-40 minutes of evaluation, and accels >2 seen w 15x15 characteristics.  Results show a REACTIVE NST.   Discharge Condition: good  Disposition:  There are no questions and answers to display.        Diet: Regular diet  Discharge Activity: Activity as tolerated   Total time spent taking care of this patient: 20 minutes  Signed: Hoyt Koch 09/23/2020, 3:05 PM

## 2020-09-23 NOTE — OB Triage Note (Signed)
Pt reports spotting this afternoon X 1. Pt reports an episode of cramping yesterday x about 30 minutes. Off and on cramps today. Pt also reports increase in pressure in abdomen, pelvis, and back. Dineen Kid

## 2020-09-30 NOTE — L&D Delivery Note (Signed)
Vaginal Delivery Note  Spontaneous delivery of live viable female infant from the ROA position through an intact perineum. Delivery of anterior left shoulder with gentle downward guidance followed by delivery of the right posterior shoulder with gentle upward guidance. Body followed spontaneously. Infant placed on maternal chest. Nursery present and helped with neonatal resuscitation and evaluation. Cord clamped and cut after three minute. Placenta delivered spontaneously and intact with a 2 vessel cord.  Second degree laceration was assessed and repaired in standard fashion with 3-0 Vicryl. Uterus firm and below umbilicus at the end of the delivery.  Mom and baby recovering in stable condition. Sponge and needle counts were correct at the end of the delivery.  APGARS: 1 minute: 7  5 minutes: 8 Weight: pending Epidural: placed  Orlie Pollen, CNM, MSN [facility] 11/19/20 11:01 PM

## 2020-10-01 ENCOUNTER — Other Ambulatory Visit: Payer: Self-pay

## 2020-10-01 ENCOUNTER — Emergency Department: Payer: Managed Care, Other (non HMO)

## 2020-10-01 ENCOUNTER — Other Ambulatory Visit
Admission: RE | Admit: 2020-10-01 | Discharge: 2020-10-01 | Disposition: A | Discharge status: Home / Self Care | Payer: Managed Care, Other (non HMO) | Source: Ambulatory Visit | Attending: Family Medicine | Admitting: Family Medicine

## 2020-10-01 DIAGNOSIS — R0981 Nasal congestion: Secondary | ICD-10-CM | POA: Insufficient documentation

## 2020-10-01 DIAGNOSIS — R0789 Other chest pain: Secondary | ICD-10-CM | POA: Diagnosis not present

## 2020-10-01 DIAGNOSIS — R350 Frequency of micturition: Secondary | ICD-10-CM | POA: Insufficient documentation

## 2020-10-01 DIAGNOSIS — R000 Tachycardia, unspecified: Secondary | ICD-10-CM | POA: Insufficient documentation

## 2020-10-01 DIAGNOSIS — Z79899 Other long term (current) drug therapy: Secondary | ICD-10-CM | POA: Diagnosis not present

## 2020-10-01 DIAGNOSIS — Z7982 Long term (current) use of aspirin: Secondary | ICD-10-CM | POA: Insufficient documentation

## 2020-10-01 DIAGNOSIS — O169 Unspecified maternal hypertension, unspecified trimester: Secondary | ICD-10-CM | POA: Insufficient documentation

## 2020-10-01 DIAGNOSIS — O99113 Other diseases of the blood and blood-forming organs and certain disorders involving the immune mechanism complicating pregnancy, third trimester: Secondary | ICD-10-CM | POA: Insufficient documentation

## 2020-10-01 DIAGNOSIS — R059 Cough, unspecified: Secondary | ICD-10-CM | POA: Diagnosis not present

## 2020-10-01 DIAGNOSIS — R7989 Other specified abnormal findings of blood chemistry: Secondary | ICD-10-CM

## 2020-10-01 DIAGNOSIS — Z3A31 31 weeks gestation of pregnancy: Secondary | ICD-10-CM | POA: Diagnosis not present

## 2020-10-01 DIAGNOSIS — R791 Abnormal coagulation profile: Secondary | ICD-10-CM | POA: Diagnosis not present

## 2020-10-01 DIAGNOSIS — I10 Essential (primary) hypertension: Secondary | ICD-10-CM | POA: Insufficient documentation

## 2020-10-01 LAB — CBC
HCT: 34.2 % — ABNORMAL LOW (ref 36.0–46.0)
Hemoglobin: 12.2 g/dL (ref 12.0–15.0)
MCH: 33.9 pg (ref 26.0–34.0)
MCHC: 35.7 g/dL (ref 30.0–36.0)
MCV: 95 fL (ref 80.0–100.0)
Platelets: 334 10*3/uL (ref 150–400)
RBC: 3.6 MIL/uL — ABNORMAL LOW (ref 3.87–5.11)
RDW: 11.9 % (ref 11.5–15.5)
WBC: 13.1 10*3/uL — ABNORMAL HIGH (ref 4.0–10.5)
nRBC: 0 % (ref 0.0–0.2)

## 2020-10-01 LAB — FIBRIN DERIVATIVES D-DIMER (ARMC ONLY): Fibrin derivatives D-dimer (ARMC): 1278.06 ng/mL (FEU) — ABNORMAL HIGH (ref 0.00–499.00)

## 2020-10-01 LAB — BASIC METABOLIC PANEL
Anion gap: 9 (ref 5–15)
BUN: 6 mg/dL (ref 6–20)
CO2: 21 mmol/L — ABNORMAL LOW (ref 22–32)
Calcium: 8.7 mg/dL — ABNORMAL LOW (ref 8.9–10.3)
Chloride: 108 mmol/L (ref 98–111)
Creatinine, Ser: 0.47 mg/dL (ref 0.44–1.00)
GFR, Estimated: >60 mL/min (ref >60)
Glucose, Bld: 93 mg/dL (ref 70–99)
Potassium: 3.8 mmol/L (ref 3.5–5.1)
Sodium: 138 mmol/L (ref 135–145)

## 2020-10-01 LAB — BRAIN NATRIURETIC PEPTIDE: B Natriuretic Peptide: 64.6 pg/mL (ref 0.0–100.0)

## 2020-10-01 LAB — TROPONIN I (HIGH SENSITIVITY): Troponin I (High Sensitivity): 5 ng/L (ref <18)

## 2020-10-01 NOTE — ED Notes (Signed)
FIRST NURSE NOTE: Verified with Brenda Osborn pt will need to be seen in ED for elevated D-dimer

## 2020-10-01 NOTE — ED Triage Notes (Addendum)
Reports sent to ED for elevated D-dimer. Pt had labs drawn today, has results of CMP and CBC on phone. D-dimer in chart. Negative for  Strep, flu and COVID.     Reports over last week increased swelling in ankles and hands, chest discomfort while lying down

## 2020-10-01 NOTE — ED Triage Notes (Signed)
FIRST NURSE NOTE:  Pt sent here for elevated D-Dimer

## 2020-10-02 ENCOUNTER — Encounter: Payer: Managed Care, Other (non HMO) | Admitting: Obstetrics and Gynecology

## 2020-10-02 ENCOUNTER — Emergency Department
Admission: EM | Admit: 2020-10-02 | Discharge: 2020-10-02 | Disposition: A | Discharge status: Home / Self Care | Payer: Managed Care, Other (non HMO) | Attending: Emergency Medicine | Admitting: Emergency Medicine

## 2020-10-02 ENCOUNTER — Encounter: Payer: Self-pay | Admitting: Radiology

## 2020-10-02 ENCOUNTER — Emergency Department: Payer: Managed Care, Other (non HMO)

## 2020-10-02 ENCOUNTER — Ambulatory Visit: Payer: Managed Care, Other (non HMO)

## 2020-10-02 DIAGNOSIS — Z3A31 31 weeks gestation of pregnancy: Secondary | ICD-10-CM

## 2020-10-02 DIAGNOSIS — R7989 Other specified abnormal findings of blood chemistry: Secondary | ICD-10-CM

## 2020-10-02 LAB — TROPONIN I (HIGH SENSITIVITY): Troponin I (High Sensitivity): 8 ng/L (ref <18)

## 2020-10-02 MED ORDER — IOHEXOL 350 MG/ML SOLN
75.0000 mL | Freq: Once | INTRAVENOUS | Status: AC | PRN
  Administered 2020-10-02: 75 mL via INTRAVENOUS

## 2020-10-02 MED ORDER — ACETAMINOPHEN 500 MG PO TABS
1000.0000 mg | ORAL_TABLET | Freq: Once | ORAL | Status: AC
  Administered 2020-10-02: 1000 mg via ORAL
  Filled 2020-10-02: qty 2

## 2020-10-02 NOTE — Discharge Instructions (Signed)
As we discussed, you do not have signs of any blood clots in your lungs or your legs. No pneumonias or significant problems in your work-up.  Follow-up with your PCP and gynecologist closely, return to the ED with any acutely worsening symptoms

## 2020-10-02 NOTE — ED Notes (Signed)
Patient in stretcher, VSS, NAD noted. Patient states she is [redacted] weeks pregnant with first child carried to term.  Mother bedside. Call bell within reach.

## 2020-10-02 NOTE — ED Provider Notes (Signed)
[facility] Emergency Department Provider Note ____________________________________________   Event Date/Time   First MD Initiated Contact with Patient 10/02/20 0155     (approximate)  I have reviewed the triage vital signs and the nursing notes.  HISTORY  Chief Complaint Abnormal Lab   HPI Brenda Osborn is a 25 y.o. femalewho presents to the ED for evaluation of chest pain and abnormal outpatient D-dimer.  Chart review indicates history of HTN.  Patient currently about [redacted] weeks gestation.  Recent OB/GYN observation admission on 12/25 for third trimester vaginal bleeding and discharged home.  Prenatal care via [facility].  Patient went to our adjacent walk-in clinic yesterday on 1/2 for a week of cough, congestion and chest pain in the setting of no Covid vaccination.  Swabbed for Covid, this returns negative.  A D-dimer was sent and returns elevated at greater than 1200.  Patient is a G1 at about [redacted] weeks gestation.  She presents with her mother due to being called back from the clinic with elevated D-dimer.  She denies any chest pain, but does report that last week she had some positional chest discomfort and pleuritic discomfort when she was lying down only.  Denies exertional pain.  Denies fever, cough, syncope, leg swelling, emesis, vaginal bleeding or abdominal pain.   Past Medical History:  Diagnosis Date  . Hypertension     Patient Active Problem List   Diagnosis Date Noted  . Third trimester bleeding 09/23/2020  . Obesity affecting pregnancy 08/29/2020  . Pain of round ligament affecting pregnancy, antepartum 08/14/2020  . Back pain affecting pregnancy in second trimester 08/14/2020  . Single umbilical artery [id number]  . High-risk pregnancy, second trimester 07/24/2020  . Chronic hypertension affecting pregnancy 05/26/2020  . Type A blood, Rh positive 05/12/2020  . Susceptible to varicella (non-immune), currently pregnant 05/12/2020   . Hidradenitis suppurativa 10/03/2017  . Headache 03/28/2017    Past Surgical History:  Procedure Laterality Date  . NO PAST SURGERIES      Prior to Admission medications   Medication Sig Start Date End Date Taking? Authorizing Provider  aspirin EC 81 MG tablet Take 1 tablet (81 mg total) by mouth daily. 01/07/20   Lawhorn, Lara Mulch, CNM  metoprolol succinate (TOPROL-XL) 50 MG 24 hr tablet Take by mouth 2 (two) times daily. Take 1 tab BID 09/22/19 09/23/20  [provider]  NIFEdipine (PROCARDIA-XL/NIFEDICAL-XL) 30 MG 24 hr tablet Take 1 tablet (30 mg total) by mouth daily. Can increase to twice a day as needed for symptomatic contractions 07/24/20   Gae Dry, MD  Prenatal Vit-Fe Fumarate-FA (PRENATAL MULTIVITAMIN) TABS tablet Take 1 tablet by mouth daily at 12 noon.    [provider]  Specialty Vitamins Products (MAGNESIUM, AMINO ACID CHELATE,) 133 MG tablet Take 2 tablets by mouth 2 (two) times daily.    [provider]    Allergies Zithromax [azithromycin]  Family History  Problem Relation Age of Onset  . Diabetes Mother        "Pre Diabetes"  . Healthy Sister   . Hypertension Maternal Grandmother   . Healthy Sister   . Breast cancer Neg Hx   . Ovarian cancer Neg Hx   . Colon cancer Neg Hx     Social History Social History   Tobacco Use  . Smoking status: Never Smoker  . Smokeless tobacco: Never Used  Vaping Use  . Vaping Use: Never used  Substance Use Topics  . Alcohol use: No  .  Drug use: No    Review of Systems  Constitutional: No fever/chills Eyes: No visual changes. ENT: No sore throat. Cardiovascular: Positive for chest pain Respiratory: Denies shortness of breath. Gastrointestinal: No abdominal pain.  No nausea, no vomiting.  No diarrhea.  No constipation. Genitourinary: Negative for dysuria. Musculoskeletal: Negative for back pain. Skin: Negative for rash. Neurological: Negative for headaches, focal  weakness or numbness.  ____________________________________________   PHYSICAL EXAM:  VITAL SIGNS: Vitals:   10/02/20 0223 10/02/20 0416  BP: 126/84 99/70  Pulse: 81 95  Resp: 19 18  Temp:    SpO2: 98% 99%      Constitutional: Alert and oriented. Well appearing and in no acute distress. Eyes: Conjunctivae are normal. PERRL. EOMI. Head: Atraumatic. Nose: No congestion/rhinnorhea. Mouth/Throat: Mucous membranes are moist.  Oropharynx non-erythematous. Neck: No stridor. No cervical spine tenderness to palpation. Cardiovascular: Normal rate, regular rhythm. Grossly normal heart sounds.  Good peripheral circulation. Respiratory: Normal respiratory effort.  No retractions. Lungs CTAB. Gastrointestinal: Soft , gravid, nontender to palpation. No CVA tenderness. Musculoskeletal: No lower extremity tenderness nor edema.  No joint effusions. No signs of acute trauma. Neurologic:  Normal speech and language. No gross focal neurologic deficits are appreciated. No gait instability noted. Skin:  Skin is warm, dry and intact. No rash noted. Psychiatric: Mood and affect are normal. Speech and behavior are normal.  ____________________________________________   LABS (all labs ordered are listed, but only abnormal results are displayed)  Labs Reviewed  BASIC METABOLIC PANEL - Abnormal; Notable for the following components:      Result Value   CO2 21 (*)    Calcium 8.7 (*)    All other components within normal limits  CBC - Abnormal; Notable for the following components:   WBC 13.1 (*)    RBC 3.60 (*)    HCT 34.2 (*)    All other components within normal limits  BRAIN NATRIURETIC PEPTIDE  TROPONIN I (HIGH SENSITIVITY)  TROPONIN I (HIGH SENSITIVITY)   ____________________________________________  12 Lead EKG  Rate of 100 bpm.  Normal axis.  Normal intervals.  To inversion to lead III, no further evidence of acute  ischemia. ____________________________________________  RADIOLOGY  ED MD interpretation: 2 view CXR reviewed by me without evidence of acute cardiopulmonary pathology.  Official radiology report(s): DG Chest 2 View  Result Date: 10/01/2020 CLINICAL DATA:  25 year old female with history of chest pain. EXAM: CHEST - 2 VIEW COMPARISON:  Chest x-ray 09/19/2019. FINDINGS: Lung volumes are normal. No consolidative airspace disease. No pleural effusions. No pneumothorax. No pulmonary nodule or mass noted. Pulmonary vasculature and the cardiomediastinal silhouette are within normal limits. IMPRESSION: No radiographic evidence of acute cardiopulmonary disease. Electronically Signed   By: Trudie Reed M.D.   On: 10/01/2020 20:55   CT Angio Chest PE W and/or Wo Contrast  Result Date: 10/02/2020 CLINICAL DATA:  Pleuritic pain and elevated D-dimer in third trimester pregnancy. EXAM: CT ANGIOGRAPHY CHEST WITH CONTRAST TECHNIQUE: Multidetector CT imaging of the chest was performed using the standard protocol during bolus administration of intravenous contrast. Multiplanar CT image reconstructions and MIPs were obtained to evaluate the vascular anatomy. CONTRAST:  32mL OMNIPAQUE IOHEXOL 350 MG/ML SOLN COMPARISON:  None. FINDINGS: Cardiovascular: Satisfactory opacification of the pulmonary arteries to the segmental level. No evidence of pulmonary embolism. Normal heart size. No pericardial effusion. Mediastinum/Nodes: Negative for adenopathy or mass Lungs/Pleura: There is no edema, consolidation, effusion, or pneumothorax. Upper Abdomen: Negative Musculoskeletal: Negative Review of the MIP images confirms the  above findings. IMPRESSION: Negative for pulmonary embolism or other cause of symptoms. Electronically Signed   By: Monte Fantasia M.D.   On: 10/02/2020 04:28   US Venous Img Lower Bilateral (DVT)  Result Date: 10/01/2020 CLINICAL DATA:  Positive D-dimer.  Pain, edema, varicose veins. EXAM: Bilateral LOWER  EXTREMITY VENOUS DOPPLER ULTRASOUND TECHNIQUE: Gray-scale sonography with compression, as well as color and duplex ultrasound, were performed to evaluate the deep venous system(s) from the level of the common femoral vein through the popliteal and proximal calf veins. COMPARISON:  None. FINDINGS: VENOUS Normal compressibility of the bilateral common femoral, superficial femoral, and popliteal veins, as well as the visualized calf veins. Visualized portions of profunda femoral vein and great saphenous vein unremarkable. No filling defects to suggest DVT on grayscale or color Doppler imaging. Doppler waveforms show normal direction of venous flow, normal respiratory plasticity and response to augmentation. OTHER None. Limitations: none IMPRESSION: No femoropopliteal DVT nor evidence of DVT within the visualized calf veins. If clinical symptoms are inconsistent or if there are persistent or worsening symptoms, further imaging (possibly involving the iliac veins) may be warranted. Electronically Signed   By: Lucienne Capers M.D.   On: 10/01/2020 21:49    ____________________________________________   PROCEDURES and INTERVENTIONS  Procedure(s) performed (including Critical Care):  .1-3 Lead EKG Interpretation Performed by: Vladimir Crofts, MD Authorized by: Vladimir Crofts, MD     Interpretation: normal     ECG rate:  90   ECG rate assessment: normal     Rhythm: sinus rhythm     Ectopy: none     Conduction: normal      Medications  iohexol (OMNIPAQUE) 350 MG/ML injection 75 mL (75 mLs Intravenous Contrast Given 10/02/20 0348)  acetaminophen (TYLENOL) tablet 1,000 mg (1,000 mg Oral Given 10/02/20 0412)    ____________________________________________   MDM / ED COURSE   25 year old G1 at [redacted] weeks gestation presents to the ED asymptomatic with an outpatient positive D-dimer, without evidence of acute PE, and amenable to outpatient management. Tachycardic in triage that self resolves, otherwise normal  vitals on room air. Exam demonstrates a gravid patient in no acute distress who does not demonstrate any evidence of acute pathology upon my exam. EKG is nonischemic and blood work is reassuring without evidence of ACS. Outpatient D-dimer reviewed and shared decision making with the patient and her mother yields plan to perform CTA chest to rule out acute PE. This returns negative. Patient asymptomatic and I see no evidence of pathology to preclude outpatient management. Patient medically stable for discharge home.  Clinical Course as of 10/02/20 0457  Mon Oct 02, 2020  0225 Shared decision making with patient and mother, who is at the bedside, yields plan to pursue CTA chest to evaluate for acute PE. [DS]    Clinical Course User Index [DS] Vladimir Crofts, MD    ____________________________________________   FINAL CLINICAL IMPRESSION(S) / ED DIAGNOSES  Final diagnoses:  Positive D dimer  [redacted] weeks gestation of pregnancy     ED Discharge Orders    None        Tamala Julian   Note:  This document was prepared using Dragon voice recognition software and may include unintentional dictation errors.   Vladimir Crofts, MD 10/02/20 [phone]

## 2020-10-05 ENCOUNTER — Telehealth: Payer: Self-pay | Admitting: Obstetrics and Gynecology

## 2020-10-05 NOTE — Telephone Encounter (Signed)
Voicemail is full unable to leave message that the appointment scheduled for 10/11/20 that patient needs to see MD. Appointment time is the same the provider follow up has changed

## 2020-10-05 NOTE — Telephone Encounter (Signed)
-----   Message from Zipporah Plants, [place] sent at 10/04/2020  8:55 PM EST ----- Regarding: Needs to see MD Hey, I was just reviewing this patient's chart- she has several high risk issues and really needs to been seen by an MD at her next visit. Could we get her rescheduled for that? Thanks, Jae Dire

## 2020-10-11 ENCOUNTER — Ambulatory Visit (INDEPENDENT_AMBULATORY_CARE_PROVIDER_SITE_OTHER): Payer: Managed Care, Other (non HMO)

## 2020-10-11 ENCOUNTER — Ambulatory Visit (INDEPENDENT_AMBULATORY_CARE_PROVIDER_SITE_OTHER): Payer: Managed Care, Other (non HMO) | Admitting: Obstetrics and Gynecology

## 2020-10-11 ENCOUNTER — Encounter: Payer: Managed Care, Other (non HMO) | Admitting: Obstetrics and Gynecology

## 2020-10-11 ENCOUNTER — Encounter: Payer: Self-pay | Admitting: Obstetrics and Gynecology

## 2020-10-11 ENCOUNTER — Other Ambulatory Visit: Payer: Self-pay

## 2020-10-11 DIAGNOSIS — O0993 Supervision of high risk pregnancy, unspecified, third trimester: Secondary | ICD-10-CM

## 2020-10-11 DIAGNOSIS — Q270 Congenital absence and hypoplasia of umbilical artery: Secondary | ICD-10-CM | POA: Diagnosis not present

## 2020-10-11 DIAGNOSIS — Z23 Encounter for immunization: Secondary | ICD-10-CM

## 2020-10-11 DIAGNOSIS — O10919 Unspecified pre-existing hypertension complicating pregnancy, unspecified trimester: Secondary | ICD-10-CM | POA: Diagnosis not present

## 2020-10-11 DIAGNOSIS — O0992 Supervision of high risk pregnancy, unspecified, second trimester: Secondary | ICD-10-CM

## 2020-10-11 DIAGNOSIS — O99212 Obesity complicating pregnancy, second trimester: Secondary | ICD-10-CM

## 2020-10-11 LAB — POCT URINALYSIS DIPSTICK OB
Glucose, UA: NEGATIVE
POC,PROTEIN,UA: NEGATIVE

## 2020-10-11 NOTE — Patient Instructions (Addendum)
https://www.nichd.nih.gov/health/topics/labor-delivery/Pages/default.aspx">  Third Trimester of Pregnancy  The third trimester of pregnancy is from week 28 through week 40. This is months 7 through 9. The third trimester is a time when the unborn baby (fetus) is growing rapidly. At the end of the ninth month, the fetus is about 20 inches long and weighs 6-10 pounds. Body changes during your third trimester During the third trimester, your body will continue to go through many changes. The changes vary and generally return to normal after your baby is born. Physical changes  Your weight will continue to increase. You can expect to gain 25-35 pounds (11-16 kg) by the end of the pregnancy if you begin pregnancy at a normal weight. If you are underweight, you can expect to gain 28-40 lb (about 13-18 kg), and if you are overweight, you can expect to gain 15-25 lb (about 7-11 kg).  You may begin to get stretch marks on your hips, abdomen, and breasts.  Your breasts will continue to grow and may hurt. A yellow fluid (colostrum) may leak from your breasts. This is the first milk you are producing for your baby.  You may have changes in your hair. These can include thickening of your hair, rapid growth, and changes in texture. Some people also have hair loss during or after pregnancy, or hair that feels dry or thin.  Your belly button may stick out.  You may notice more swelling in your hands, face, or ankles. Health changes  You may have heartburn.  You may have constipation.  You may develop hemorrhoids.  You may develop swollen, bulging veins (varicose veins) in your legs.  You may have increased body aches in the pelvis, back, or thighs. This is due to weight gain and increased hormones that are relaxing your joints.  You may have increased tingling or numbness in your hands, arms, and legs. The skin on your abdomen may also feel numb.  You may feel short of breath because of your  expanding uterus. Other changes  You may urinate more often because the fetus is moving lower into your pelvis and pressing on your bladder.  You may have more problems sleeping. This may be caused by the size of your abdomen, an increased need to urinate, and an increase in your body's metabolism.  You may notice the fetus "dropping," or moving lower in your abdomen (lightening).  You may have increased vaginal discharge.  You may notice that you have pain around your pelvic bone as your uterus distends. Follow these instructions at home: Medicines  Follow your health care provider's instructions regarding medicine use. Specific medicines may be either safe or unsafe to take during pregnancy. Do not take any medicines unless approved by your health care provider.  Take a prenatal vitamin that contains at least 600 micrograms (mcg) of folic acid. Eating and drinking  Eat a healthy diet that includes fresh fruits and vegetables, whole grains, good sources of protein such as meat, eggs, or tofu, and low-fat dairy products.  Avoid raw meat and unpasteurized juice, milk, and cheese. These carry germs that can harm you and your baby.  Eat 4 or 5 small meals rather than 3 large meals a day.  You may need to take these actions to prevent or treat constipation: ? Drink enough fluid to keep your urine pale yellow. ? Eat foods that are high in fiber, such as beans, whole grains, and fresh fruits and vegetables. ? Limit foods that are high in fat and   processed sugars, such as fried or sweet foods. Activity  Exercise only as directed by your health care provider. Most people can continue their usual exercise routine during pregnancy. Try to exercise for 30 minutes at least 5 days a week. Stop exercising if you experience contractions in the uterus.  Stop exercising if you develop pain or cramping in the lower abdomen or lower back.  Avoid heavy lifting.  Do not exercise if it is very hot or  humid or if you are at a high altitude.  If you choose to, you may continue to have sex unless your health care provider tells you not to. Relieving pain and discomfort  Take frequent breaks and rest with your legs raised (elevated) if you have leg cramps or low back pain.  Take warm sitz baths to soothe any pain or discomfort caused by hemorrhoids. Use hemorrhoid cream if your health care provider approves.  Wear a supportive bra to prevent discomfort from breast tenderness.  If you develop varicose veins: ? Wear support hose as told by your health care provider. ? Elevate your feet for 15 minutes, 3-4 times a day. ? Limit salt in your diet. Safety  Talk to your health care provider before traveling far distances.  Do not use hot tubs, steam rooms, or saunas.  Wear your seat belt at all times when driving or riding in a car.  Talk with your health care provider if someone is verbally or physically abusive to you. Preparing for birth To prepare for the arrival of your baby:  Take prenatal classes to understand, practice, and ask questions about labor and delivery.  Visit the hospital and tour the maternity area.  Purchase a rear-facing car seat and make sure you know how to install it in your car.  Prepare the baby's room or sleeping area. Make sure to remove all pillows and stuffed animals from the baby's crib to prevent suffocation. General instructions  Avoid cat litter boxes and soil used by cats. These carry germs that can cause birth defects in the baby. If you have a cat, ask someone to clean the litter box for you.  Do not douche or use tampons. Do not use scented sanitary pads.  Do not use any products that contain nicotine or tobacco, such as cigarettes, e-cigarettes, and chewing tobacco. If you need help quitting, ask your health care provider.  Do not use any herbal remedies, illegal drugs, or medicines that were not prescribed to you. Chemicals in these products  can harm your baby.  Do not drink alcohol.  You will have more frequent prenatal exams during the third trimester. During a routine prenatal visit, your health care provider will do a physical exam, perform tests, and discuss your overall health. Keep all follow-up visits. This is important. Where to find more information  American Pregnancy Association: americanpregnancy.org  American College of Obstetricians and Gynecologists: acog.org/en/Womens%20Health/Pregnancy  Office on Women's Health: womenshealth.gov/pregnancy Contact a health care provider if you have:  A fever.  Mild pelvic cramps, pelvic pressure, or nagging pain in your abdominal area or lower back.  Vomiting or diarrhea.  Bad-smelling vaginal discharge or foul-smelling urine.  Pain when you urinate.  A headache that does not go away when you take medicine.  Visual changes or see spots in front of your eyes. Get help right away if:  Your water breaks.  You have regular contractions less than 5 minutes apart.  You have spotting or bleeding from your vagina.  You   have severe abdominal pain.  You have difficulty breathing.  You have chest pain.  You have fainting spells.  You have not felt your baby move for the time period told by your health care provider.  You have new or increased pain, swelling, or redness in an arm or leg. Summary  The third trimester of pregnancy is from week 28 through week 40 (months 7 through 9).  You may have more problems sleeping. This can be caused by the size of your abdomen, an increased need to urinate, and an increase in your body's metabolism.  You will have more frequent prenatal exams during the third trimester. Keep all follow-up visits. This is important. This information is not intended to replace advice given to you by your health care provider. Make sure you discuss any questions you have with your health care provider. Document Revised: 02/23/2020 Document  Reviewed: 12/30/2019 Elsevier Patient Education  2021 [employer].   Nonstress Test A nonstress test is a procedure that is done during pregnancy in order to check the baby's heartbeat. The procedure can help to show if the baby (fetus) is healthy. It is commonly done when:  The baby is past his or her due date.  The pregnancy is high risk.  The baby is moving less than normal.  The mother has lost a pregnancy in the past.  The health care provider suspects a problem with the baby's growth.  There is too much or too little amniotic fluid. The procedure is often done in the third trimester of pregnancy to find out if an early delivery is needed and whether such a delivery is safe. During a nonstress test, the baby's heartbeat is monitored when the baby is resting and when the baby is moving. If the baby is healthy, the heart rate will increase when he or she moves or kicks and will return to normal when he or she rests. Tell a health care provider about:  Any allergies you have.  Any medical conditions you have.  All medicines you are taking, including vitamins, herbs, eye drops, creams, and over-the-counter medicines.  Any surgeries you have had.  Any past pregnancies you have had. What are the risks? There are no risks to you or your baby from a nonstress test. This procedure should not be painful or uncomfortable. What happens before the procedure?  Eat a meal right before the test or as directed by your health care provider. Food may help encourage the baby to move.  Use the restroom right before the test. What happens during the procedure?  Two monitors will be placed on your abdomen. One will record the baby's heart rate and the other will record the contractions of your uterus.  You may be asked to lie down on your side or to sit upright.  You may be given a button to press when you feel your baby move.  Your health care provider will listen to your baby's  heartbeat and record it. He or she may also watch your baby's heartbeat on a screen.  If the baby seems to be sleeping, you may be asked to drink some juice or soda, eat a snack, or change positions. The procedure may vary among health care providers and hospitals.   What can I expect after procedure?  Your health care provider will discuss the test results with you and make recommendations for the future. Depending on the results, your health care provider may order additional tests or another course  of action.  If your health care provider gave you any diet or activity instructions, make sure to follow them.  Keep all follow-up visits. This is important. Summary  A nonstress test is a procedure that is done during pregnancy in order to check the baby's heartbeat. The procedure can help show if the baby is healthy.  The procedure is often done in the third trimester of pregnancy to find out if an early delivery is needed and whether such a delivery is safe.  During a nonstress test, the baby's heartbeat is monitored when the baby is resting and when the baby is moving. If the baby is healthy, the heart rate will increase when he or she moves or kicks and will return to normal when he or she rests.  Your health care provider will discuss the test results with you and make recommendations for the future. This information is not intended to replace advice given to you by your health care provider. Make sure you discuss any questions you have with your health care provider. Document Revised: 06/26/2020 Document Reviewed: 06/26/2020 Elsevier Patient Education  2021 [employer].

## 2020-10-11 NOTE — Progress Notes (Signed)
ROB [email or web address]

## 2020-10-11 NOTE — Progress Notes (Signed)
Routine Prenatal Care Visit  Subjective  Brenda Osborn is a 25 y.o. G2P0010 at [redacted]w[redacted]d being seen today for ongoing prenatal care.  She is currently monitored for the following issues for this high-risk pregnancy and has Headache; Hidradenitis suppurativa; Type A blood, Rh positive; Susceptible to varicella (non-immune), currently pregnant; Chronic hypertension affecting pregnancy; High-risk pregnancy, second trimester; Single umbilical artery; Pain of round ligament affecting pregnancy, antepartum; Back pain affecting pregnancy in second trimester; Obesity affecting pregnancy; and Third trimester bleeding on their problem list.  ----------------------------------------------------------------------------------- Patient reports no complaints.   Contractions: Not present. Vag. Bleeding: None.  Movement: Present. Denies leaking of fluid.  ----------------------------------------------------------------------------------- The following portions of the patient's history were reviewed and updated as appropriate: allergies, current medications, past family history, past medical history, past social history, past surgical history and problem list. Problem list updated.   Objective  Blood pressure 116/70, height 5\' 8"  (1.727 m), weight 255 lb 3.2 oz (115.8 kg), last menstrual period 02/28/2020. Pregravid weight 245 lb (111.1 kg) Total Weight Gain 10 lb 3.2 oz (4.627 kg) Urinalysis:      Fetal Status: Fetal Heart Rate (bpm): 140 Fundal Height: 32 cm Movement: Present  Presentation: Vertex  General:  Alert, oriented and cooperative. Patient is in no acute distress.  Skin: Skin is warm and dry. No rash noted.   Cardiovascular: Normal heart rate noted  Respiratory: Normal respiratory effort, no problems with respiration noted  Abdomen: Soft, gravid, appropriate for gestational age. Pain/Pressure: Absent     Pelvic:  Cervical exam deferred        Extremities: Normal range of motion.  Edema: None   Mental Status: Normal mood and affect. Normal behavior. Normal judgment and thought content.     Assessment   25 y.o. G2P0010 at [redacted]w[redacted]d by  12/04/2020, Date entered prior to episode creation presenting for routine prenatal visit  Plan   G1 Problems (from 05/11/20 to present)    Problem Noted Resolved   Obesity affecting pregnancy 08/29/2020 by Will Bonnet, MD No   Pain of round ligament affecting pregnancy, antepartum 08/14/2020 by Rod Can, CNM No   Back pain affecting pregnancy in second trimester 08/14/2020 by Rod Can, CNM No   Single umbilical artery 60/03/3015 by Gae Dry, MD No   Overview Signed 08/08/2020  3:10 PM by Gae Dry, MD    Growth [place] 30 weeks APT weekly 32 weeks IOL 39 weeks      High-risk pregnancy, second trimester 07/24/2020 by Gae Dry, MD No   Overview Addendum 10/11/2020  3:49 PM by Homero Fellers, MD      Nursing Staff Provider  Office Location  [facility] Dating   LMP = 12 wk [place]  Language  English Anatomy US  SUA  Flu Vaccine  10/11/2020 Genetic Screen  NIPS:  Normal xx   TDaP vaccine   10/11/2020 Hgb A1C or  GTT Early : Third trimester : elevated, 3hr nml  Rhogam  Not needed   LAB RESULTS   Feeding Plan  Blood Type A/Positive/-- (08/12 1449)   Contraception  Antibody Negative (08/12 1449)  Circumcision  Rubella 1.66 (08/12 1449)  Pediatrician   RPR Non Reactive (11/30 1136)   Support Person  husband HBsAg Negative (08/12 1449)   Prenatal Classes  HIV Non Reactive (11/30 1136)    Varicella  non-immune   BTL Consent  GBS  (For PCN allergy, check sensitivities)        VBAC Consent  Pap  needed postpartum    Hgb Electro    COVID unvaccinated CF      SMA         High Risk Pregnancy Diagnoses Single umbilical artery Chronic HTN Obesity in pregnancy: Pregravid BMI 37  NST: weekly after 32 weeks Delivery: 37-38 weeks        Previous Version   Chronic hypertension affecting pregnancy 05/26/2020 by  Diona Fanti, CNM No   Overview Signed 07/24/2020  8:46 AM by Gae Dry, MD    [x ] Aspirin 81 mg daily after 12 weeks; discontinue after 36 weeks [x ] baseline labs with CBC, CMP, urine protein/creatinine ratio [ ]  ultrasound for growth at 28, 32, 36 weeks [x ] Baseline EKG   Current antihypertensives:  Metoprolol     Procardia added 07/24/20   Baseline and surveillance labs (pulled in from [facility], refresh links as needed)  Lab Results  Component Value Date   PLT 298 06/21/2020   CREATININE 0.60 06/21/2020   AST 35 06/21/2020   ALT 46 (H) 06/21/2020    Antenatal Testing CHTN - O10.919  Group I  BP < 140/90, no preeclampsia, AGA,  nml AFV, +/- meds    Group II BP > 140/90, on meds, no preeclampsia, AGA, nml AFV  20-28-34-38  20-24-28-32-35-38  32//2 x wk  28//BPP wkly then 32//2 x wk  40 no meds; 39 meds  PRN or 37  Pre-eclampsia  GHTN - O13.9/Preeclampsia without severe features  - O14.00   Preeclampsia with severe features - O14.10  Q 3-4wks  Q 2 wks  28//BPP wkly then 32//2 x wk  Inpatient  37  PRN or 34         Susceptible to varicella (non-immune), currently pregnant 05/12/2020 by Diona Fanti, CNM No   [redacted] weeks gestation of pregnancy 08/14/2020 by Rod Can, [place] 09/14/2020 by Orlie Pollen, CNM     Growth [place] today WNL Flu shot and tdap today Encouraged covid vacciantion  Gestational age appropriate obstetric precautions including but not limited to vaginal bleeding, contractions, leaking of fluid and fetal movement were reviewed in detail with the patient.    Return in about 1 week (around 10/18/2020) for ROB with MD, NST.  Homero Fellers MD [facility], [facility] 10/11/2020, 3:49 PM

## 2020-10-17 ENCOUNTER — Encounter: Payer: Managed Care, Other (non HMO) | Admitting: Obstetrics and Gynecology

## 2020-10-27 ENCOUNTER — Other Ambulatory Visit: Payer: Self-pay

## 2020-10-27 ENCOUNTER — Ambulatory Visit (INDEPENDENT_AMBULATORY_CARE_PROVIDER_SITE_OTHER): Payer: Managed Care, Other (non HMO) | Admitting: Obstetrics & Gynecology

## 2020-10-27 ENCOUNTER — Encounter: Payer: Self-pay | Admitting: Obstetrics & Gynecology

## 2020-10-27 VITALS — BP 118/80 | Wt 255.0 lb

## 2020-10-27 DIAGNOSIS — O99213 Obesity complicating pregnancy, third trimester: Secondary | ICD-10-CM

## 2020-10-27 DIAGNOSIS — Z3A34 34 weeks gestation of pregnancy: Secondary | ICD-10-CM

## 2020-10-27 DIAGNOSIS — O10919 Unspecified pre-existing hypertension complicating pregnancy, unspecified trimester: Secondary | ICD-10-CM

## 2020-10-27 DIAGNOSIS — O0993 Supervision of high risk pregnancy, unspecified, third trimester: Secondary | ICD-10-CM

## 2020-10-27 LAB — POCT URINALYSIS DIPSTICK OB
Glucose, UA: NEGATIVE
POC,PROTEIN,UA: NEGATIVE

## 2020-10-27 NOTE — Progress Notes (Signed)
Subjective  Fetal Movement? yes Contractions? no Leaking Fluid? no Vaginal Bleeding? no Denies ha, blurry vision, CP, SOB, epig pain, edema. Recent Covid infection and pos test, resolving sx's and only cough now  Objective  BP 118/80   Wt 255 lb (115.7 kg)   LMP 02/28/2020 (Approximate)   BMI 38.77 kg/m  General: NAD Pumonary: no increased work of breathing Abdomen: gravid, non-tender Extremities: no edema Psychiatric: mood appropriate, affect full  A NST procedure was performed with FHR monitoring and a normal baseline established, appropriate time of 20-40 minutes of evaluation, and accels >2 seen w 15x15 characteristics.  Results show a REACTIVE NST.   Assessment  25 y.o. G2P0010 at [redacted]w[redacted]d by  12/04/2020, Date entered prior to episode creation presenting for routine prenatal visit  Plan   Problem List Items Addressed This Visit      Cardiovascular and Mediastinum   Chronic hypertension affecting pregnancy   Relevant Orders   Biophysical profile   US OB Limited     Other   Obesity affecting pregnancy    Other Visit Diagnoses    [redacted] weeks gestation of pregnancy    -  Primary   Relevant Orders   POC Urinalysis Dipstick OB (Completed)   High-risk pregnancy, third trimester          G1 Problems (from 05/11/20 to present)    Problem Noted Resolved   Obesity affecting pregnancy     Pain of round ligament affecting pregnancy, antepartum     Back pain affecting pregnancy in second trimester     Single umbilical artery     Overview Signed 08/08/2020  3:10 PM by Gae Dry, MD    Growth [place] 30 weeks APT weekly 32 weeks IOL 39 weeks      High-risk pregnancy, second trimester     Overview Addendum 10/11/2020  3:49 PM by Homero Fellers, MD      Nursing Staff Provider  Office Location  [facility] Dating   LMP = 12 wk [place]  Language  English Anatomy US  SUA  Flu Vaccine  10/11/2020 Genetic Screen  NIPS:  Normal xx   TDaP vaccine   10/11/2020 Hgb A1C or  GTT  Early : Third trimester : elevated, 3hr nml  Rhogam  Not needed   LAB RESULTS   Feeding Plan  Blood Type A/Positive/-- (08/12 1449)   Contraception  Antibody Negative (08/12 1449)  Circumcision  Rubella 1.66 (08/12 1449)  Pediatrician   RPR Non Reactive (11/30 1136)   Support Person  husband HBsAg Negative (08/12 1449)   Prenatal Classes  HIV Non Reactive (11/30 1136)    Varicella  non-immune   BTL Consent  GBS  (For PCN allergy, check sensitivities)        VBAC Consent  Pap  needed postpartum    Hgb Electro    COVID unvaccinated CF      SMA         High Risk Pregnancy Diagnoses Single umbilical artery Chronic HTN Obesity in pregnancy: Pregravid BMI 37  NST/BPP: weekly after 32 weeks Delivery: 37-38 weeks     Chronic hypertension affecting pregnancy     Overview Signed 07/24/2020  8:46 AM by Gae Dry, MD    [x ] Aspirin 81 mg daily after 12 weeks; discontinue after 36 weeks [x ] baseline labs with CBC, CMP, urine protein/creatinine ratio [ ]  ultrasound for growth at 28, 32, 36 weeks [x ] Baseline EKG   Current antihypertensives:  Metoprolol  Procardia added 07/24/20   Baseline and surveillance labs (pulled in from [facility], refresh links as needed)  Lab Results  Component Value Date   PLT 298 06/21/2020   CREATININE 0.60 06/21/2020   AST 35 06/21/2020   ALT 46 (H) 06/21/2020    Antenatal Testing CHTN - O10.919  Group I  BP < 140/90, no preeclampsia, AGA,  nml AFV, +/- meds    Group II BP > 140/90, on meds, no preeclampsia, AGA, nml AFV  20-28-34-38  20-24-28-32-35-38  32//2 x wk  28//BPP wkly then 32//2 x wk  40 no meds; 39 meds  PRN or 37  Pre-eclampsia  GHTN - O13.9/Preeclampsia without severe features  - O14.00   Preeclampsia with severe features - O14.10  Q 3-4wks  Q 2 wks  28//BPP wkly then 32//2 x wk  Inpatient  37  PRN or 34         Plan- weekly BPP, delivery 38 weeks     Sooner delivery if BP worsen or concerns w APT  Barnett Applebaum, MD, [facility], [facility] 10/27/2020  10:13 AM

## 2020-11-01 ENCOUNTER — Other Ambulatory Visit: Payer: Self-pay | Admitting: Obstetrics & Gynecology

## 2020-11-03 ENCOUNTER — Encounter: Payer: Self-pay | Admitting: Obstetrics & Gynecology

## 2020-11-03 ENCOUNTER — Ambulatory Visit (INDEPENDENT_AMBULATORY_CARE_PROVIDER_SITE_OTHER): Payer: Managed Care, Other (non HMO) | Admitting: Obstetrics & Gynecology

## 2020-11-03 ENCOUNTER — Other Ambulatory Visit: Payer: Self-pay | Admitting: Obstetrics & Gynecology

## 2020-11-03 ENCOUNTER — Ambulatory Visit (INDEPENDENT_AMBULATORY_CARE_PROVIDER_SITE_OTHER): Payer: Managed Care, Other (non HMO)

## 2020-11-03 ENCOUNTER — Other Ambulatory Visit: Payer: Self-pay

## 2020-11-03 VITALS — BP 128/80 | Wt 258.0 lb

## 2020-11-03 DIAGNOSIS — Q270 Congenital absence and hypoplasia of umbilical artery: Secondary | ICD-10-CM

## 2020-11-03 DIAGNOSIS — O0993 Supervision of high risk pregnancy, unspecified, third trimester: Secondary | ICD-10-CM | POA: Diagnosis not present

## 2020-11-03 DIAGNOSIS — Z3A35 35 weeks gestation of pregnancy: Secondary | ICD-10-CM

## 2020-11-03 DIAGNOSIS — O10919 Unspecified pre-existing hypertension complicating pregnancy, unspecified trimester: Secondary | ICD-10-CM

## 2020-11-03 DIAGNOSIS — O99213 Obesity complicating pregnancy, third trimester: Secondary | ICD-10-CM | POA: Diagnosis not present

## 2020-11-03 LAB — POCT URINALYSIS DIPSTICK OB
Glucose, UA: NEGATIVE
POC,PROTEIN,UA: NEGATIVE

## 2020-11-03 NOTE — Progress Notes (Signed)
Subjective  Fetal Movement? yes Contractions? no Leaking Fluid? no Vaginal Bleeding? no No s/sx preeclampsia Objective  BP 128/80   Wt 258 lb (117 kg)   LMP 02/28/2020 (Approximate)   BMI 39.23 kg/m  General: NAD Pumonary: no increased work of breathing Abdomen: gravid, non-tender Extremities: no edema Psychiatric: mood appropriate, affect full  A NST procedure was performed with FHR monitoring and a normal baseline established, appropriate time of 20-40 minutes of evaluation, and accels >2 seen w 15x15 characteristics.  Results show a REACTIVE NST.   Review of ULTRASOUND.    I have personally reviewed images and report of recent ultrasound done at [facility]    Plan of management to be discussed with patient.    BPP 8/8  Assessment  25 y.o. G2P0010 at [redacted]w[redacted]d by  12/04/2020, Date entered prior to episode creation presenting for routine prenatal visit  Plan   Problem List Items Addressed This Visit      Cardiovascular and Mediastinum   Chronic hypertension affecting pregnancy - Primary   Single umbilical artery   Obesity affecting pregnancy   High-risk pregnancy, third trimester       Relevant Orders   US FETAL BPP W/NONSTRESS TEST WEEKLY PNV, [facility] PTL Precautions discussed         Nursing Staff Provider  Office Location  [facility] Dating   LMP = 12 wk [place]  Language  English Anatomy US  SUA  Flu Vaccine  10/11/2020 Genetic Screen  NIPS:  Normal xx   TDaP vaccine   10/11/2020 Hgb A1C or  GTT Early : Third trimester : elevated, 3hr nml  Rhogam  Not needed   LAB RESULTS   Feeding Plan  Blood Type A/Positive/-- (08/12 1449)   Contraception  Antibody Negative (08/12 1449)  Circumcision  Rubella 1.66 (08/12 1449)  Pediatrician   RPR Non Reactive (11/30 1136)   Support Person  husband HBsAg Negative (08/12 1449)   Prenatal Classes  HIV Non Reactive (11/30 1136)    Varicella  non-immune   BTL Consent  GBS  (For PCN allergy, check sensitivities)        VBAC Consent  Pap   needed postpartum    Hgb Electro    COVID unvaccinated CF      SMA         High Risk Pregnancy Diagnoses Single umbilical artery Chronic HTN Obesity in pregnancy: Pregravid BMI 37  NST: weekly after 32 weeks Delivery: 37-38 weeks        Previous Version   Chronic hypertension affecting pregnancy 05/26/2020 by Diona Fanti, CNM No   Overview Signed 07/24/2020  8:46 AM by Gae Dry, MD    [x ] Aspirin 81 mg daily after 12 weeks; discontinue after 36 weeks [x ] baseline labs with CBC, CMP, urine protein/creatinine ratio [ ]  ultrasound for growth at 28, 32, 36 weeks [x ] Baseline EKG   Current antihypertensives:  Metoprolol     Procardia added 07/24/20   Baseline and surveillance labs (pulled in from [facility], refresh links as needed)  Lab Results  Component Value Date   PLT 298 06/21/2020   CREATININE 0.60 06/21/2020   AST 35 06/21/2020   ALT 46 (H) 06/21/2020    Antenatal Testing CHTN - O10.919  Group I  BP < 140/90, no preeclampsia, AGA,  nml AFV, +/- meds    Group II BP > 140/90, on meds, no preeclampsia, AGA, nml AFV  20-28-34-38  20-24-28-32-35-38  32//2 x wk  28//BPP  wkly then 32//2 x wk  40 no meds; 39 meds  PRN or 37  Pre-eclampsia  GHTN - O13.9/Preeclampsia without severe features  - O14.00   Preeclampsia with severe features - O14.10  Q 3-4wks  Q 2 wks  28//BPP wkly then 32//2 x wk  Inpatient  37  PRN or 34           Barnett Applebaum, MD, [place], [facility] 11/03/2020  2:16 PM

## 2020-11-03 NOTE — Addendum Note (Signed)
Addended by: Quintella Baton D on: 11/03/2020 02:22 PM   Modules accepted: Orders

## 2020-11-08 ENCOUNTER — Other Ambulatory Visit: Payer: Self-pay

## 2020-11-08 ENCOUNTER — Ambulatory Visit (INDEPENDENT_AMBULATORY_CARE_PROVIDER_SITE_OTHER): Payer: Managed Care, Other (non HMO)

## 2020-11-08 ENCOUNTER — Encounter: Payer: Self-pay | Admitting: Obstetrics and Gynecology

## 2020-11-08 ENCOUNTER — Ambulatory Visit (INDEPENDENT_AMBULATORY_CARE_PROVIDER_SITE_OTHER): Payer: Managed Care, Other (non HMO) | Admitting: Obstetrics and Gynecology

## 2020-11-08 VITALS — BP 126/72 | Ht 68.0 in | Wt 261.4 lb

## 2020-11-08 DIAGNOSIS — O10919 Unspecified pre-existing hypertension complicating pregnancy, unspecified trimester: Secondary | ICD-10-CM | POA: Diagnosis not present

## 2020-11-08 DIAGNOSIS — Z3A36 36 weeks gestation of pregnancy: Secondary | ICD-10-CM | POA: Diagnosis not present

## 2020-11-08 DIAGNOSIS — Q270 Congenital absence and hypoplasia of umbilical artery: Secondary | ICD-10-CM | POA: Diagnosis not present

## 2020-11-08 DIAGNOSIS — O0993 Supervision of high risk pregnancy, unspecified, third trimester: Secondary | ICD-10-CM

## 2020-11-08 DIAGNOSIS — O99213 Obesity complicating pregnancy, third trimester: Secondary | ICD-10-CM | POA: Diagnosis not present

## 2020-11-08 DIAGNOSIS — O0992 Supervision of high risk pregnancy, unspecified, second trimester: Secondary | ICD-10-CM

## 2020-11-08 LAB — FETAL NONSTRESS TEST

## 2020-11-08 LAB — POCT URINALYSIS DIPSTICK OB
Glucose, UA: NEGATIVE
POC,PROTEIN,UA: NEGATIVE

## 2020-11-08 NOTE — Progress Notes (Signed)
Routine Prenatal Care Visit  Subjective  Brenda Osborn is a 25 y.o. G2P0010 at [redacted]w[redacted]d being seen today for ongoing prenatal care.  She is currently monitored for the following issues for this high-risk pregnancy and has Headache; Hidradenitis suppurativa; Type A blood, Rh positive; Susceptible to varicella (non-immune), currently pregnant; Chronic hypertension affecting pregnancy; High-risk pregnancy, second trimester; Single umbilical artery; Pain of round ligament affecting pregnancy, antepartum; Back pain affecting pregnancy in second trimester; Obesity affecting pregnancy; and Third trimester bleeding on their problem list.  ----------------------------------------------------------------------------------- Patient reports no complaints.   Contractions: Irritability. Vag. Bleeding: None.  Movement: Present. Denies leaking of fluid.  ----------------------------------------------------------------------------------- The following portions of the patient's history were reviewed and updated as appropriate: allergies, current medications, past family history, past medical history, past social history, past surgical history and problem list. Problem list updated.   Objective  Blood pressure 126/72, height 5\' 8"  (1.727 m), weight 261 lb 6.4 oz (118.6 kg), last menstrual period 02/28/2020. Pregravid weight 245 lb (111.1 kg) Total Weight Gain 16 lb 6.4 oz (7.439 kg) Urinalysis:      Fetal Status: Fetal Heart Rate (bpm): 140   Movement: Present     General:  Alert, oriented and cooperative. Patient is in no acute distress.  Skin: Skin is warm and dry. No rash noted.   Cardiovascular: Normal heart rate noted  Respiratory: Normal respiratory effort, no problems with respiration noted  Abdomen: Soft, gravid, appropriate for gestational age. Pain/Pressure: Absent     Pelvic:  Cervical exam performed        Extremities: Normal range of motion.  Edema: None  Mental Status: Normal mood and  affect. Normal behavior. Normal judgment and thought content.     Assessment   25 y.o. G2P0010 at [redacted]w[redacted]d by  12/04/2020, Date entered prior to episode creation presenting for routine prenatal visit  Plan   G1 Problems (from 05/11/20 to present)    Problem Noted Resolved   Obesity affecting pregnancy 08/29/2020 by Will Bonnet, MD No   Pain of round ligament affecting pregnancy, antepartum 08/14/2020 by Rod Can, CNM No   Back pain affecting pregnancy in second trimester 08/14/2020 by Rod Can, CNM No   Single umbilical artery 24/0/9735 by Gae Dry, MD No   Overview Signed 08/08/2020  3:10 PM by Gae Dry, MD    Growth [place] 30 weeks APT weekly 32 weeks IOL 39 weeks      High-risk pregnancy, second trimester 07/24/2020 by Gae Dry, MD No   Overview Addendum 10/11/2020  3:49 PM by Homero Fellers, MD      Nursing Staff Provider  Office Location  [facility] Dating   LMP = 12 wk [place]  Language  English Anatomy US  SUA  Flu Vaccine  10/11/2020 Genetic Screen  NIPS:  Normal xx   TDaP vaccine   10/11/2020 Hgb A1C or  GTT Early : Third trimester : elevated, 3hr nml  Rhogam  Not needed   LAB RESULTS   Feeding Plan  Blood Type A/Positive/-- (08/12 1449)   Contraception  Antibody Negative (08/12 1449)  Circumcision  Rubella 1.66 (08/12 1449)  Pediatrician   RPR Non Reactive (11/30 1136)   Support Person  husband HBsAg Negative (08/12 1449)   Prenatal Classes  HIV Non Reactive (11/30 1136)    Varicella  non-immune   BTL Consent  GBS  (For PCN allergy, check sensitivities)        VBAC Consent  Pap  needed postpartum    Hgb Electro    COVID unvaccinated CF      SMA         High Risk Pregnancy Diagnoses Single umbilical artery Chronic HTN Obesity in pregnancy: Pregravid BMI 37  NST: weekly after 32 weeks Delivery: 37-38 weeks        Previous Version   Chronic hypertension affecting pregnancy 05/26/2020 by Diona Fanti, CNM No    Overview Signed 07/24/2020  8:46 AM by Gae Dry, MD    [x ] Aspirin 81 mg daily after 12 weeks; discontinue after 36 weeks [x ] baseline labs with CBC, CMP, urine protein/creatinine ratio [ ]  ultrasound for growth at 28, 32, 36 weeks [x ] Baseline EKG   Current antihypertensives:  Metoprolol     Procardia added 07/24/20   Baseline and surveillance labs (pulled in from [facility], refresh links as needed)  Lab Results  Component Value Date   PLT 298 06/21/2020   CREATININE 0.60 06/21/2020   AST 35 06/21/2020   ALT 46 (H) 06/21/2020    Antenatal Testing CHTN - O10.919  Group I  BP < 140/90, no preeclampsia, AGA,  nml AFV, +/- meds    Group II BP > 140/90, on meds, no preeclampsia, AGA, nml AFV  20-28-34-38  20-24-28-32-35-38  32//2 x wk  28//BPP wkly then 32//2 x wk  40 no meds; 39 meds  PRN or 37  Pre-eclampsia  GHTN - O13.9/Preeclampsia without severe features  - O14.00   Preeclampsia with severe features - O14.10  Q 3-4wks  Q 2 wks  28//BPP wkly then 32//2 x wk  Inpatient  37  PRN or 34         Susceptible to varicella (non-immune), currently pregnant 05/12/2020 by Diona Fanti, CNM No   [redacted] weeks gestation of pregnancy 08/14/2020 by Rod Can, CNM 09/14/2020 by Orlie Pollen, CNM       IOL scheduled for 11/19/2020 at 8am NST: 135 bpm baseline, moderate variability, 15x15 accelerations, no decelerations. Reactive  Gestational age appropriate obstetric precautions including but not limited to vaginal bleeding, contractions, leaking of fluid and fetal movement were reviewed in detail with the patient.    Return in about 1 week (around 11/15/2020) for Growth [place], AFI, NST, ROB with MD in person.  Homero Fellers MD [facility], [facility] 11/08/2020, 3:03 PM

## 2020-11-08 NOTE — Patient Instructions (Signed)
Covid Testing on 11/17/2020 between 8-10:30 am, Drive up testing in front of the [employer]. Follow signs for Pre-admission testing. Please wear a mask.  Induction 11/19/2020 at 8 am . Enter through the [facility] for an 8 AM induction. Please enter through the ER for a midnight or 5 AM induction.  Please eat a meal prior to your arrival.     Labor Induction Labor induction is when steps are taken to cause a pregnant woman to begin the labor process. Most women go into labor on their own between 37 weeks and 42 weeks of pregnancy. When this does not happen, or when there is a medical need for labor to begin, steps may be taken to induce, or bring on, labor. Labor induction causes a pregnant woman's uterus to contract. It also causes the cervix to soften (ripen), open (dilate), and thin out. Usually, labor is not induced before 39 weeks of pregnancy unless there is a medical reason to do so. When is labor induction considered? Labor induction may be right for you if:  Your pregnancy lasts longer than 41 to 42 weeks.  Your placenta is separating from your uterus (placental abruption).  You have a rupture of membranes and your labor does not begin.  You have health problems, like diabetes or high blood pressure (preeclampsia) during your pregnancy.  Your baby has stopped growing or does not have enough amniotic fluid. Before labor induction begins, your health care provider will consider the following factors:  Your medical condition and the baby's condition.  How many weeks you have been pregnant.  How mature the baby's lungs are.  The condition of your cervix.  The position of the baby.  The size of your birth canal. Tell a health care provider about:  Any allergies you have.  All medicines you are taking, including vitamins, herbs, eye drops, creams, and over-the-counter medicines.  Any problems you or your family members have had with anesthetic medicines.  Any  surgeries you have had.  Any blood disorders you have.  Any medical conditions you have. What are the risks? Generally, this is a safe procedure. However, problems may occur, including:  Failed induction.  Changes in fetal heart rate, such as being too high, too low, or irregular (erratic).  Infection in the mother or the baby.  Increased risk of having a cesarean delivery.  Breaking off (abruption) of the placenta from the uterus. This is rare.  Rupture of the uterus. This is very rare.  Your baby could fail to get enough blood flow or oxygen. This can be life-threatening. When induction is needed for medical reasons, the benefits generally outweigh the risks. What happens during the procedure? During the procedure, your health care provider will use one of these methods to induce labor:  Stripping the membranes. In this method, the amniotic sac tissue is gently separated from the cervix. This causes the following to happen: ? Your cervix stretches, which in turn causes the release of prostaglandins. ? Prostaglandins induce labor and cause the uterus to contract. ? This procedure is often done in an office visit. You will be sent home to wait for contractions to begin.  Prostaglandin medicine. This medicine starts contractions and causes the cervix to dilate and ripen. This can be taken by mouth (orally) or by being inserted into the vagina (suppository).  Inserting a small, thin tube (catheter) with a balloon into the vagina and then expanding the balloon with water to dilate the cervix.  Breaking the water. In this method, a small instrument is used to make a small hole in the amniotic sac. This eventually causes the amniotic sac to break. Contractions should begin within a few hours.  Medicine to trigger or strengthen contractions. This medicine is given through an IV that is inserted into a vein in your arm. This procedure may vary among health care providers and hospitals.    Where to find more information  [employer]: www.marchofdimes.org  [employer] of [employer]st[employer]ians and [employer]necologists: www.acog.org Summary  Labor induction causes a pregnant woman's uterus to contract. It also causes the cervix to soften (ripen), open (dilate), and thin out.  Labor is usually not induced before 39 weeks of pregnancy unless there is a medical reason to do so.  When induction is needed for medical reasons, the benefits generally outweigh the risks.  Talk with your health care provider about which methods of labor induction are right for you. This information is not intended to replace advice given to you by your health care provider. Make sure you discuss any questions you have with your health care provider. Document Revised: 06/29/2020 Document Reviewed: 06/29/2020 Elsevier Patient Education  [place].

## 2020-11-13 LAB — CULTURE, BETA STREP (GROUP B ONLY): Strep Gp B Culture: NEGATIVE

## 2020-11-16 ENCOUNTER — Ambulatory Visit: Payer: Managed Care, Other (non HMO)

## 2020-11-16 ENCOUNTER — Encounter: Payer: Self-pay | Admitting: Obstetrics and Gynecology

## 2020-11-16 ENCOUNTER — Other Ambulatory Visit: Payer: Self-pay

## 2020-11-16 ENCOUNTER — Ambulatory Visit (INDEPENDENT_AMBULATORY_CARE_PROVIDER_SITE_OTHER): Payer: Managed Care, Other (non HMO) | Admitting: Obstetrics and Gynecology

## 2020-11-16 VITALS — BP 125/83 | Wt 262.0 lb

## 2020-11-16 DIAGNOSIS — O10919 Unspecified pre-existing hypertension complicating pregnancy, unspecified trimester: Secondary | ICD-10-CM | POA: Diagnosis not present

## 2020-11-16 DIAGNOSIS — O09293 Supervision of pregnancy with other poor reproductive or obstetric history, third trimester: Secondary | ICD-10-CM

## 2020-11-16 DIAGNOSIS — O09899 Supervision of other high risk pregnancies, unspecified trimester: Secondary | ICD-10-CM

## 2020-11-16 DIAGNOSIS — O99213 Obesity complicating pregnancy, third trimester: Secondary | ICD-10-CM

## 2020-11-16 DIAGNOSIS — Z3A37 37 weeks gestation of pregnancy: Secondary | ICD-10-CM

## 2020-11-16 DIAGNOSIS — O0993 Supervision of high risk pregnancy, unspecified, third trimester: Secondary | ICD-10-CM

## 2020-11-16 DIAGNOSIS — Q270 Congenital absence and hypoplasia of umbilical artery: Secondary | ICD-10-CM | POA: Diagnosis not present

## 2020-11-16 DIAGNOSIS — Z283 Underimmunization status: Secondary | ICD-10-CM

## 2020-11-16 LAB — POCT URINALYSIS DIPSTICK OB
Glucose, UA: NEGATIVE
POC,PROTEIN,UA: NEGATIVE

## 2020-11-16 NOTE — Progress Notes (Signed)
Routine Prenatal Care Visit  Subjective  Brenda Osborn is a 25 y.o. G2P0010 at [redacted]w[redacted]d being seen today for ongoing prenatal care.  She is currently monitored for the following issues for this high-risk pregnancy and has Headache; Hidradenitis suppurativa; Type A blood, Rh positive; Susceptible to varicella (non-immune), currently pregnant; Chronic hypertension affecting pregnancy; High-risk pregnancy, second trimester; Single umbilical artery; Pain of round ligament affecting pregnancy, antepartum; Back pain affecting pregnancy in second trimester; Obesity affecting pregnancy; and Third trimester bleeding on their problem list.  ----------------------------------------------------------------------------------- Patient reports no complaints.   Contractions: Irregular. Vag. Bleeding: None.  Movement: Present. Leaking Fluid denies.  ----------------------------------------------------------------------------------- The following portions of the patient's history were reviewed and updated as appropriate: allergies, current medications, past family history, past medical history, past social history, past surgical history and problem list. Problem list updated.  Objective  Blood pressure 125/83, last menstrual period 02/28/2020. Pregravid weight 245 lb (111.1 kg) Total Weight Gain 16 lb 6.4 oz (7.439 kg) Urinalysis: Urine Protein Negative  Urine Glucose Negative  Fetal Status: Fetal Heart Rate (bpm): 140   Movement: Present  Presentation: Vertex  General:  Alert, oriented and cooperative. Patient is in no acute distress.  Skin: Skin is warm and dry. No rash noted.   Cardiovascular: Normal heart rate noted  Respiratory: Normal respiratory effort, no problems with respiration noted  Abdomen: Soft, gravid, appropriate for gestational age. Pain/Pressure: Present     Pelvic:  Cervical exam deferred        Extremities: Normal range of motion.  Edema: None  Mental Status: Normal mood and affect.  Normal behavior. Normal judgment and thought content.   NST: Baseline FHR: 140 beats/min Variability: moderate Accelerations: present Decelerations: absent Tocometry: not done  Interpretation:  INDICATIONS: chronic hypertension and single umbilical vessel RESULTS:  A NST procedure was performed with FHR monitoring and a normal baseline established, appropriate time of 20-40 minutes of evaluation, and accels >2 seen w 15x15 characteristics.  Results show a REACTIVE NST.    Bedside u/s, transabdominal: fetus is cephalic. MVP is 4.5 cm.  Movement noted.  No growth performed today.    Assessment   25 y.o. G2P0010 at [redacted]w[redacted]d by  12/04/2020, Date entered prior to episode creation presenting for routine prenatal visit  Plan   G1 Problems (from 05/11/20 to present)    Problem Noted Resolved   Obesity affecting pregnancy 08/29/2020 by Will Bonnet, MD No   Pain of round ligament affecting pregnancy, antepartum 08/14/2020 by Rod Can, CNM No   Back pain affecting pregnancy in second trimester 08/14/2020 by Rod Can, CNM No   Single umbilical artery 32/12/4008 by Gae Dry, MD No   Overview Signed 08/08/2020  3:10 PM by Gae Dry, MD    Growth [place] 30 weeks APT weekly 32 weeks IOL 39 weeks      High-risk pregnancy, second trimester 07/24/2020 by Gae Dry, MD No   Overview Addendum 11/08/2020  3:49 PM by Homero Fellers, MD      Nursing Staff Provider  Office Location  [facility] Dating   LMP = 12 wk [place]  Language  English Anatomy US  SUA  Flu Vaccine  10/11/2020 Genetic Screen  NIPS:  Normal xx   TDaP vaccine   10/11/2020 Hgb A1C or  GTT  Third trimester : elevated, 3hr nml  Rhogam  Not needed   LAB RESULTS   Feeding Plan  Blood Type A/Positive/-- (08/12 1449)   Contraception  Antibody Negative (08/12  1449)  Circumcision  Rubella 1.66 (08/12 1449)  Pediatrician   RPR Non Reactive (11/30 1136)   Support Person  husband HBsAg Negative (08/12 1449)    Prenatal Classes  HIV Non Reactive (11/30 1136)    Varicella  non-immune   BTL Consent  GBS  (For PCN allergy, check sensitivities)        VBAC Consent  Pap  needed postpartum    Hgb Electro    COVID unvaccinated CF      SMA         High Risk Pregnancy Diagnoses Single umbilical artery Chronic HTN Obesity in pregnancy: Pregravid BMI 37 Covid during pregnancy Jan 2022  NST: weekly after 32 weeks Delivery: 37-38 weeks        Previous Version   Chronic hypertension affecting pregnancy 05/26/2020 by Diona Fanti, CNM No   Overview Signed 07/24/2020  8:46 AM by Gae Dry, MD    [x ] Aspirin 81 mg daily after 12 weeks; discontinue after 36 weeks [x ] baseline labs with CBC, CMP, urine protein/creatinine ratio [ ]  ultrasound for growth at 28, 32, 36 weeks [x ] Baseline EKG   Current antihypertensives:  Metoprolol     Procardia added 07/24/20   Baseline and surveillance labs (pulled in from [facility], refresh links as needed)  Lab Results  Component Value Date   PLT 298 06/21/2020   CREATININE 0.60 06/21/2020   AST 35 06/21/2020   ALT 46 (H) 06/21/2020    Antenatal Testing CHTN - O10.919  Group I  BP < 140/90, no preeclampsia, AGA,  nml AFV, +/- meds    Group II BP > 140/90, on meds, no preeclampsia, AGA, nml AFV  20-28-34-38  20-24-28-32-35-38  32//2 x wk  28//BPP wkly then 32//2 x wk  40 no meds; 39 meds  PRN or 37  Pre-eclampsia  GHTN - O13.9/Preeclampsia without severe features  - O14.00   Preeclampsia with severe features - O14.10  Q 3-4wks  Q 2 wks  28//BPP wkly then 32//2 x wk  Inpatient  37  PRN or 34         Susceptible to varicella (non-immune), currently pregnant 05/12/2020 by Diona Fanti, CNM No   [redacted] weeks gestation of pregnancy 08/14/2020 by Rod Can, CNM 09/14/2020 by Orlie Pollen, CNM       Term labor symptoms and general obstetric precautions including but not limited to vaginal bleeding,  contractions, leaking of fluid and fetal movement were reviewed in detail with the patient. Please refer to After Visit Summary for other counseling recommendations.   - no growth today due to no available sonographer.  Other sonographic findings are reassuring.  Will defer and have IOL in 3 days.   - declines cervical check today.   Return in 3 days (on 11/19/2020) for Keep IOL previously scheduled.   Prentice Docker, MD, [facility], [facility] 11/16/2020 2:41 PM

## 2020-11-19 ENCOUNTER — Inpatient Hospital Stay
Admission: RE | Admit: 2020-11-19 | Discharge: 2020-11-21 | DRG: 806 | Disposition: A | Discharge status: Home / Self Care | Payer: Managed Care, Other (non HMO) | Attending: Obstetrics and Gynecology | Admitting: Obstetrics and Gynecology

## 2020-11-19 ENCOUNTER — Encounter: Payer: Self-pay | Admitting: Obstetrics and Gynecology

## 2020-11-19 ENCOUNTER — Inpatient Hospital Stay: Payer: Managed Care, Other (non HMO) | Admitting: Anesthesiology

## 2020-11-19 ENCOUNTER — Other Ambulatory Visit: Payer: Self-pay

## 2020-11-19 DIAGNOSIS — D62 Acute posthemorrhagic anemia: Secondary | ICD-10-CM | POA: Diagnosis not present

## 2020-11-19 DIAGNOSIS — Z881 Allergy status to other antibiotic agents status: Secondary | ICD-10-CM

## 2020-11-19 DIAGNOSIS — O1002 Pre-existing essential hypertension complicating childbirth: Principal | ICD-10-CM | POA: Diagnosis present

## 2020-11-19 DIAGNOSIS — Z7982 Long term (current) use of aspirin: Secondary | ICD-10-CM | POA: Diagnosis not present

## 2020-11-19 DIAGNOSIS — O9081 Anemia of the puerperium: Secondary | ICD-10-CM | POA: Diagnosis not present

## 2020-11-19 DIAGNOSIS — O1092 Unspecified pre-existing hypertension complicating childbirth: Secondary | ICD-10-CM | POA: Diagnosis not present

## 2020-11-19 DIAGNOSIS — O99214 Obesity complicating childbirth: Secondary | ICD-10-CM | POA: Diagnosis present

## 2020-11-19 DIAGNOSIS — Z3A37 37 weeks gestation of pregnancy: Secondary | ICD-10-CM | POA: Diagnosis not present

## 2020-11-19 DIAGNOSIS — O99891 Other specified diseases and conditions complicating pregnancy: Secondary | ICD-10-CM

## 2020-11-19 DIAGNOSIS — O09899 Supervision of other high risk pregnancies, unspecified trimester: Secondary | ICD-10-CM

## 2020-11-19 DIAGNOSIS — Q270 Congenital absence and hypoplasia of umbilical artery: Secondary | ICD-10-CM

## 2020-11-19 DIAGNOSIS — O26899 Other specified pregnancy related conditions, unspecified trimester: Secondary | ICD-10-CM

## 2020-11-19 DIAGNOSIS — O99213 Obesity complicating pregnancy, third trimester: Secondary | ICD-10-CM

## 2020-11-19 DIAGNOSIS — R102 Pelvic and perineal pain unspecified side: Secondary | ICD-10-CM

## 2020-11-19 DIAGNOSIS — O0993 Supervision of high risk pregnancy, unspecified, third trimester: Secondary | ICD-10-CM

## 2020-11-19 DIAGNOSIS — Z3490 Encounter for supervision of normal pregnancy, unspecified, unspecified trimester: Secondary | ICD-10-CM | POA: Diagnosis present

## 2020-11-19 DIAGNOSIS — O10919 Unspecified pre-existing hypertension complicating pregnancy, unspecified trimester: Secondary | ICD-10-CM | POA: Diagnosis present

## 2020-11-19 DIAGNOSIS — M549 Dorsalgia, unspecified: Secondary | ICD-10-CM

## 2020-11-19 LAB — COMPREHENSIVE METABOLIC PANEL
ALT: 16 U/L (ref 0–44)
AST: 28 U/L (ref 15–41)
Albumin: 3 g/dL — ABNORMAL LOW (ref 3.5–5.0)
Alkaline Phosphatase: 118 U/L (ref 38–126)
Anion gap: 11 (ref 5–15)
BUN: 10 mg/dL (ref 6–20)
CO2: 18 mmol/L — ABNORMAL LOW (ref 22–32)
Calcium: 9.1 mg/dL (ref 8.9–10.3)
Chloride: 106 mmol/L (ref 98–111)
Creatinine, Ser: 0.66 mg/dL (ref 0.44–1.00)
GFR, Estimated: >60 mL/min (ref >60)
Glucose, Bld: 120 mg/dL — ABNORMAL HIGH (ref 70–99)
Potassium: 3.5 mmol/L (ref 3.5–5.1)
Sodium: 135 mmol/L (ref 135–145)
Total Bilirubin: 0.7 mg/dL (ref 0.3–1.2)
Total Protein: 6.5 g/dL (ref 6.5–8.1)

## 2020-11-19 LAB — CBC
HCT: 33.1 % — ABNORMAL LOW (ref 36.0–46.0)
Hemoglobin: 11.7 g/dL — ABNORMAL LOW (ref 12.0–15.0)
MCH: 33.7 pg (ref 26.0–34.0)
MCHC: 35.3 g/dL (ref 30.0–36.0)
MCV: 95.4 fL (ref 80.0–100.0)
Platelets: 290 10*3/uL (ref 150–400)
RBC: 3.47 MIL/uL — ABNORMAL LOW (ref 3.87–5.11)
RDW: 12.2 % (ref 11.5–15.5)
WBC: 11.4 10*3/uL — ABNORMAL HIGH (ref 4.0–10.5)
nRBC: 0 % (ref 0.0–0.2)

## 2020-11-19 LAB — TYPE AND SCREEN
ABO/RH(D): A POS
Antibody Screen: NEGATIVE

## 2020-11-19 LAB — PROTEIN / CREATININE RATIO, URINE
Creatinine, Urine: 220 mg/dL
Protein Creatinine Ratio: 0.11 mg/mg{Cre} (ref 0.00–0.15)
Total Protein, Urine: 24 mg/dL

## 2020-11-19 LAB — ABO/RH: ABO/RH(D): A POS

## 2020-11-19 MED ORDER — TERBUTALINE SULFATE 1 MG/ML IJ SOLN
0.2500 mg | Freq: Once | INTRAMUSCULAR | Status: AC
  Administered 2020-11-19: 0.25 mg via SUBCUTANEOUS

## 2020-11-19 MED ORDER — TERBUTALINE SULFATE 1 MG/ML IJ SOLN
INTRAMUSCULAR | Status: AC
  Filled 2020-11-19: qty 1

## 2020-11-19 MED ORDER — PHENYLEPHRINE 40 MCG/ML (10ML) SYRINGE FOR IV PUSH (FOR BLOOD PRESSURE SUPPORT): 80.0000 ug | PREFILLED_SYRINGE | INTRAVENOUS | Status: DC | PRN

## 2020-11-19 MED ORDER — OXYTOCIN BOLUS FROM INFUSION
333.0000 mL | Freq: Once | INTRAVENOUS | Status: AC
  Administered 2020-11-19: 333 mL via INTRAVENOUS

## 2020-11-19 MED ORDER — LIDOCAINE HCL (PF) 1 % IJ SOLN
30.0000 mL | INTRAMUSCULAR | Status: DC | PRN
  Filled 2020-11-19: qty 30

## 2020-11-19 MED ORDER — MISOPROSTOL 200 MCG PO TABS
ORAL_TABLET | ORAL | Status: AC
  Filled 2020-11-19: qty 4

## 2020-11-19 MED ORDER — BUPIVACAINE HCL (PF) 0.25 % IJ SOLN
INTRAMUSCULAR | Status: DC | PRN
  Administered 2020-11-19: 3 mL via EPIDURAL
  Administered 2020-11-19: 5 mL via EPIDURAL

## 2020-11-19 MED ORDER — FENTANYL 2.5 MCG/ML W/ROPIVACAINE 0.15% IN NS 100 ML EPIDURAL (ARMC)
12.0000 mL/h | EPIDURAL | Status: DC
Start: 2020-11-19 — End: 2020-11-20
  Administered 2020-11-19: 12 mL/h via EPIDURAL

## 2020-11-19 MED ORDER — SOD CITRATE-CITRIC ACID 500-334 MG/5ML PO SOLN: 30.0000 mL | ORAL | Status: DC | PRN

## 2020-11-19 MED ORDER — ACETAMINOPHEN 325 MG PO TABS: 650.0000 mg | ORAL_TABLET | ORAL | Status: DC | PRN

## 2020-11-19 MED ORDER — FENTANYL 2.5 MCG/ML W/ROPIVACAINE 0.15% IN NS 100 ML EPIDURAL (ARMC)
EPIDURAL | Status: AC
  Filled 2020-11-19: qty 100

## 2020-11-19 MED ORDER — ONDANSETRON HCL 4 MG/2ML IJ SOLN
4.0000 mg | Freq: Four times a day (QID) | INTRAMUSCULAR | Status: DC | PRN
  Administered 2020-11-19: 4 mg via INTRAVENOUS
  Filled 2020-11-19: qty 2

## 2020-11-19 MED ORDER — EPHEDRINE 5 MG/ML INJ: 10.0000 mg | INTRAVENOUS | Status: DC | PRN

## 2020-11-19 MED ORDER — LACTATED RINGERS IV SOLN
500.0000 mL | Freq: Once | INTRAVENOUS | Status: AC
  Administered 2020-11-19: 500 mL via INTRAVENOUS

## 2020-11-19 MED ORDER — LACTATED RINGERS IV SOLN: INTRAVENOUS | Status: DC

## 2020-11-19 MED ORDER — DIPHENHYDRAMINE HCL 50 MG/ML IJ SOLN: 12.5000 mg | INTRAMUSCULAR | Status: DC | PRN

## 2020-11-19 MED ORDER — TERBUTALINE SULFATE 1 MG/ML IJ SOLN
0.2500 mg | Freq: Once | INTRAMUSCULAR | Status: AC | PRN
  Administered 2020-11-19: 0.25 mg via SUBCUTANEOUS
  Filled 2020-11-19: qty 1

## 2020-11-19 MED ORDER — BUTORPHANOL TARTRATE 1 MG/ML IJ SOLN: 2.0000 mg | INTRAMUSCULAR | Status: DC | PRN

## 2020-11-19 MED ORDER — LIDOCAINE HCL (PF) 1 % IJ SOLN
INTRAMUSCULAR | Status: DC | PRN
  Administered 2020-11-19: 3 mL

## 2020-11-19 MED ORDER — OXYTOCIN-SODIUM CHLORIDE 30-0.9 UT/500ML-% IV SOLN
2.5000 [IU]/h | INTRAVENOUS | Status: DC
  Filled 2020-11-19: qty 500

## 2020-11-19 MED ORDER — OXYTOCIN 10 UNIT/ML IJ SOLN
INTRAMUSCULAR | Status: AC
  Filled 2020-11-19: qty 2

## 2020-11-19 MED ORDER — LIDOCAINE-EPINEPHRINE (PF) 1.5 %-1:200000 IJ SOLN
INTRAMUSCULAR | Status: DC | PRN
  Administered 2020-11-19: 3 mL via EPIDURAL

## 2020-11-19 MED ORDER — MISOPROSTOL 25 MCG QUARTER TABLET
25.0000 ug | ORAL_TABLET | ORAL | Status: DC | PRN
  Administered 2020-11-19: 25 ug via VAGINAL
  Filled 2020-11-19: qty 1

## 2020-11-19 MED ORDER — OXYTOCIN-SODIUM CHLORIDE 30-0.9 UT/500ML-% IV SOLN
1.0000 m[IU]/min | INTRAVENOUS | Status: DC
  Administered 2020-11-19: 1 m[IU]/min via INTRAVENOUS
  Filled 2020-11-19: qty 500

## 2020-11-19 MED ORDER — AMMONIA AROMATIC IN INHA
RESPIRATORY_TRACT | Status: AC
  Filled 2020-11-19: qty 10

## 2020-11-19 MED ORDER — LACTATED RINGERS IV SOLN
500.0000 mL | INTRAVENOUS | Status: DC | PRN
  Administered 2020-11-19: 500 mL via INTRAVENOUS

## 2020-11-19 NOTE — H&P (Signed)
Obstetrics Admission History & Physical  11/19/2020 - 9:58 AM Primary OBGYN: [facility] Chief Complaint: IOL for cHTN History of Present Illness  25 y.o. G2P0010 @ [redacted]w[redacted]d (Dating: LMP = 9w [place]), with the above CC. Pregnancy complicated by: cHTN (currently controlled with metoprolol and nifedipine), single umbilical artery, obesity (pregravid BMI: 37).  Ms. Brenda Osborn presents for IOL for cHTN. Patient reports occasional contractions, +FM. Patient denies VB. Patient does note that last night she experienced some dampness on her underwear and is unsure if she is leaking fluid.   Review of Systems: Positive for abdominal pain, occasional contractions, vaginal discharge.  Otherwise, her 12 point review of systems is negative or as noted in the History of Present Illness.  PMHx:  Past Medical History:  Diagnosis Date  . Hypertension    PSHx:  Past Surgical History:  Procedure Laterality Date  . NO PAST SURGERIES     Medications:  Medications Prior to Admission  Medication Sig Dispense Refill Last Dose  . aspirin EC 81 MG tablet Take 1 tablet (81 mg total) by mouth daily. 300 tablet 2 11/19/2020 at Unknown time  . metoprolol succinate (TOPROL-XL) 50 MG 24 hr tablet Take by mouth 2 (two) times daily. Take 1 tab BID   11/19/2020 at Unknown time  . NIFEdipine (PROCARDIA-XL/NIFEDICAL-XL) 30 MG 24 hr tablet TAKE 1 TABLET BY MOUTH DAILY. CAN INCREASE TO TWICE A DAY AS NEEDED FOR SYMPTOMATIC CONTRACTIONS. 30 tablet 2 11/19/2020 at Unknown time  . Prenatal Vit-Fe Fumarate-FA (PRENATAL MULTIVITAMIN) TABS tablet Take 1 tablet by mouth daily at 12 noon.   11/19/2020 at Unknown time  . Specialty Vitamins Products (MAGNESIUM, AMINO ACID CHELATE,) 133 MG tablet Take 2 tablets by mouth 2 (two) times daily. (Patient not taking: No sig reported)   Not Taking at Unknown time     Allergies: is allergic to zithromax [azithromycin]. OBHx:  OB History  Gravida Para Term Preterm AB Living  2 0 0 0 1 0   SAB IAB Ectopic Multiple Live Births  1 0 0 0 0    # Outcome Date GA Lbr Len/2nd Weight Sex Delivery Anes PTL Lv  2 Current           1 SAB 08/2019           GYNHx:  History of STIs: No.            FHx:  Family History  Problem Relation Age of Onset  . Diabetes Mother        "Pre Diabetes"  . Healthy Sister   . Hypertension Maternal Grandmother   . Healthy Sister   . Breast cancer Neg Hx   . Ovarian cancer Neg Hx   . Colon cancer Neg Hx    Soc Hx:  Social History   Socioeconomic History  . Marital status: Married    Spouse name: Marden Noble   . Number of children: Not on file  . Years of education: Not on file  . Highest education level: Not on file  Occupational History  . Not on file  Tobacco Use  . Smoking status: Never Smoker  . Smokeless tobacco: Never Used  Vaping Use  . Vaping Use: Never used  Substance and Sexual Activity  . Alcohol use: No  . Drug use: No  . Sexual activity: Not Currently    Partners: Male  Other Topics Concern  . Not on file  Social History Narrative  . Not on file   Social Determinants  of Health   Financial Resource Strain: Not on file  Food Insecurity: Not on file  Transportation Needs: Not on file  Physical Activity: Not on file  Stress: Not on file  Social Connections: Not on file  Intimate Partner Violence: Not on file    Objective   Vitals:   11/19/20 0837  BP: 125/82  Pulse: (!) 105  Resp: 16  Temp: 98.2 F (36.8 C)   Temp:  [98.2 F (36.8 C)] 98.2 F (36.8 C) (02/20 0837) Pulse Rate:  [105] 105 (02/20 0837) Resp:  [16] 16 (02/20 0837) BP: (125)/(82) 125/82 (02/20 0837) Weight:  [118.8 kg] 118.8 kg (02/20 0830) Temp (24hrs), Avg:98.2 F (36.8 C), Min:98.2 F (36.8 C), Max:98.2 F (36.8 C)  No intake or output data in the 24 hours ending 11/19/20 0958   EFM: 135 bmp, moderate variability, +accels, -decels - category I Toco: irregular contractions - palpate mild  General: Well nourished, well developed  female in no acute distress.  Skin:  Warm and dry.  Cardiovascular: S1, S2 normal, no murmur, rub or gallop, regular rate and rhythm Respiratory:  Clear to auscultation bilateral. Normal respiratory effort Abdomen: gravid - size c/w dates Neuro/Psych:  Normal mood and affect.  SVE: 1/50/-3/anterior [place]/EFW: vtx, 6.5#  Labs   Recent Labs  Lab 11/19/20 0858  WBC 11.4*  HGB 11.7*  HCT 33.1*  PLT 290    Recent Labs  Lab 11/19/20 0858  NA 135  K 3.5  CL 106  CO2 18*  BUN 10  CREATININE 0.66  CALCIUM 9.1  PROT 6.5  BILITOT 0.7  ALKPHOS 118  ALT 16  AST 28  GLUCOSE 120*    Radiology n/a  Perinatal info  PENDING/ Rubella  Immune / Varicella Not immune/RPR non reactive/HIV negative/HepB Surf Ag negative/TDaP: UTD - 10/11/20 /pap 03/13/18- NILM  ABO, Rh: --/--/PENDING (02/20 5498) Antibody: PENDING (02/20 0858) Rubella: 1.66 (08/12 1449) RPR: Non Reactive (11/30 1136)  HBsAg: Negative (08/12 1449)  HIV: Non Reactive (11/30 1136)  YME:BRAXENMM/-- (02/09 1607) 1 hr GTT:  143 3 hr GTT: negative x4 Genetic screening normal Anatomy US - 2VC  Assessment & Plan   25 y.o. G2P0010 @ [redacted]w[redacted]d with for IOL due to cHTN (controlled with two anti-hypertensive medications), single umbilical artery  Plan: Patient will undergo induction of labor with cervical ripening agents.     Patient has been fully informed of the pros and cons, risks and benefits of continued observation with fetal monitoring versus that of induction of labor.   She understands that there are uncommon risks to induction, which include but are not limited to : frequent or prolonged uterine contractions, fetal distress, uterine rupture, and lack of successful induction.  These risks include all methods including Pitocin and Misoprostol. She also has been informed of the increased risks for Cesarean with induction and should induction not be successful.  Patient consents to the induction plan of  management.  *Will start with cervical ripening - cytotec and foley bulb placed *GBS: negative *Analgesia: reviewed options with patient - considering epidural  Orlie Pollen, CNM

## 2020-11-19 NOTE — Anesthesia Preprocedure Evaluation (Signed)
Anesthesia Evaluation  Patient identified by MRN, date of birth, ID band Patient awake    Reviewed: Allergy & Precautions, H&P , NPO status , Patient's Chart, lab work & pertinent test results, reviewed documented beta blocker date and time   Airway Mallampati: II  TM Distance: >3 FB Neck ROM: full    Dental no notable dental hx. (+) Teeth Intact   Pulmonary neg pulmonary ROS, Current Smoker,    Pulmonary exam normal breath sounds clear to auscultation       Cardiovascular Exercise Tolerance: Good hypertension, On Medications negative cardio ROS   Rhythm:regular Rate:Normal     Neuro/Psych  Headaches, negative psych ROS   GI/Hepatic negative GI ROS, Neg liver ROS,   Endo/Other  negative endocrine ROSdiabetes, Gestational  Renal/GU      Musculoskeletal   Abdominal   Peds  Hematology negative hematology ROS (+)   Anesthesia Other Findings   Reproductive/Obstetrics (+) Pregnancy                             Anesthesia Physical Anesthesia Plan  ASA: II  Anesthesia Plan: Epidural   Post-op Pain Management:    Induction:   PONV Risk Score and Plan:   Airway Management Planned:   Additional Equipment:   Intra-op Plan:   Post-operative Plan:   Informed Consent: I have reviewed the patients History and Physical, chart, labs and discussed the procedure including the risks, benefits and alternatives for the proposed anesthesia with the patient or authorized representative who has indicated his/her understanding and acceptance.       Plan Discussed with:   Anesthesia Plan Comments:         Anesthesia Quick Evaluation

## 2020-11-19 NOTE — Progress Notes (Signed)
Labor Progress Note  Brenda Osborn is a 25 y.o. G2P0010 at [redacted]w[redacted]d by LMP/9w [place] admitted for induction of labor due to [place].  Subjective: Patient reports increased discomfort with contractions. Stays the feel like bad cramps.   Objective: BP 117/64   Pulse 95   Temp 97.9 F (36.6 C) (Oral)   Resp 16   Ht 5\' 8"  (1.727 m)   Wt 118.8 kg Comment: 262lbs  LMP 02/28/2020 (Approximate)   SpO2 96%   BMI 39.84 kg/m  Notable VS details: wnl  Fetal Assessment: FHT:  FHR: 125 bpm, variability: moderate,  accelerations:  Present,  decelerations:  Absent Category/reactivity:  Category I UC:   irregular, every 2-5 minutes SVE:   4/60-70/-3 Membrane status: intact Amniotic color: n/a  Labs: Lab Results  Component Value Date   WBC 11.4 (H) 11/19/2020   HGB 11.7 (L) 11/19/2020   HCT 33.1 (L) 11/19/2020   MCV 95.4 11/19/2020   PLT 290 11/19/2020    Assessment / Plan: Induction of labor due to cHTN,  progressing well following single dose of cytotec and foley bulb placement - foley now out  Labor: Will initiate pitocin Preeclampsia:  no S&S of PIH Fetal Wellbeing:  Category I Pain Control:  Labor support without medications I/D:  GBS negative Anticipated MOD:  NSVD  Brenda Osborn, CNM 11/19/2020, 7:32 PM

## 2020-11-19 NOTE — Discharge Summary (Signed)
OB Discharge Summary     Patient Name: Brenda Osborn DOB: 1996/02/15 MRN: [id number]  Date of admission: 11/19/2020 Delivering MD: Orlie Pollen, CNM  Date of Delivery:  11/19/2020 Date of discharge: 11/21/2020  Admitting diagnosis: Chronic hypertension affecting pregnancy [W10.932], Single umbilical artery [T55.7] Intrauterine pregnancy: [redacted]w[redacted]d    Secondary diagnosis: None     Discharge diagnosis: Term Pregnancy Delivered and CHTN                                                                                       Post partum procedures:none  Augmentation: AROM, Pitocin and Cytotec  Complications: None  Hospital course:  Induction of Labor With Vaginal Delivery   25 y.o. yo G2P0010 at [redacted]w[redacted]d was admitted to the hospital 11/19/2020 for induction of labor.  Indication for induction: cHTN.  Patient had an uncomplicated labor course as follows: Membrane Rupture Time/Date: 2:42 PM ,11/19/2020   Delivery Method:Vaginal, Spontaneous  Episiotomy: None  Lacerations:  2nd degree  Details of delivery can be found in separate delivery note.  Patient had a routine postpartum course. Patient is discharged home 11/21/20.  Newborn Data: Birth date:11/19/2020  Birth time:10:16 PM  Gender:Female  Living status:Living  Apgars:7 ,8  Weight:2660 g   Physical exam  Vitals:   11/20/20 2042 11/20/20 2350 11/21/20 0552 11/21/20 0857  BP: 127/75 123/86 124/85 135/88  Pulse: 88 86 95 89  Resp: 20 18 20 18   Temp:  98.3 F (36.8 C) (!) 97.4 F (36.3 C) 98.1 F (36.7 C)  TempSrc:  Oral Oral Oral  SpO2: 97% 96% 100% 99%  Weight:      Height:       General: alert, cooperative and no distress Lochia: appropriate Uterine Fundus: firm Incision: N/A DVT Evaluation: No evidence of DVT seen on physical exam.  Labs: Lab Results  Component Value Date   WBC 17.3 (H) 11/20/2020   HGB 11.2 (L) 11/20/2020   HCT 31.6 (L) 11/20/2020   MCV 94.9 11/20/2020   PLT 270 11/20/2020    Discharge  instruction: per After Visit Summary.  Medications:  Allergies as of 11/21/2020      Reactions   Zithromax [azithromycin]    Respiratory Destress      Medication List    STOP taking these medications   aspirin EC 81 MG tablet     TAKE these medications   ibuprofen 600 MG tablet Commonly known as: ADVIL Take 1 tablet (600 mg total) by mouth every 6 (six) hours.   magnesium (amino acid chelate) 133 MG tablet Take 2 tablets by mouth 2 (two) times daily.   metoprolol succinate 50 MG 24 hr tablet Commonly known as: TOPROL-XL Take by mouth 2 (two) times daily. Take 1 tab BID   NIFEdipine 30 MG 24 hr tablet Commonly known as: PROCARDIA-XL/NIFEDICAL-XL TAKE 1 TABLET BY MOUTH DAILY. CAN INCREASE TO TWICE A DAY AS NEEDED FOR SYMPTOMATIC CONTRACTIONS.   prenatal multivitamin Tabs tablet Take 1 tablet by mouth daily at 12 noon.       Diet: routine diet  Activity: Advance as tolerated. Pelvic rest for 6 weeks.   Outpatient follow up:  Follow-up Information  Orlie Pollen, RN. Schedule an appointment as soon as possible for a visit.   Why: Follow-up in 1 week for BP check and in 6 weeks for rountine postpartum visit. Contact information: [address] [place] [address] [phone]                 Postpartum contraception: Undecided Rhogam Given postpartum: n/a Rubella vaccine given postpartum: no Varicella vaccine given postpartum: no TDaP given antepartum or postpartum: UTD - 10/11/20  Baby Feeding: Bottle  Disposition:home with mother  SIGNED: Malachy Mood, MD, [place], [facility] 11/21/2020, 9:54 AM

## 2020-11-19 NOTE — Anesthesia Procedure Notes (Signed)
Epidural Patient location during procedure: OB  Staffing Performed: anesthesiologist   Preanesthetic Checklist Completed: patient identified, IV checked, site marked, risks and benefits discussed, surgical consent, monitors and equipment checked, pre-op evaluation and timeout performed  Epidural Patient position: sitting Prep: Betadine Patient monitoring: heart rate, continuous pulse ox and blood pressure Approach: midline Location: L4-L5 Injection technique: LOR saline  Needle:  Needle type: Tuohy  Needle gauge: 17 G Needle length: 9 cm and 9 Needle insertion depth: 6 cm Catheter type: closed end flexible Catheter size: 19 Gauge Catheter at skin depth: 12 cm Test dose: negative and 1.5% lidocaine with Epi 1:200 K  Assessment Sensory level: T10 Events: blood not aspirated, injection not painful, no injection resistance, no paresthesia and negative IV test  Additional Notes   Patient tolerated the insertion well without complications.-SATD -IVTD. No paresthesia. Refer to [place] for VS and dosingReason for block:procedure for pain

## 2020-11-20 ENCOUNTER — Encounter: Payer: Self-pay | Admitting: Obstetrics and Gynecology

## 2020-11-20 LAB — CBC
HCT: 31.6 % — ABNORMAL LOW (ref 36.0–46.0)
Hemoglobin: 11.2 g/dL — ABNORMAL LOW (ref 12.0–15.0)
MCH: 33.6 pg (ref 26.0–34.0)
MCHC: 35.4 g/dL (ref 30.0–36.0)
MCV: 94.9 fL (ref 80.0–100.0)
Platelets: 270 10*3/uL (ref 150–400)
RBC: 3.33 MIL/uL — ABNORMAL LOW (ref 3.87–5.11)
RDW: 12.5 % (ref 11.5–15.5)
WBC: 17.3 10*3/uL — ABNORMAL HIGH (ref 4.0–10.5)
nRBC: 0 % (ref 0.0–0.2)

## 2020-11-20 LAB — RPR: RPR Ser Ql: NONREACTIVE

## 2020-11-20 MED ORDER — IBUPROFEN 600 MG PO TABS
600.0000 mg | ORAL_TABLET | Freq: Four times a day (QID) | ORAL | Status: DC
  Administered 2020-11-20 – 2020-11-21 (×6): 600 mg via ORAL
  Filled 2020-11-20 (×6): qty 1

## 2020-11-20 MED ORDER — DIPHENHYDRAMINE HCL 25 MG PO CAPS
25.0000 mg | ORAL_CAPSULE | Freq: Four times a day (QID) | ORAL | Status: DC | PRN
Start: 2020-11-20 — End: 2020-11-21

## 2020-11-20 MED ORDER — OXYCODONE HCL 5 MG PO TABS: 10.0000 mg | ORAL_TABLET | ORAL | Status: DC | PRN

## 2020-11-20 MED ORDER — DIBUCAINE (PERIANAL) 1 % EX OINT
1.0000 "application " | TOPICAL_OINTMENT | CUTANEOUS | Status: DC | PRN
  Administered 2020-11-20: 1 via RECTAL
  Filled 2020-11-20 (×2): qty 28

## 2020-11-20 MED ORDER — NIFEDIPINE ER OSMOTIC RELEASE 30 MG PO TB24
30.0000 mg | ORAL_TABLET | Freq: Every day | ORAL | Status: DC
  Administered 2020-11-20: 30 mg via ORAL
  Filled 2020-11-20: qty 1

## 2020-11-20 MED ORDER — NIFEDIPINE ER OSMOTIC RELEASE 30 MG PO TB24: 30.0000 mg | ORAL_TABLET | Freq: Every day | ORAL | Status: DC

## 2020-11-20 MED ORDER — COCONUT OIL OIL: 1.0000 "application " | TOPICAL_OIL | Status: DC | PRN

## 2020-11-20 MED ORDER — PRENATAL MULTIVITAMIN CH
1.0000 | ORAL_TABLET | Freq: Every day | ORAL | Status: DC
  Administered 2020-11-20 – 2020-11-21 (×2): 1 via ORAL
  Filled 2020-11-20 (×2): qty 1

## 2020-11-20 MED ORDER — SIMETHICONE 80 MG PO CHEW: 80.0000 mg | CHEWABLE_TABLET | ORAL | Status: DC | PRN

## 2020-11-20 MED ORDER — TETANUS-DIPHTH-ACELL PERTUSSIS 5-2.5-18.5 LF-MCG/0.5 IM SUSY: 0.5000 mL | PREFILLED_SYRINGE | Freq: Once | INTRAMUSCULAR | Status: DC

## 2020-11-20 MED ORDER — WITCH HAZEL-GLYCERIN EX PADS
1.0000 "application " | MEDICATED_PAD | CUTANEOUS | Status: DC | PRN
  Administered 2020-11-20: 1 via TOPICAL
  Filled 2020-11-20 (×3): qty 100

## 2020-11-20 MED ORDER — ACETAMINOPHEN 325 MG PO TABS: 650.0000 mg | ORAL_TABLET | ORAL | Status: DC | PRN

## 2020-11-20 MED ORDER — METOPROLOL SUCCINATE ER 25 MG PO TB24
50.0000 mg | ORAL_TABLET | Freq: Every day | ORAL | Status: DC
  Administered 2020-11-21: 50 mg via ORAL
  Filled 2020-11-20: qty 2

## 2020-11-20 MED ORDER — ONDANSETRON HCL 4 MG PO TABS: 4.0000 mg | ORAL_TABLET | ORAL | Status: DC | PRN

## 2020-11-20 MED ORDER — METOPROLOL SUCCINATE ER 25 MG PO TB24
50.0000 mg | ORAL_TABLET | Freq: Two times a day (BID) | ORAL | Status: DC
  Administered 2020-11-20: 50 mg via ORAL
  Filled 2020-11-20 (×4): qty 2

## 2020-11-20 MED ORDER — BENZOCAINE-MENTHOL 20-0.5 % EX AERO
1.0000 "application " | INHALATION_SPRAY | CUTANEOUS | Status: DC | PRN
  Administered 2020-11-20: 1 via TOPICAL
  Filled 2020-11-20 (×2): qty 56

## 2020-11-20 MED ORDER — ONDANSETRON HCL 4 MG/2ML IJ SOLN: 4.0000 mg | INTRAMUSCULAR | Status: DC | PRN

## 2020-11-20 MED ORDER — OXYCODONE HCL 5 MG PO TABS: 5.0000 mg | ORAL_TABLET | ORAL | Status: DC | PRN

## 2020-11-20 MED ORDER — ZOLPIDEM TARTRATE 5 MG PO TABS: 5.0000 mg | ORAL_TABLET | Freq: Every evening | ORAL | Status: DC | PRN

## 2020-11-20 MED ORDER — SENNOSIDES-DOCUSATE SODIUM 8.6-50 MG PO TABS
2.0000 | ORAL_TABLET | Freq: Every day | ORAL | Status: DC
  Administered 2020-11-20 – 2020-11-21 (×2): 2 via ORAL
  Filled 2020-11-20 (×2): qty 2

## 2020-11-20 NOTE — Plan of Care (Signed)
Transferred to Room 349 PP. Alert and oriented with aprop. Affect. Color good, skin W&D> Assessment and VS WNL. Oriented to room,  Fall Level and prevention, Safety and Security and POC. Pt. V/O.

## 2020-11-20 NOTE — Progress Notes (Signed)
Subjective:  Doing well postpartum day 1. She notices some swelling in her right hand. She denies headache, visual changes, epigastric pain. Her blood pressures have been in normal range during her hospital stay. She is tolerating regular diet. Her pain is controlled with PO medication. She is ambulating and voiding without difficulty.  Objective:  Vital signs in last 24 hours: Temp:  [97.5 F (36.4 C)-98.3 F (36.8 C)] 97.9 F (36.6 C) (02/21 0759) Pulse Rate:  [77-101] 83 (02/21 0759) Resp:  [14-20] 20 (02/21 0759) BP: (106-135)/(53-88) 117/74 (02/21 0759) SpO2:  [96 %-100 %] 98 % (02/21 0759)    Patient Vitals for the past 24 hrs:  BP Temp Temp src Pulse Resp SpO2  11/20/20 0759 117/74 97.9 F (36.6 C) Oral 83 20 98 %  11/20/20 0403 120/76 98.2 F (36.8 C) Oral 89 20 99 %  11/20/20 0206 106/73 97.8 F (36.6 C) Oral 90 20 98 %  11/20/20 0111 122/84 98.2 F (36.8 C) Oral 92 18 99 %  11/20/20 0035 120/62 -- -- 96 -- --  11/20/20 0020 118/64 -- -- 96 -- --  11/20/20 0005 121/88 -- -- 97 -- --  11/19/20 2350 (!) 123/56 -- -- 87 -- --  11/19/20 2335 (!) 121/53 -- -- 93 -- --  11/19/20 2321 (!) 119/55 -- -- 93 -- --  11/19/20 2253 118/73 -- -- 90 -- --  11/19/20 2237 135/66 -- -- (!) 101 -- --  11/19/20 2100 -- -- -- -- -- 100 %  11/19/20 2030 -- -- -- -- -- 100 %  11/19/20 1925 -- (!) 97.5 F (36.4 C) Oral -- -- --  11/19/20 1900 -- -- -- -- -- 96 %  11/19/20 1855 117/64 -- -- 95 -- 100 %  11/19/20 1850 -- -- -- -- -- 100 %  11/19/20 1848 124/65 -- -- 86 -- --  11/19/20 1845 -- -- -- -- -- 100 %  11/19/20 1840 -- -- -- -- -- 100 %  11/19/20 1835 -- -- -- -- -- 100 %  11/19/20 1830 -- -- -- -- -- 100 %  11/19/20 1825 -- -- -- -- -- 100 %  11/19/20 1820 -- -- -- -- -- 100 %  11/19/20 1815 -- -- -- -- -- 100 %  11/19/20 1810 -- -- -- -- -- 100 %  11/19/20 1805 -- -- -- -- -- 100 %  11/19/20 1800 -- -- -- -- -- 100 %  11/19/20 1755 122/62 97.9 F (36.6 C) Oral 84 --  100 %  11/19/20 1750 -- -- -- -- -- 100 %  11/19/20 1745 -- -- -- -- -- 100 %  11/19/20 1740 -- -- -- -- -- 100 %  11/19/20 1735 -- -- -- -- -- 100 %  11/19/20 1730 -- -- -- -- -- 100 %  11/19/20 1725 -- -- -- -- -- 100 %  11/19/20 1720 -- -- -- -- -- 100 %  11/19/20 1718 125/70 -- -- 78 -- --  11/19/20 1715 -- -- -- -- -- 100 %  11/19/20 1710 -- -- -- -- -- 100 %  11/19/20 1705 -- -- -- -- -- 100 %  11/19/20 1700 -- -- -- -- -- 100 %  11/19/20 1655 127/69 -- -- 81 -- 100 %  11/19/20 1653 -- -- -- -- -- 100 %  11/19/20 1651 -- -- -- -- -- 100 %  11/19/20 1650 -- -- -- -- -- 100 %  11/19/20 1645 -- -- -- -- --  100 %  11/19/20 1640 124/71 97.6 F (36.4 C) -- 77 -- 100 %  11/19/20 1635 -- -- -- -- -- 100 %  11/19/20 1414 124/87 98.3 F (36.8 C) Oral 82 -- --  11/19/20 1207 124/83 97.8 F (36.6 C) Oral 80 16 --  11/19/20 1007 123/84 -- -- 87 14 --   General: NAD Pulmonary: no increased work of breathing Abdomen: non-distended, non-tender, fundus firm at level of umbilicus Extremities: no edema, no erythema, no tenderness  Results for orders placed or performed during the hospital encounter of 11/19/20 (from the past 72 hour(s))  CBC     Status: Abnormal   Collection Time: 11/19/20  8:58 AM  Result Value Ref Range   WBC 11.4 (H) 4.0 - 10.5 K/uL   RBC 3.47 (L) 3.87 - 5.11 MIL/uL   Hemoglobin 11.7 (L) 12.0 - 15.0 g/dL   HCT 33.1 (L) 36.0 - 46.0 %   MCV 95.4 80.0 - 100.0 fL   MCH 33.7 26.0 - 34.0 pg   MCHC 35.3 30.0 - 36.0 g/dL   RDW 12.2 11.5 - 15.5 %   Platelets 290 150 - 400 K/uL   nRBC 0.0 0.0 - 0.2 %    Comment: Performed at [facility], [address] [place], [place] [address]  Type and screen     Status: None   Collection Time: 11/19/20  8:58 AM  Result Value Ref Range   ABO/RH(D) A POS    Antibody Screen NEG    Sample Expiration      11/22/2020,2359 Performed at [facility], [address] [place], [place] [address]   Protein /  creatinine ratio, urine     Status: None   Collection Time: 11/19/20  8:58 AM  Result Value Ref Range   Creatinine, Urine 220 mg/dL   Total Protein, Urine 24 mg/dL    Comment: NO NORMAL RANGE ESTABLISHED FOR THIS TEST   Protein Creatinine Ratio 0.11 0.00 - 0.15 mg/mg[Cre]    Comment: Performed at [facility], [address] [place], [place] [address]  Comprehensive metabolic panel     Status: Abnormal   Collection Time: 11/19/20  8:58 AM  Result Value Ref Range   Sodium 135 135 - 145 mmol/L   Potassium 3.5 3.5 - 5.1 mmol/L   Chloride 106 98 - 111 mmol/L   CO2 18 (L) 22 - 32 mmol/L   Glucose, Bld 120 (H) 70 - 99 mg/dL    Comment: Glucose reference range applies only to samples taken after fasting for at least 8 hours.   BUN 10 6 - 20 mg/dL   Creatinine, Ser 0.66 0.44 - 1.00 mg/dL   Calcium 9.1 8.9 - 10.3 mg/dL   Total Protein 6.5 6.5 - 8.1 g/dL   Albumin 3.0 (L) 3.5 - 5.0 g/dL   AST 28 15 - 41 U/L   ALT 16 0 - 44 U/L   Alkaline Phosphatase 118 38 - 126 U/L   Total Bilirubin 0.7 0.3 - 1.2 mg/dL   GFR, Estimated >60 >60 mL/min    Comment: (NOTE) Calculated using the CKD-EPI Creatinine Equation (2021)    Anion gap 11 5 - 15    Comment: Performed at [facility], [address], [place], [place] [address]  ABO/Rh     Status: None   Collection Time: 11/19/20 10:09 AM  Result Value Ref Range   ABO/RH(D)      A POS Performed at [facility], [address] [place], [place] [address]   CBC     Status: Abnormal   Collection Time: 11/20/20  4:12 AM  Result Value Ref Range   WBC 17.3 (H) 4.0 - 10.5 K/uL   RBC 3.33 (L) 3.87 - 5.11 MIL/uL   Hemoglobin 11.2 (L) 12.0 - 15.0 g/dL   HCT 31.6 (L) 36.0 - 46.0 %   MCV 94.9 80.0 - 100.0 fL   MCH 33.6 26.0 - 34.0 pg   MCHC 35.4 30.0 - 36.0 g/dL   RDW 12.5 11.5 - 15.5 %   Platelets 270 150 - 400 K/uL   nRBC 0.0 0.0 - 0.2 %    Comment: Performed at [facility], [address] [place],  [place] [address]    Assessment:   25 y.o. G2P1011 postpartum day # 1, chronic hypertension  Plan:    1) Acute blood loss anemia - hemodynamically stable and asymptomatic - po ferrous sulfate  2) Chronic hypertension: 2 am dose of metoprolol held for low-normal bp. Re-evaluate prior to next dose of metoprolol and procardia for adjustment of dose/scheduling  3) Blood Type --/--/A POS Performed at [facility], [address] [place], [place] [address]  [phone]) / Rubella 1.66 (08/12 1449) / Varicella Non-Immune  4) TDAP status up to date  5) Feeding plan formula  6)  Education given regarding options for contraception, as well as compatibility with breast feeding if applicable.  Patient plans on undecided for contraception.  7) Disposition: continue current care   Rod Can, [facility] 11/20/2020, 9:44 AM

## 2020-11-20 NOTE — Progress Notes (Signed)
Margarita Rana CNM notified that Pt. B/P is 107/73 and that Pt. Is due for her Metoprolol. Margarita Rana CNM ordered medication to be held at this time.

## 2020-11-20 NOTE — Anesthesia Postprocedure Evaluation (Signed)
Anesthesia Post Note  Patient: Brenda Osborn  Procedure(s) Performed: AN AD HOC LABOR EPIDURAL  Patient location during evaluation: Mother Baby Anesthesia Type: Epidural Level of consciousness: awake and alert Pain management: pain level controlled Vital Signs Assessment: post-procedure vital signs reviewed and stable Respiratory status: spontaneous breathing, nonlabored ventilation and respiratory function stable Cardiovascular status: stable Postop Assessment: no headache, no backache and epidural receding Anesthetic complications: no   No complications documented.   Last Vitals:  Vitals:   11/20/20 0206 11/20/20 0403  BP: 106/73 120/76  Pulse: 90 89  Resp: 20 20  Temp: 36.6 C 36.8 C  SpO2: 98% 99%    Last Pain:  Vitals:   11/20/20 0403  TempSrc: Oral  PainSc:                  Norm Salt

## 2020-11-21 MED ORDER — IBUPROFEN 600 MG PO TABS: 600.0000 mg | ORAL_TABLET | Freq: Four times a day (QID) | ORAL | 0 refills | Status: DC

## 2020-11-21 NOTE — Progress Notes (Signed)
Discharge instructions reviewed with patient who verbalized understanding. Discharged home with infant.

## 2020-11-28 ENCOUNTER — Encounter: Payer: Self-pay | Admitting: Obstetrics and Gynecology

## 2020-11-28 ENCOUNTER — Other Ambulatory Visit: Payer: Self-pay

## 2020-11-28 ENCOUNTER — Ambulatory Visit (INDEPENDENT_AMBULATORY_CARE_PROVIDER_SITE_OTHER): Payer: Managed Care, Other (non HMO) | Admitting: Obstetrics and Gynecology

## 2020-11-28 VITALS — BP 120/80 | Ht 68.0 in | Wt 238.0 lb

## 2020-11-28 DIAGNOSIS — Z1332 Encounter for screening for maternal depression: Secondary | ICD-10-CM

## 2020-11-28 DIAGNOSIS — I10 Essential (primary) hypertension: Secondary | ICD-10-CM

## 2020-11-28 NOTE — Progress Notes (Signed)
Postpartum Visit  Chief Complaint:  Chief Complaint  Patient presents with  . Blood Pressure Check    History of Present Illness: Patient is a 25 y.o. G2P1011 presents for postpartum visit for a blood pressure check and mood check. Patient has a history of cHTN, managed with two antihypertensives during pregnancy. Today patient denies S&S of preeclampsia including persistent HA, changes in vision, or RUQ pain. Patient does report intermittent signs of low blood pressure during this past week including several instances of LOC, feeling lightheaded, or dizzy.   Date of delivery: 11/19/20 Type of delivery: Vaginal delivery - Vacuum or forceps assisted  no Episiotomy No.  Laceration: yes  Pregnancy or labor problems:  no Any problems since the delivery:  See HPI  Newborn Details:  SINGLETON :  1. BabyGender female. Birth weight:   Maternal Details:  Breast or formula feeding: formula feeding Intercourse: No  Contraception after delivery: undecided Any bowel or bladder issues: No  Post partum depression/anxiety noted:  Yes - patient reports daily anxiety surrounding infant well-being, patient reports adequate support at home from mother and significant other, feels she is coping ok at this time [place] Post-Partum Depression Score: 9 Date of last PAP: 02/2018  NILM   Review of Systems: Review of Systems  Constitutional: Negative.   HENT: Negative.   Eyes: Negative for blurred vision and double vision.  Respiratory: Negative.   Cardiovascular: Negative.  Negative for leg swelling.  Gastrointestinal: Negative.   Genitourinary: Negative.   Musculoskeletal: Negative.   Skin: Negative.   Neurological: Positive for dizziness and loss of consciousness. Negative for headaches.  Endo/Heme/Allergies: Negative.   Psychiatric/Behavioral: Negative for depression. The patient is nervous/anxious.     The following portions of the patient's history were reviewed and updated as  appropriate: allergies, current medications, past family history, past medical history, past social history, past surgical history and problem list.  Past Medical History:  Past Medical History:  Diagnosis Date  . Hypertension     Past Surgical History:  Past Surgical History:  Procedure Laterality Date  . NO PAST SURGERIES      Family History:  Family History  Problem Relation Age of Onset  . Diabetes Mother        "Pre Diabetes"  . Healthy Sister   . Hypertension Maternal Grandmother   . Healthy Sister   . Breast cancer Neg Hx   . Ovarian cancer Neg Hx   . Colon cancer Neg Hx     Social History:  Social History   Socioeconomic History  . Marital status: Married    Spouse name: Marden Noble   . Number of children: Not on file  . Years of education: Not on file  . Highest education level: Not on file  Occupational History  . Not on file  Tobacco Use  . Smoking status: Never Smoker  . Smokeless tobacco: Never Used  Vaping Use  . Vaping Use: Never used  Substance and Sexual Activity  . Alcohol use: No  . Drug use: No  . Sexual activity: Not Currently    Partners: Male  Other Topics Concern  . Not on file  Social History Narrative  . Not on file   Social Determinants of Health   Financial Resource Strain: Not on file  Food Insecurity: Not on file  Transportation Needs: Not on file  Physical Activity: Not on file  Stress: Not on file  Social Connections: Not on file  Intimate Partner Violence: Not  on file    Allergies:  Allergies  Allergen Reactions  . Zithromax [Azithromycin]     Respiratory Destress    Medications: Prior to Admission medications   Medication Sig Start Date End Date Taking? Authorizing Provider  ibuprofen (ADVIL) 600 MG tablet Take 1 tablet (600 mg total) by mouth every 6 (six) hours. 11/21/20   Malachy Mood, MD  metoprolol succinate (TOPROL-XL) 50 MG 24 hr tablet Take by mouth daily. Take 1 tab BID 09/22/19 11/19/20  [provider]  Prenatal Vit-Fe Fumarate-FA (PRENATAL MULTIVITAMIN) TABS tablet Take 1 tablet by mouth daily at 12 noon.    [provider]  Specialty Vitamins Products (MAGNESIUM, AMINO ACID CHELATE,) 133 MG tablet Take 2 tablets by mouth 2 (two) times daily. Patient not taking: No sig reported    [provider]    Physical Exam Blood pressure 120/80, height 5\' 8"  (1.727 m), weight 238 lb (108 kg), last menstrual period 02/28/2020, unknown if currently breastfeeding.   General: NAD HEENT: normocephalic, anicteric Pulmonary: No increased work of breathing Extremities: no edema, erythema, or tenderness Neurologic: Grossly intact Psychiatric: mood appropriate, affect full   Edinburgh Postnatal Depression Scale - 11/28/20 1509      Edinburgh Postnatal Depression Scale:  In the Past 7 Days   I have been able to laugh and see the funny side of things. 0    I have looked forward with enjoyment to things. 0    I have blamed myself unnecessarily when things went wrong. 2    I have been anxious or worried for no good reason. 3    I have felt scared or panicky for no good reason. 2    Things have been getting on top of me. 2    I have been so unhappy that I have had difficulty sleeping. 0    I have felt sad or miserable. 0    I have been so unhappy that I have been crying. 0    The thought of harming myself has occurred to me. 0    Edinburgh Postnatal Depression Scale Total 9           Assessment: 25 y.o. [id number] presenting for a postpartum blood pressure check and mood check.  Plan: Problem List Items Addressed This Visit      Other   Postpartum care and examination    Other Visit Diagnoses    Encounter for screening for maternal depression    -  Primary   Chronic hypertension          1) cHTN - BP wnl during today's visit. Patient reporting symptoms consistent with hypotension at home. Will discontinue nifedipine and return to prepregnancy dose of  metoprolol, 50 mg, once daily. RTC in 1 week for BP check.  2) Patient reporting increase sx of anxiety, reviewed EPDS results with patient. Will monitor at next visit.   4) Return in about 1 week (around 12/05/2020) for BP check.   Orlie Pollen, CNM, MSN [facility] 11/28/2020, 3:20 PM

## 2020-12-05 ENCOUNTER — Other Ambulatory Visit: Payer: Self-pay

## 2020-12-05 ENCOUNTER — Ambulatory Visit (INDEPENDENT_AMBULATORY_CARE_PROVIDER_SITE_OTHER): Payer: Managed Care, Other (non HMO) | Admitting: Obstetrics and Gynecology

## 2020-12-05 ENCOUNTER — Encounter: Payer: Self-pay | Admitting: Obstetrics and Gynecology

## 2020-12-05 VITALS — BP 126/86 | HR 82 | Ht 68.0 in | Wt 235.0 lb

## 2020-12-05 DIAGNOSIS — O99345 Other mental disorders complicating the puerperium: Secondary | ICD-10-CM

## 2020-12-05 DIAGNOSIS — R55 Syncope and collapse: Secondary | ICD-10-CM

## 2020-12-05 DIAGNOSIS — I10 Essential (primary) hypertension: Secondary | ICD-10-CM

## 2020-12-05 DIAGNOSIS — F418 Other specified anxiety disorders: Secondary | ICD-10-CM

## 2020-12-05 DIAGNOSIS — Z0130 Encounter for examination of blood pressure without abnormal findings: Secondary | ICD-10-CM

## 2020-12-05 DIAGNOSIS — N939 Abnormal uterine and vaginal bleeding, unspecified: Secondary | ICD-10-CM

## 2020-12-05 DIAGNOSIS — F53 Postpartum depression: Secondary | ICD-10-CM

## 2020-12-05 MED ORDER — METOPROLOL SUCCINATE ER 25 MG PO TB24: 25.0000 mg | ORAL_TABLET | Freq: Every day | ORAL | 1 refills | Status: DC

## 2020-12-05 MED ORDER — ESCITALOPRAM OXALATE 20 MG PO TABS: 20.0000 mg | ORAL_TABLET | Freq: Every day | ORAL | 3 refills | Status: DC

## 2020-12-05 NOTE — Progress Notes (Addendum)
Postpartum Visit  Chief Complaint:  Chief Complaint  Patient presents with  . Follow-up    1 wk BP check - still having syncopal episodes. RM 4    History of Present Illness: Patient is a 25 y.o. G2P1011 presents for postpartum visit for a blood pressure check following discontinuing of nifedipine last week and decrease in metoprolol xl dosing. Patient has a history of cHTN, managed with two antihypertensives during pregnancy. Today patient denies S&S of preeclampsia including persistent HA, changes in vision, or RUQ pain. Patient reports three syncopal episodes this past week, since last office visit. Patient states the most recent episode of syncope occurred on Sunday evening during dinner with her family. Patient said she had completed her meal and was sitting when she felt flushed/lightheaded and had to place her head on the table. After that, her family noted a loss of consciousness that lasted 2-3 minutes. Patient denies SOB or palpitations. She does reports vaginal bleeding since delivery which lightened over the last few days but became heavier today, she states she passes "small clots" daily and occasionally larger clots, the biggest being the size of "a 50 cent piece." Additionally, today' the patient reports increased symptoms of anxiety, stating she finds her anxiety has "become unmanageable." Patient reports a history of treatment for anxiety before pregnancy, was going to start something long acting but never did once she became pregnant. She reports that prior to pregnancy she had two episodes were she presented to the ER with changes in HR and BP with a negative work-up that were diagnosed as anxiety. She reports difficulty sleeping and insomnia. Her concern for her baby's well-being limits her ability to rest and causes her constant worrying. Patient reports eating daily and staying hydrated - currently drinking about 3 bottles of water a day. Patient has good social support at home  living with mom and husband.  Breast or formula feeding: formula feeding Intercourse: No  Contraception after delivery: undecided Any bowel or bladder issues: No  Post partum depression/anxiety noted:  Yes - patient reports daily anxiety surrounding infant well-being, patient reports adequate support at home from mother and significant other, feels she is coping ok at this time [place] Post-Partum Depression Score: 9 Date of last PAP: 02/2018  NILM  Review of Systems: Review of Systems  Constitutional: Positive for malaise/fatigue. Negative for fever.  HENT: Negative.   Eyes: Negative.   Respiratory: Negative for cough and shortness of breath.   Cardiovascular: Negative for chest pain, palpitations and leg swelling.  Gastrointestinal: Negative.   Genitourinary:       Vaginal bleed  Musculoskeletal: Negative.   Skin: Negative.   Neurological: Positive for dizziness and loss of consciousness.  Endo/Heme/Allergies: Negative.   Psychiatric/Behavioral: Positive for depression. Negative for suicidal ideas. The patient is nervous/anxious and has insomnia.     The following portions of the patient's history were reviewed and updated as appropriate: allergies, current medications, past family history, past medical history, past social history, past surgical history and problem list.  Past Medical History:  Past Medical History:  Diagnosis Date  . Hypertension     Past Surgical History:  Past Surgical History:  Procedure Laterality Date  . NO PAST SURGERIES      Family History:  Family History  Problem Relation Age of Onset  . Diabetes Mother        "Pre Diabetes"  . Healthy Sister   . Hypertension Maternal Grandmother   . Healthy Sister   .  Breast cancer Neg Hx   . Ovarian cancer Neg Hx   . Colon cancer Neg Hx     Social History:  Social History   Socioeconomic History  . Marital status: Married    Spouse name: Marden Noble   . Number of children: Not on file  . Years of  education: Not on file  . Highest education level: Not on file  Occupational History  . Not on file  Tobacco Use  . Smoking status: Never Smoker  . Smokeless tobacco: Never Used  Vaping Use  . Vaping Use: Never used  Substance and Sexual Activity  . Alcohol use: No  . Drug use: No  . Sexual activity: Not Currently    Partners: Male  Other Topics Concern  . Not on file  Social History Narrative  . Not on file   Social Determinants of Health   Financial Resource Strain: Not on file  Food Insecurity: Not on file  Transportation Needs: Not on file  Physical Activity: Not on file  Stress: Not on file  Social Connections: Not on file  Intimate Partner Violence: Not on file    Allergies:  Allergies  Allergen Reactions  . Zithromax [Azithromycin]     Respiratory Destress    Medications: Prior to Admission medications   Medication Sig Start Date End Date Taking? Authorizing Provider  escitalopram (LEXAPRO) 20 MG tablet Take 1 tablet (20 mg total) by mouth daily. Start by taking 1/5 tablet (10mg  total) by mouth daily for seven days. Then increase the dose to 1 tablet (20mg  total) by mouth daily. 12/05/20  Yes , Adeleine, CNM  metoprolol succinate (TOPROL XL) 25 MG 24 hr tablet Take 1 tablet (25 mg total) by mouth daily. 12/05/20  Yes , Angelyse, CNM  ibuprofen (ADVIL) 600 MG tablet Take 1 tablet (600 mg total) by mouth every 6 (six) hours. 11/21/20   Malachy Mood, MD  metoprolol succinate (TOPROL-XL) 50 MG 24 hr tablet Take by mouth daily. Take 1 tab BID 09/22/19 11/19/20  [provider]  Prenatal Vit-Fe Fumarate-FA (PRENATAL MULTIVITAMIN) TABS tablet Take 1 tablet by mouth daily at 12 noon.    [provider]  Specialty Vitamins Products (MAGNESIUM, AMINO ACID CHELATE,) 133 MG tablet Take 2 tablets by mouth 2 (two) times daily. Patient not taking: No sig reported    [provider]    Physical Exam Blood pressure 126/86, pulse 82, height 5\' 8"   (1.727 m), weight 235 lb (106.6 kg), unknown if currently breastfeeding.  Orthostatic blood pressures: lying - 118/86, sitting - 122/88, standing - 126/92   General: NAD HEENT: normocephalic, anicteric Pulmonary: No increased work of breathing Extremities: no edema, erythema, or tenderness Neurologic: Grossly intact Psychiatric: mood appropriate, affect full   Edinburgh Postnatal Depression Scale - 12/05/20 0933      Edinburgh Postnatal Depression Scale:  In the Past 7 Days   I have been able to laugh and see the funny side of things. 0    I have looked forward with enjoyment to things. 0    I have blamed myself unnecessarily when things went wrong. 3    I have been anxious or worried for no good reason. 3    I have felt scared or panicky for no good reason. 3    Things have been getting on top of me. 2    I have been so unhappy that I have had difficulty sleeping. 1    I have felt sad or miserable. 1  I have been so unhappy that I have been crying. 1    The thought of harming myself has occurred to me. 0    Edinburgh Postnatal Depression Scale Total 14           Assessment: 25 y.o. G2P1011 presenting for blood pressure check and mood check.  Plan: Problem List Items Addressed This Visit      Other   Postpartum care and examination    Other Visit Diagnoses    Vaginal bleeding    -  Primary   Relevant Orders   CBC   Blood pressure check       Postpartum depression       Relevant Medications   escitalopram (LEXAPRO) 20 MG tablet   Postpartum anxiety       Relevant Medications   escitalopram (LEXAPRO) 20 MG tablet   Chronic hypertension       Relevant Medications   metoprolol succinate (TOPROL XL) 25 MG 24 hr tablet   Brief loss of consciousness       Relevant Orders   EKG 12-Lead     1) Syncopal episodes - Normotensive today. Orthostatic blood pressures wnl. Will check CBC and obtain EKG. Plan made to decrease metoprolol XL to 25 mg daily. Patient to follow-up  with PCP.  2) Patient reporting increase sx of anxiety, reviewed EPDS results with patient. Discussed treatment options. Patient desires to start SSRI. Rx'd. Will follow-up with mood at six week postpartum visit.  4) Return in about 1 week (around 12/05/2020) for BP check.   Orlie Pollen, CNM, MSN [facility] 12/05/2020, 10:30 AM

## 2020-12-06 LAB — CBC
Hematocrit: 39.1 % (ref 34.0–46.6)
Hemoglobin: 13.1 g/dL (ref 11.1–15.9)
MCH: 32.8 pg (ref 26.6–33.0)
MCHC: 33.5 g/dL (ref 31.5–35.7)
MCV: 98 fL — ABNORMAL HIGH (ref 79–97)
Platelets: 318 10*3/uL (ref 150–450)
RBC: 3.99 x10E6/uL (ref 3.77–5.28)
RDW: 12.4 % (ref 11.7–15.4)
WBC: 7.3 10*3/uL (ref 3.4–10.8)

## 2020-12-12 ENCOUNTER — Ambulatory Visit: Payer: Managed Care, Other (non HMO) | Admitting: Obstetrics and Gynecology

## 2020-12-14 ENCOUNTER — Other Ambulatory Visit: Payer: Self-pay

## 2020-12-14 ENCOUNTER — Ambulatory Visit (INDEPENDENT_AMBULATORY_CARE_PROVIDER_SITE_OTHER): Payer: Managed Care, Other (non HMO) | Admitting: Obstetrics and Gynecology

## 2020-12-14 ENCOUNTER — Encounter: Payer: Self-pay | Admitting: Obstetrics and Gynecology

## 2020-12-14 DIAGNOSIS — O99345 Other mental disorders complicating the puerperium: Secondary | ICD-10-CM

## 2020-12-14 DIAGNOSIS — F418 Other specified anxiety disorders: Secondary | ICD-10-CM

## 2020-12-14 DIAGNOSIS — Z0130 Encounter for examination of blood pressure without abnormal findings: Secondary | ICD-10-CM

## 2020-12-14 NOTE — Progress Notes (Signed)
Patient ID: Brenda Osborn, female   DOB: Jun 19, 1996, 25 y.o.   MRN: [id number]  Reason for Consult: Follow-up (BP check, patient states she has been feeling much better saw PCP and medication changed, no syncopal episodes. RM 4)   Referred by Brenda Crouch, MD  Subjective:     HPI:  Brenda Osborn is a 25 y.o. female patient presents for follow-up for blood pressure check and for mood check. Patient was previously having issues with LOC (see visit note from 12/05/20). Patient has since seen PCP and her antihypertensive medication was changed. Patient is denies any additional concerns with LOC since this medication change. Patient reports a decrease in her level of anxiety and overall improvement in her mood. Patient reports physically feeling improvement since vaginal delivery. Denies additional concerns.  Gynecological History  LMP: has not resumed since pregnancy Last pap smear: 03/13/18 History of STDs: none Sexually Active: not since delivery  Obstetrical History G2P1011  Past Medical History:  Diagnosis Date  . Hypertension    Family History  Problem Relation Age of Onset  . Diabetes Mother        "Pre Diabetes"  . Healthy Sister   . Hypertension Maternal Grandmother   . Healthy Sister   . Breast cancer Neg Hx   . Ovarian cancer Neg Hx   . Colon cancer Neg Hx    Past Surgical History:  Procedure Laterality Date  . NO PAST SURGERIES      Short Social History:  Social History   Tobacco Use  . Smoking status: Never Smoker  . Smokeless tobacco: Never Used  Substance Use Topics  . Alcohol use: No    Allergies  Allergen Reactions  . Zithromax [Azithromycin]     Respiratory Destress    Current Outpatient Medications  Medication Sig Dispense Refill  . escitalopram (LEXAPRO) 20 MG tablet Take 1 tablet (20 mg total) by mouth daily. Start by taking 1/5 tablet (10mg  total) by mouth daily for seven days. Then increase the dose to 1 tablet (20mg  total) by  mouth daily. 30 tablet 3  . propranolol (INDERAL) 10 MG tablet Take 1 tablet by mouth 2 (two) times daily.    Marland Kitchen ibuprofen (ADVIL) 600 MG tablet Take 1 tablet (600 mg total) by mouth every 6 (six) hours. 30 tablet 0  . metoprolol succinate (TOPROL XL) 25 MG 24 hr tablet Take 1 tablet (25 mg total) by mouth daily. (Patient not taking: Reported on 12/14/2020) 30 tablet 1  . metoprolol succinate (TOPROL-XL) 50 MG 24 hr tablet Take by mouth daily. Take 1 tab BID    . Prenatal Vit-Fe Fumarate-FA (PRENATAL MULTIVITAMIN) TABS tablet Take 1 tablet by mouth daily at 12 noon.    Marland Kitchen Specialty Vitamins Products (MAGNESIUM, AMINO ACID CHELATE,) 133 MG tablet Take 2 tablets by mouth 2 (two) times daily. (Patient not taking: No sig reported)     No current facility-administered medications for this visit.    Review of Systems  Constitutional:  Constitutional negative. HENT: HENT negative.  Eyes: Eyes negative.  Respiratory: Respiratory negative.  Cardiovascular: Cardiovascular negative.  GI: Gastrointestinal negative.  GU: Genitourinary negative. Musculoskeletal: Musculoskeletal negative.  Skin: Skin negative.  Neurological: Negative for dizziness, light-headedness and syncope.  Hematologic: Hematologic/lymphatic negative.  Psychiatric: Negative for depressed mood.        Objective:  Objective   Vitals:   12/14/20 1452  BP: 122/78  Weight: 230 lb (104.3 kg)   Body mass index is 34.97 kg/m.  Physical Exam Constitutional:      Appearance: Normal appearance.  Pulmonary:     Effort: Pulmonary effort is normal.  Skin:    General: Skin is warm.  Neurological:     General: No focal deficit present.     Mental Status: She is alert and oriented to person, place, and time.  Psychiatric:        Mood and Affect: Mood normal.        Behavior: Behavior normal.       Edinburgh Postnatal Depression Scale - 12/14/20 1521      Edinburgh Postnatal Depression Scale:  In the Past 7 Days   I have  been able to laugh and see the funny side of things. 0    I have looked forward with enjoyment to things. 0    I have blamed myself unnecessarily when things went wrong. 2    I have been anxious or worried for no good reason. 2    I have felt scared or panicky for no good reason. 1    Things have been getting on top of me. 1    I have been so unhappy that I have had difficulty sleeping. 0    I have felt sad or miserable. 0    I have been so unhappy that I have been crying. 1    The thought of harming myself has occurred to me. 0    Edinburgh Postnatal Depression Scale Total 7            Assessment/Plan:    25 y.o. G2P1011 presenting for blood pressure and mood check. Blood pressure stable with recent medication change. Patient denies additional syncopal episodes. Mood improving after initiation of SSRI. Will continue to monitor response to medication at 6 week PP visit.  Problem List Items Addressed This Visit      Other   Postpartum care and examination - Primary    Other Visit Diagnoses    Blood pressure check       Postpartum anxiety             Orlie Pollen, [facility], [facility] 12/14/2020 3:30 PM

## 2021-01-03 ENCOUNTER — Ambulatory Visit: Payer: Managed Care, Other (non HMO) | Admitting: Obstetrics and Gynecology

## 2021-01-08 ENCOUNTER — Ambulatory Visit (INDEPENDENT_AMBULATORY_CARE_PROVIDER_SITE_OTHER): Payer: Managed Care, Other (non HMO) | Admitting: Obstetrics and Gynecology

## 2021-01-08 ENCOUNTER — Other Ambulatory Visit: Payer: Self-pay

## 2021-01-08 ENCOUNTER — Encounter: Payer: Self-pay | Admitting: Obstetrics and Gynecology

## 2021-01-08 ENCOUNTER — Other Ambulatory Visit (HOSPITAL_COMMUNITY)
Admission: RE | Admit: 2021-01-08 | Discharge: 2021-01-08 | Disposition: A | Discharge status: Home / Self Care | Payer: Managed Care, Other (non HMO) | Source: Ambulatory Visit | Attending: Obstetrics and Gynecology | Admitting: Obstetrics and Gynecology

## 2021-01-08 DIAGNOSIS — F418 Other specified anxiety disorders: Secondary | ICD-10-CM

## 2021-01-08 DIAGNOSIS — Z124 Encounter for screening for malignant neoplasm of cervix: Secondary | ICD-10-CM | POA: Diagnosis present

## 2021-01-08 DIAGNOSIS — F53 Postpartum depression: Secondary | ICD-10-CM

## 2021-01-08 DIAGNOSIS — O99345 Other mental disorders complicating the puerperium: Secondary | ICD-10-CM

## 2021-01-08 DIAGNOSIS — Z538 Procedure and treatment not carried out for other reasons: Secondary | ICD-10-CM

## 2021-01-08 DIAGNOSIS — I10 Essential (primary) hypertension: Secondary | ICD-10-CM

## 2021-01-08 DIAGNOSIS — Z1332 Encounter for screening for maternal depression: Secondary | ICD-10-CM

## 2021-01-08 DIAGNOSIS — Z3009 Encounter for other general counseling and advice on contraception: Secondary | ICD-10-CM

## 2021-01-08 MED ORDER — ESCITALOPRAM OXALATE 20 MG PO TABS: 30.0000 mg | ORAL_TABLET | Freq: Every day | ORAL | 3 refills | Status: DC

## 2021-01-08 NOTE — Progress Notes (Signed)
Postpartum Visit  Chief Complaint:  Chief Complaint  Patient presents with  . Postpartum Care  . Contraception    Interested in IUD    History of Present Illness: Patient is a 25 y.o. G2P1011 presents for postpartum visit. Date of delivery: 11/19/20 Type of delivery: Vaginal delivery - Vacuum or forceps assisted  no Episiotomy No.  Laceration: yes  Pregnancy or labor problems:  Yes - see prenatal record Any problems since the delivery:  Yes - syncopal episodes, PCP managing antihypertensive medication, anxiety/depression - started on Lexapro  Newborn Details:  SINGLETON :  1. BabyGender female. Birth weight:  2660 g Maternal Details:  Breast or formula feeding: plans to bottle feed Intercourse: No  Contraception after delivery: Desires IUD Any bowel or bladder issues: No  Post partum depression/anxiety noted:  yes Edinburgh Post-Partum Depression Score: 14 Date of last PAP: 02/2018  NILM   Review of Systems: Review of Systems  Constitutional: Negative.   HENT: Negative.   Eyes: Negative.   Respiratory: Negative.   Cardiovascular: Negative.   Gastrointestinal: Negative.   Genitourinary: Negative.   Musculoskeletal: Negative.   Skin: Negative.   Neurological: Negative.   Endo/Heme/Allergies: Negative.   Psychiatric/Behavioral: Positive for depression. Negative for suicidal ideas. The patient is nervous/anxious.     The following portions of the patient's history were reviewed and updated as appropriate: allergies, current medications, past family history, past medical history, past social history, past surgical history and problem list.  Past Medical History:  Past Medical History:  Diagnosis Date  . Hypertension     Past Surgical History:  Past Surgical History:  Procedure Laterality Date  . NO PAST SURGERIES      Family History:  Family History  Problem Relation Age of Onset  . Diabetes Mother        "Pre Diabetes"  . Healthy Sister   .  Hypertension Maternal Grandmother   . Healthy Sister   . Breast cancer Neg Hx   . Ovarian cancer Neg Hx   . Colon cancer Neg Hx     Social History:  Social History   Socioeconomic History  . Marital status: Married    Spouse name: Marden Noble   . Number of children: Not on file  . Years of education: Not on file  . Highest education level: Not on file  Occupational History  . Not on file  Tobacco Use  . Smoking status: Never Smoker  . Smokeless tobacco: Never Used  Vaping Use  . Vaping Use: Never used  Substance and Sexual Activity  . Alcohol use: No  . Drug use: No  . Sexual activity: Not Currently    Partners: Male    Birth control/protection: None  Other Topics Concern  . Not on file  Social History Narrative  . Not on file   Social Determinants of Health   Financial Resource Strain: Not on file  Food Insecurity: Not on file  Transportation Needs: Not on file  Physical Activity: Not on file  Stress: Not on file  Social Connections: Not on file  Intimate Partner Violence: Not on file    Allergies:  Allergies  Allergen Reactions  . Zithromax [Azithromycin]     Respiratory Destress    Medications: Prior to Admission medications   Medication Sig Start Date End Date Taking? Authorizing Provider  escitalopram (LEXAPRO) 20 MG tablet Take 1 tablet (20 mg total) by mouth daily. Start by taking 1/5 tablet (10mg  total) by mouth daily for  seven days. Then increase the dose to 1 tablet (20mg  total) by mouth daily. 12/05/20  Yes , Jazaria, CNM  propranolol (INDERAL) 10 MG tablet Take 1 tablet by mouth 2 (two) times daily. 12/05/20 12/05/21 Yes [provider]    Physical Exam Blood pressure 100/80, height 5\' 8"  (1.727 m), weight 234 lb (106.1 kg), last menstrual period 12/25/2020, not currently breastfeeding. General: NAD HEENT: normocephalic, anicteric Pulmonary: No increased work of breathing Abdomen: NABS, soft, non-tender, non-distended.  Umbilicus without  lesions.  No hepatomegaly, splenomegaly or masses palpable. No evidence of hernia. Genitourinary:  External: Normal external female genitalia.  Normal urethral meatus, normal  Bartholin's and Skene's glands.    Vagina: Normal vaginal mucosa, no evidence of prolapse.    Cervix: Grossly normal in appearance, no bleeding  Bimanual exam deferred  Rectal: deferred Extremities: no edema, erythema, or tenderness Neurologic: Grossly intact Psychiatric: mood appropriate, affect full   Attempt to place IUD Procedure Note Patient identified, informed consent performed, consent signed.   Discussed risks of irregular bleeding, cramping, infection, malpositioning or misplacement of the IUD outside the uterus which may require further procedure such as laparoscopy, risk of failure <1%. Time out was performed.  Urine pregnancy test negative.  A bimanual exam showed the uterus to be midposition.  Speculum placed in the vagina.  Cervix visualized.  Cleaned with Betadine x 2.  Grasped anteriorly with a single tooth tenaculum.  Difficulty sounded uterus with significant resistance noted - unable to proceed with procedure today.   Assessment: 25 y.o. G2P1011 presenting for 6 week postpartum visit, attempted IUD insertion unsuccessful  Plan: Problem List Items Addressed This Visit      Other   Postpartum care and examination - Primary    Other Visit Diagnoses    Postpartum anxiety       Relevant Medications   escitalopram (LEXAPRO) 20 MG tablet   Postpartum depression       Relevant Medications   escitalopram (LEXAPRO) 20 MG tablet   Chronic hypertension       Encounter for screening for maternal depression       General counseling and advice for contraceptive management       Screening for cervical cancer       Relevant Orders   Cytology - PAP (Completed)   Unsuccessful insertion of intrauterine device (IUD)          1) Contraception - Education given regarding options for contraception, as  well as compatibility with breast feeding if applicable.  Patient plans on IUD for contraception. Placement attempt unsuccessful today - will refer to return to see MD for placement.  2)  Pap - ASCCP guidelines and rational discussed.  ASCCP guidelines and rational discussed.  Patient opts for every 3 years screening interval - collected today  3) Patient underwent screening for postpartum depression with concern for depression - patient had been previously started on Lexapro to good effect, initially felt a significant improvement in symptoms - given initial improvement - will titrate dose to 30 mg (according to UpToDate, reasonable appraoch)-- if titration is unsuccesful will plan on attempting a different SSRI  4) Return for ASAP for IUD placement with MD (whenever the patient desires); phone medication follow-up in 4 weeks.   Orlie Pollen, CNM, MSN [facility], [facility] 01/08/2021, 2:17 PM

## 2021-01-08 NOTE — Patient Instructions (Signed)
Do not make changes to your medications, including taking more or less than prescribed, unless under the supervision of your provider. Be aware that some medications may make you feel worse if abruptly stopped.  ++LOCAL WALK-IN CLINICS and 24/7 CRISIS CENTER++  [facility], 24/7 Address: [address], [place], [place] [address]  Phone: [phone]  ++[place]++ --[facility], 24/7 [place]  (24 hours a day)  --[place] Outreach: Psychiatric Urgent Care  Ages 4 and over with psychiatric or substance use crisis [address] [place], [place] Monday - Thursday: 8:00am - 7:00pm; Friday: 8:00am - 3:00pm  Saturday: 9:00am - 12:00pm, CLOSED Sundays [place], medicaid from [place], [place], [place], [place]  ++[place]++ Crisis and Assessment, 24/7 [place] at [place]  (24 hours a day)  ++[facility] [employer] [facility], [place], [place]  (Monday-Friday, 8:00am-5:00pm)  ++[place]++ [place] at [facility] [place]  (7 days a week, 8:00am-10:00pm)  ++MOBILE CRISIS ++ The Mobile Crisis Response Team provides a number of integrated services including: . Evaluation of immediate need for crisis/emergency services . Crisis intervention and assistance in de-escalating the situation . Risk assessment and evaluation for more intensive services  . Short-term crisis management until other supports/services are in place  . Liaison between individual, families, providers and emergency services  . Coordination of appropriate services for ongoing treatment and follow up  [employer]:  1) [place] via [place]  2) [place], [place], [place] and [facility] Access by [place]: [phone]  2) [place] via Psycho-therapeutic [phone]  Suicide & Crisis Hotlines  The Hopeline   Call or text: 445-003-5105  Online chat: www.hopeline.com  [employer] Network: 1-800-SUICIDE / 724-653-2176  National Suicide Prevention Lifeline (347)770-4274 / 6510250256  Online chat: www.suicidepreventionlifeline.org  Substance Abuse Treatment  Alcohol/Drug Council of [place] 4-081-448-1856  http://www.MonthlyTracker.ca   Substance Abuse and Mental Health Services Administration [facility]) treatment locator verticolony.com.aspx   [facility]'s Alcohol and Substance Abuse Program (ASAP) All major health insurance, Medicare, and Medicaid are accepted [phone] www.[facility].org/[facility]/care-treatment/alcohol-and-substance-abuse/  [place]: 236-105-1012  http://freedomhouserecovery.org/   [place]: [phone]  Mobile Crisis in [place]: ToyTutorials.es -- search for your county  [place]: ChemicalAttorney.at  [facility] Psychiatry: The [facility] (ADTC) is a general adult psychiatric clinic staffed by teams of faculty and resident psychiatrists. For appointments, please call [phone].   [place] [facility]: To obtain an appointment for your child to be seen in our clinic, please call [phone].   [place] [facility] Outpatient Program, [facility] Psychiatry Pediatric Behavior [facility], [facility] and [facility]: To obtain an appointment at one of these clinics, please call [phone].   [place] for Eating Disorders: [phone]   [facility]'s Mood Disorders: [phone]  How to [place] If you have health insurance,  look on your insurance card for the phone number or website to access a list of mental health providers who accept your insurance.  If you have Medicaid, Medicare or no insurance, see below for your county's MCO. Call the access number to schedule an appointment.   For the most up-to-date information, please visit:  SecurityWorkshops.gl  [facility] served [place] 24/7 Access: [phone]  [email or web address] 24/7 Access:  [phone]  [email or web address] 24/7 Access: [phone]  [email or web address]  [place] Access: [phone]  [place]: [phone]  [email or web address]  [employer] [facility] [place] 24/7 Access: [phone]  [email or web address] 24/7 Access: [phone]  [email or web address] 24/7 Access: [phone]  [email or web address]

## 2021-01-10 LAB — CYTOLOGY - PAP
Adequacy: ABSENT
Diagnosis: NEGATIVE

## 2021-01-22 ENCOUNTER — Ambulatory Visit: Payer: Managed Care, Other (non HMO) | Admitting: Obstetrics and Gynecology

## 2021-02-05 ENCOUNTER — Ambulatory Visit: Payer: Managed Care, Other (non HMO) | Admitting: Obstetrics and Gynecology

## 2021-09-24 DIAGNOSIS — K8050 Calculus of bile duct without cholangitis or cholecystitis without obstruction: Secondary | ICD-10-CM | POA: Insufficient documentation

## 2021-09-24 HISTORY — DX: Calculus of bile duct without cholangitis or cholecystitis without obstruction: K80.50

## 2021-09-27 ENCOUNTER — Ambulatory Visit: Payer: Self-pay | Admitting: Surgery

## 2021-09-27 NOTE — H&P (View-Only) (Signed)
coffeSubjective: ° °CC: Cholecystitis, acute [K81.0] ° °HPI: ° Brenda Osborn is a 25 y.o. female who was referred by Jeffrey D Sparks, MD for evaluation of above CC. Symptoms were first noted 1 week ago. Pain is sharp, confined to the right upper quadrant, without radiation.  Associated with N/V, exacerbated by nothing specific but unable to eat any solids for few days. ° °Workup at [facility] consistent with acute cholecystitis. Labs were improving so sent home to schedule elective lap chole, but patient states her nausea is persistent and pain never really improved ° °    °Past Medical History:  has a past medical history of Anxiety, GERD (gastroesophageal reflux disease), and Hypertension. ° °Past Surgical History:  has no past surgical history on file. ° °Family History: family history is not on file. ° °Social History:  reports that she has never smoked. She has never used smokeless tobacco. She reports that she does not currently use alcohol. She reports that she does not use drugs. ° °Current Medications: has a current medication list which includes the following prescription(s): butalbital-acetaminophen-caffeine, escitalopram oxalate, propranolol, triamcinolone, benzonatate, and buspirone. ° °Allergies:  °Allergies as of 09/27/2021 - Reviewed 09/27/2021 °Allergen Reaction Noted ° Azithromycin Other (See Comments) 04/03/2017 ° ° °ROS:  °A 15 point review of systems was performed and pertinent positives and negatives noted in HPI  °  °Objective: °  ° °BP 122/85    Pulse 81    Ht 172.7 cm (5' 8")    Wt (!) 110.2 kg (243 lb)    BMI 36.95 kg/m²  ° ° °Constitutional :  No distress, cooperative, alert °Lymphatics/Throat:  Supple with no lymphadenopathy °Respiratory:  Clear to auscultation bilaterally °Cardiovascular:  Regular rate and rhythm °Gastrointestinal: Soft, non-distended, focal TTP in RUQ, no organomegaly. °Musculoskeletal: Steady gait and movement °Skin: Cool and moist, no surgical  scars °Psychiatric: Normal affect, non-agitated, not confused °  °  °LABS:  °Sodium 135 - 145 mmol/L 141   °Potassium 3.4 - 4.8 mmol/L 4.0   °Chloride 98 - 107 mmol/L 107   °CO2 20.0 - 31.0 mmol/L 26.7   °Anion Gap 5 - 14 mmol/L 7   °BUN 9 - 23 mg/dL 11   °Creatinine 0.60 - 0.80 mg/dL 0.62   °BUN/Creatinine Ratio  18   °eGFR CKD-EPI (2021) Female >=60 mL/min/1.73m2 >90  eGFR calculated with CKD-EPI 2021 equation in accordance with National Kidney Foundation and American Society of Nephrology Task Force recommendations. °Glucose 70 - 179 mg/dL 81   °Calcium 8.7 - 10.4 mg/dL 9.2   °Albumin 3.4 - 5.0 g/dL 3.7   °Total Protein 5.7 - 8.2 g/dL 7.1   °Total Bilirubin 0.3 - 1.2 mg/dL 1.1   °AST <=34 U/L 123 High    °ALT 10 - 49 U/L 427 High    °Alkaline Phosphatase 46 - 116 U/L 235 High    ° °  °WBC 3.6 - 11.2 10*9/L 6.7  °RBC 3.95 - 5.13 10*12/L 3.90 Low   °HGB 11.3 - 14.9 g/dL 12.6  °HCT 34.0 - 44.0 % 36.6  °MCV 77.6 - 95.7 fL 93.6  °MCH 25.9 - 32.4 pg 32.1  °MCHC 32.0 - 36.0 g/dL 34.3  °RDW 12.2 - 15.2 % 12.4  °MPV 6.8 - 10.7 fL 8.9  °Platelet 150 - 450 10*9/L 229  °Resulting Agency  [facility] °Specimen Collected: 09/25/21 07:54 Last Resulted: 09/25/21 08:14 °Received From: [facility]  Result Received: 09/27/21 09:14 ° ° ° °RADS: °-- Cholelithiasis with diffuse   gallbladder wall thickening and probable stone within the cystic duct. No MRI evidence of choledocholithiasis. The common bile duct is normal in caliber. °Narrative ° °EXAM: MRI abdomen with and without contrast, MRCP  °DATE: 09/25/2021 11:32 AM  °ACCESSION: [id number]  °DICTATED: 09/25/2021 12:17 PM  °INTERPRETATION LOCATION: [facility]  ° °CLINICAL INDICATION: 25 years old Female with cholelithiasis    ° °COMPARISON: Right upper quadrant ultrasound dated 09/24/2021  ° °TECHNIQUE: MRI of the abdomen was obtained with and without IV contrast.  Multisequence, multiplanar and dedicated T2 weighted images that highlight the biliary  tree were obtained.  ° °FINDINGS:  ° °LOWER CHEST: Unremarkable.  ° °ABDOMEN:  ° °HEPATOBILIARY: Unremarkable liver. No extrahepatic or intrahepatic biliary ductal dilatation. Trace cholelithiasis with probable stone in the cystic duct (12:42). Diffuse gallbladder wall thickening (8:11).  ° °PANCREAS: Unremarkable.  °SPLEEN: Unremarkable.  ° °ADRENAL GLANDS: Unremarkable.  °KIDNEYS/URETERS: Kidneys enhance symmetrically. No hydronephrosis.  ° °BOWEL: No bowel obstruction. No acute inflammatory process. No ascites.  ° °PERITONEUM/RETROPERITONEUM: No free air or free fluid.  ° °VASCULATURE: Abdominal aorta within normal limits for patient's age. Unremarkable inferior vena cava.  °LYMPH NODES: No adenopathy.   ° °Assessment: ° °   °Cholecystitis, acute [K81.0] ° °Plan: ° °  °1. Cholecystitis, acute [K81.0] °Discussed the risk of surgery including post-op infxn, seroma, biloma, chronic pain, poor-delayed wound healing, retained gallstone, conversion to open procedure, post-op SBO or ileus, and need for additional procedures to address said risks.  The risks of general anesthetic including MI, CVA, sudden death or even reaction to anesthetic medications also discussed. Alternatives include continued observation.  Benefits include possible symptom relief, prevention of complications including acute cholecystitis, pancreatitis. ° °Typical post operative recovery of 3-5 days rest, continued pain in area and incision sites, possible loose stools up to 4-6 weeks, also discussed. ° °ED return precautions given for sudden increase in RUQ pain, with possible accompanying fever, nausea, and/or vomiting. ° °The patient understands the risks, any and all questions were answered to the patient's satisfaction. ° °2. Patient has elected to proceed with surgical treatment. Procedure will be scheduled. robotic assisted laparoscopic ° ° °

## 2021-09-27 NOTE — H&P (Signed)
coffeSubjective:  CC: Cholecystitis, acute [K81.0]  HPI:  Brenda Osborn is a 25 y.o. female who was referred by Idelle Crouch, MD for evaluation of above CC. Symptoms were first noted 1 week ago. Pain is sharp, confined to the right upper quadrant, without radiation.  Associated with N/V, exacerbated by nothing specific but unable to eat any solids for few days.  Workup at [facility] consistent with acute cholecystitis. Labs were improving so sent home to schedule elective lap chole, but patient states her nausea is persistent and pain never really improved      Past Medical History:  has a past medical history of Anxiety, GERD (gastroesophageal reflux disease), and Hypertension.  Past Surgical History:  has no past surgical history on file.  Family History: family history is not on file.  Social History:  reports that she has never smoked. She has never used smokeless tobacco. She reports that she does not currently use alcohol. She reports that she does not use drugs.  Current Medications: has a current medication list which includes the following prescription(s): butalbital-acetaminophen-caffeine, escitalopram oxalate, propranolol, triamcinolone, benzonatate, and buspirone.  Allergies:  Allergies as of 09/27/2021 - Reviewed 09/27/2021 Allergen Reaction Noted  Azithromycin Other (See Comments) 04/03/2017   ROS:  A 15 point review of systems was performed and pertinent positives and negatives noted in HPI    Objective:    BP 122/85    Pulse 81    Ht 172.7 cm (_0 )    Wt (!) 110.2 kg (243 lb)    BMI 36.95 kg/m    Constitutional :  No distress, cooperative, alert Lymphatics/Throat:  Supple with no lymphadenopathy Respiratory:  Clear to auscultation bilaterally Cardiovascular:  Regular rate and rhythm Gastrointestinal: Soft, non-distended, focal TTP in RUQ, no organomegaly. Musculoskeletal: Steady gait and movement Skin: Cool and moist, no surgical  scars Psychiatric: Normal affect, non-agitated, not confused     LABS:  Sodium 135 - 145 mmol/L 141   Potassium 3.4 - 4.8 mmol/L 4.0   Chloride 98 - 107 mmol/L 107   CO2 20.0 - 31.0 mmol/L 26.7   Anion Gap 5 - 14 mmol/L 7   BUN 9 - 23 mg/dL 11   Creatinine 0.60 - 0.80 mg/dL 0.62   BUN/Creatinine Ratio  18   eGFR CKD-EPI (2021) Female >=60 mL/min/1.80m >90  eGFR calculated with CKD-EPI 2021 equation in accordance with [employer] Task [employer] recommendations. Glucose 70 - 179 mg/dL 81   Calcium 8.7 - 10.4 mg/dL 9.2   Albumin 3.4 - 5.0 g/dL 3.7   Total Protein 5.7 - 8.2 g/dL 7.1   Total Bilirubin 0.3 - 1.2 mg/dL 1.1   AST <=34 U/L 123 High    ALT 10 - 49 U/L 427 High    Alkaline Phosphatase 46 - 116 U/L 235 High       WBC 3.6 - 11.2 10*9/L 6.7  RBC 3.95 - 5.13 10*12/L 3.90 Low   HGB 11.3 - 14.9 g/dL 12.6  HCT 34.0 - 44.0 % 36.6  MCV 77.6 - 95.7 fL 93.6  MCH 25.9 - 32.4 pg 32.1  MCHC 32.0 - 36.0 g/dL 34.3  RDW 12.2 - 15.2 % 12.4  MPV 6.8 - 10.7 fL 8.9  Platelet 150 - 450 10*9/L 229  [facility] Specimen Collected: 09/25/21 07:54 Last Resulted: 09/25/21 08:14 [place] Received: 09/27/21 09:14    RADS: -- Cholelithiasis with diffuse  gallbladder wall thickening and probable stone within the cystic duct. No MRI evidence of choledocholithiasis. The common bile duct is normal in caliber. Narrative  EXAM: MRI abdomen with and without contrast, MRCP  DATE: 09/25/2021 11:32 AM  ACCESSION: [id number]  DICTATED: 09/25/2021 12:17 PM  INTERPRETATION LOCATION: [place]   CLINICAL INDICATION: 25 years old Female with cholelithiasis     COMPARISON: Right upper quadrant ultrasound dated 09/24/2021   TECHNIQUE: MRI of the abdomen was obtained with and without IV contrast.  Multisequence, multiplanar and dedicated T2 weighted images that highlight the biliary  tree were obtained.   FINDINGS:   LOWER CHEST: Unremarkable.   ABDOMEN:   HEPATOBILIARY: Unremarkable liver. No extrahepatic or intrahepatic biliary ductal dilatation. Trace cholelithiasis with probable stone in the cystic duct (12:42). Diffuse gallbladder wall thickening (8:11).   PANCREAS: Unremarkable.  SPLEEN: Unremarkable.   ADRENAL GLANDS: Unremarkable.  KIDNEYS/URETERS: Kidneys enhance symmetrically. No hydronephrosis.   BOWEL: No bowel obstruction. No acute inflammatory process. No ascites.   PERITONEUM/RETROPERITONEUM: No free air or free fluid.   VASCULATURE: Abdominal aorta within normal limits for patient's age. Unremarkable inferior vena cava.  LYMPH NODES: No adenopathy.    Assessment:     Cholecystitis, acute [K81.0]  Plan:    1. Cholecystitis, acute [K81.0] Discussed the risk of surgery including post-op infxn, seroma, biloma, chronic pain, poor-delayed wound healing, retained gallstone, conversion to open procedure, post-op SBO or ileus, and need for additional procedures to address said risks.  The risks of general anesthetic including MI, CVA, sudden death or even reaction to anesthetic medications also discussed. Alternatives include continued observation.  Benefits include possible symptom relief, prevention of complications including acute cholecystitis, pancreatitis.  Typical post operative recovery of 3-5 days rest, continued pain in area and incision sites, possible loose stools up to 4-6 weeks, also discussed.  ED return precautions given for sudden increase in RUQ pain, with possible accompanying fever, nausea, and/or vomiting.  The patient understands the risks, any and all questions were answered to the patient's satisfaction.  2. Patient has elected to proceed with surgical treatment. Procedure will be scheduled. robotic assisted laparoscopic

## 2021-09-28 ENCOUNTER — Encounter: Payer: Self-pay | Admitting: Surgery

## 2021-09-28 ENCOUNTER — Other Ambulatory Visit: Payer: Self-pay

## 2021-09-28 ENCOUNTER — Encounter
Admission: RE | Disposition: A | Discharge status: Home / Self Care | Payer: Self-pay | Source: Home / Self Care | Attending: Surgery

## 2021-09-28 ENCOUNTER — Ambulatory Visit: Payer: Managed Care, Other (non HMO) | Admitting: Anesthesiology

## 2021-09-28 ENCOUNTER — Ambulatory Visit
Admission: RE | Admit: 2021-09-28 | Discharge: 2021-09-28 | Disposition: A | Discharge status: Home / Self Care | Payer: Managed Care, Other (non HMO) | Attending: Surgery | Admitting: Surgery

## 2021-09-28 DIAGNOSIS — K810 Acute cholecystitis: Secondary | ICD-10-CM | POA: Diagnosis present

## 2021-09-28 DIAGNOSIS — K8010 Calculus of gallbladder with chronic cholecystitis without obstruction: Secondary | ICD-10-CM | POA: Insufficient documentation

## 2021-09-28 LAB — POCT PREGNANCY, URINE: Preg Test, Ur: NEGATIVE

## 2021-09-28 SURGERY — CHOLECYSTECTOMY, ROBOT-ASSISTED, LAPAROSCOPIC
Anesthesia: General | Site: Abdomen

## 2021-09-28 MED ORDER — LACTATED RINGERS IV SOLN: INTRAVENOUS | Status: DC

## 2021-09-28 MED ORDER — ORAL CARE MOUTH RINSE: 15.0000 mL | Freq: Once | OROMUCOSAL | Status: AC

## 2021-09-28 MED ORDER — BUPIVACAINE HCL (PF) 0.5 % IJ SOLN
INTRAMUSCULAR | Status: AC
  Filled 2021-09-28: qty 30

## 2021-09-28 MED ORDER — FENTANYL CITRATE (PF) 100 MCG/2ML IJ SOLN
INTRAMUSCULAR | Status: AC
  Filled 2021-09-28: qty 2

## 2021-09-28 MED ORDER — IBUPROFEN 800 MG PO TABS: 800.0000 mg | ORAL_TABLET | Freq: Three times a day (TID) | ORAL | 0 refills | Status: DC | PRN

## 2021-09-28 MED ORDER — CEFAZOLIN SODIUM-DEXTROSE 2-4 GM/100ML-% IV SOLN
2.0000 g | INTRAVENOUS | Status: AC
  Administered 2021-09-28: 09:00:00 2 g via INTRAVENOUS

## 2021-09-28 MED ORDER — ROCURONIUM BROMIDE 10 MG/ML (PF) SYRINGE
PREFILLED_SYRINGE | INTRAVENOUS | Status: AC
  Filled 2021-09-28: qty 10

## 2021-09-28 MED ORDER — ONDANSETRON HCL 4 MG/2ML IJ SOLN: 4.0000 mg | Freq: Once | INTRAMUSCULAR | Status: DC | PRN

## 2021-09-28 MED ORDER — SUCCINYLCHOLINE CHLORIDE 200 MG/10ML IV SOSY
PREFILLED_SYRINGE | INTRAVENOUS | Status: AC
  Filled 2021-09-28: qty 10

## 2021-09-28 MED ORDER — PROPOFOL 10 MG/ML IV BOLUS
INTRAVENOUS | Status: AC
  Filled 2021-09-28: qty 20

## 2021-09-28 MED ORDER — OXYCODONE HCL 5 MG/5ML PO SOLN: 5.0000 mg | Freq: Once | ORAL | Status: AC | PRN

## 2021-09-28 MED ORDER — GABAPENTIN 300 MG PO CAPS: 300.0000 mg | ORAL_CAPSULE | ORAL | Status: AC

## 2021-09-28 MED ORDER — CELECOXIB 200 MG PO CAPS
ORAL_CAPSULE | ORAL | Status: AC
  Administered 2021-09-28: 08:00:00 200 mg via ORAL
  Filled 2021-09-28: qty 1

## 2021-09-28 MED ORDER — ACETAMINOPHEN 500 MG PO TABS
ORAL_TABLET | ORAL | Status: AC
  Administered 2021-09-28: 08:00:00 1000 mg via ORAL
  Filled 2021-09-28: qty 2

## 2021-09-28 MED ORDER — CHLORHEXIDINE GLUCONATE CLOTH 2 % EX PADS: 6.0000 | MEDICATED_PAD | Freq: Once | CUTANEOUS | Status: DC

## 2021-09-28 MED ORDER — ONDANSETRON HCL 4 MG/2ML IJ SOLN
INTRAMUSCULAR | Status: AC
  Filled 2021-09-28: qty 2

## 2021-09-28 MED ORDER — ACETAMINOPHEN 10 MG/ML IV SOLN: 1000.0000 mg | Freq: Once | INTRAVENOUS | Status: DC | PRN

## 2021-09-28 MED ORDER — MIDAZOLAM HCL 2 MG/2ML IJ SOLN
INTRAMUSCULAR | Status: AC
  Filled 2021-09-28: qty 2

## 2021-09-28 MED ORDER — ACETAMINOPHEN 325 MG PO TABS: 650.0000 mg | ORAL_TABLET | Freq: Three times a day (TID) | ORAL | 0 refills | Status: AC | PRN

## 2021-09-28 MED ORDER — OXYCODONE HCL 5 MG PO TABS
ORAL_TABLET | ORAL | Status: AC
  Filled 2021-09-28: qty 1

## 2021-09-28 MED ORDER — HYDROCODONE-ACETAMINOPHEN 5-325 MG PO TABS
1.0000 | ORAL_TABLET | Freq: Four times a day (QID) | ORAL | 0 refills | Status: DC | PRN
Start: 2021-09-28 — End: 2022-12-16

## 2021-09-28 MED ORDER — LIDOCAINE-EPINEPHRINE (PF) 1 %-1:200000 IJ SOLN
INTRAMUSCULAR | Status: DC | PRN
  Administered 2021-09-28: 20 mL via SUBCUTANEOUS

## 2021-09-28 MED ORDER — SUGAMMADEX SODIUM 200 MG/2ML IV SOLN
INTRAVENOUS | Status: DC | PRN
  Administered 2021-09-28: 200 mg via INTRAVENOUS

## 2021-09-28 MED ORDER — FENTANYL CITRATE (PF) 100 MCG/2ML IJ SOLN
INTRAMUSCULAR | Status: DC | PRN
  Administered 2021-09-28: 100 ug via INTRAVENOUS

## 2021-09-28 MED ORDER — CELECOXIB 200 MG PO CAPS: 200.0000 mg | ORAL_CAPSULE | ORAL | Status: AC

## 2021-09-28 MED ORDER — CHLORHEXIDINE GLUCONATE 0.12 % MT SOLN
OROMUCOSAL | Status: AC
  Administered 2021-09-28: 08:00:00 15 mL via OROMUCOSAL
  Filled 2021-09-28: qty 15

## 2021-09-28 MED ORDER — PROPOFOL 10 MG/ML IV BOLUS
INTRAVENOUS | Status: DC | PRN
  Administered 2021-09-28: 200 mg via INTRAVENOUS

## 2021-09-28 MED ORDER — ACETAMINOPHEN 500 MG PO TABS: 1000.0000 mg | ORAL_TABLET | ORAL | Status: AC

## 2021-09-28 MED ORDER — ROCURONIUM BROMIDE 100 MG/10ML IV SOLN
INTRAVENOUS | Status: DC | PRN
  Administered 2021-09-28: 50 mg via INTRAVENOUS

## 2021-09-28 MED ORDER — DEXAMETHASONE SODIUM PHOSPHATE 10 MG/ML IJ SOLN
INTRAMUSCULAR | Status: AC
  Filled 2021-09-28: qty 1

## 2021-09-28 MED ORDER — LIDOCAINE-EPINEPHRINE 1 %-1:100000 IJ SOLN
INTRAMUSCULAR | Status: AC
  Filled 2021-09-28: qty 1

## 2021-09-28 MED ORDER — 0.9 % SODIUM CHLORIDE (POUR BTL) OPTIME
TOPICAL | Status: DC | PRN
  Administered 2021-09-28: 09:00:00 500 mL

## 2021-09-28 MED ORDER — DEXAMETHASONE SODIUM PHOSPHATE 10 MG/ML IJ SOLN
INTRAMUSCULAR | Status: DC | PRN
  Administered 2021-09-28: 10 mg via INTRAVENOUS

## 2021-09-28 MED ORDER — CHLORHEXIDINE GLUCONATE 0.12 % MT SOLN: 15.0000 mL | Freq: Once | OROMUCOSAL | Status: AC

## 2021-09-28 MED ORDER — MIDAZOLAM HCL 2 MG/2ML IJ SOLN
INTRAMUSCULAR | Status: DC | PRN
  Administered 2021-09-28: 2 mg via INTRAVENOUS

## 2021-09-28 MED ORDER — OXYCODONE HCL 5 MG PO TABS
5.0000 mg | ORAL_TABLET | Freq: Once | ORAL | Status: AC | PRN
  Administered 2021-09-28: 11:00:00 5 mg via ORAL

## 2021-09-28 MED ORDER — FENTANYL CITRATE (PF) 100 MCG/2ML IJ SOLN
25.0000 ug | INTRAMUSCULAR | Status: DC | PRN
  Administered 2021-09-28 (×3): 50 ug via INTRAVENOUS

## 2021-09-28 MED ORDER — INDOCYANINE GREEN 25 MG IV SOLR
1.2500 mg | Freq: Once | INTRAVENOUS | Status: AC
  Administered 2021-09-28: 08:00:00 1.25 mg via INTRAVENOUS
  Filled 2021-09-28: qty 0.5

## 2021-09-28 MED ORDER — CEFAZOLIN SODIUM-DEXTROSE 2-4 GM/100ML-% IV SOLN
INTRAVENOUS | Status: AC
  Filled 2021-09-28: qty 100

## 2021-09-28 MED ORDER — GABAPENTIN 300 MG PO CAPS
ORAL_CAPSULE | ORAL | Status: AC
  Administered 2021-09-28: 08:00:00 300 mg via ORAL
  Filled 2021-09-28: qty 1

## 2021-09-28 MED ORDER — DOCUSATE SODIUM 100 MG PO CAPS: 100.0000 mg | ORAL_CAPSULE | Freq: Two times a day (BID) | ORAL | 0 refills | Status: AC | PRN

## 2021-09-28 MED ORDER — LIDOCAINE HCL (PF) 2 % IJ SOLN
INTRAMUSCULAR | Status: AC
  Filled 2021-09-28: qty 5

## 2021-09-28 MED ORDER — ONDANSETRON HCL 4 MG/2ML IJ SOLN
INTRAMUSCULAR | Status: DC | PRN
  Administered 2021-09-28: 4 mg via INTRAVENOUS

## 2021-09-28 MED ORDER — LIDOCAINE HCL (CARDIAC) PF 100 MG/5ML IV SOSY
PREFILLED_SYRINGE | INTRAVENOUS | Status: DC | PRN
  Administered 2021-09-28: 100 mg via INTRAVENOUS

## 2021-09-28 SURGICAL SUPPLY — 56 items
ANCHOR TIS RET SYS 235ML (MISCELLANEOUS) ×2 IMPLANT
BAG INFUSER PRESSURE 100CC (MISCELLANEOUS) IMPLANT
BLADE SURG SZ11 CARB STEEL (BLADE) ×2 IMPLANT
CANNULA REDUC XI 12-8 STAPL (CANNULA) ×1
CANNULA REDUCER 12-8 DVNC XI (CANNULA) ×1 IMPLANT
CHLORAPREP W/TINT 26 (MISCELLANEOUS) ×2 IMPLANT
CLIP LIGATING HEMO O LOK GREEN (MISCELLANEOUS) ×2 IMPLANT
COVER TIP SHEARS 8 DVNC (MISCELLANEOUS) IMPLANT
COVER TIP SHEARS 8MM DA VINCI (MISCELLANEOUS)
DECANTER SPIKE VIAL GLASS SM (MISCELLANEOUS) ×4 IMPLANT
DEFOGGER SCOPE WARMER CLEARIFY (MISCELLANEOUS) ×2 IMPLANT
DERMABOND ADVANCED (GAUZE/BANDAGES/DRESSINGS) ×1
DERMABOND ADVANCED .7 DNX12 (GAUZE/BANDAGES/DRESSINGS) ×1 IMPLANT
DRAPE ARM DVNC X/XI (DISPOSABLE) ×4 IMPLANT
DRAPE C-ARM XRAY 36X54 (DRAPES) IMPLANT
DRAPE COLUMN DVNC XI (DISPOSABLE) ×1 IMPLANT
DRAPE DA VINCI XI ARM (DISPOSABLE) ×4
DRAPE DA VINCI XI COLUMN (DISPOSABLE) ×1
ELECT CAUTERY BLADE 6.4 (BLADE) ×2 IMPLANT
ELECT REM PT RETURN 9FT ADLT (ELECTROSURGICAL) ×2
ELECTRODE REM PT RTRN 9FT ADLT (ELECTROSURGICAL) ×1 IMPLANT
GAUZE 4X4 16PLY ~~LOC~~+RFID DBL (SPONGE) ×2 IMPLANT
GLOVE SURG SYN 6.5 ES PF (GLOVE) ×4 IMPLANT
GLOVE SURG SYN 6.5 PF PI (GLOVE) ×2 IMPLANT
GLOVE SURG UNDER POLY LF SZ7 (GLOVE) ×4 IMPLANT
GOWN STRL REUS W/ TWL LRG LVL3 (GOWN DISPOSABLE) ×3 IMPLANT
GOWN STRL REUS W/TWL LRG LVL3 (GOWN DISPOSABLE) ×3
GRASPER SUT TROCAR 14GX15 (MISCELLANEOUS) ×1 IMPLANT
IRRIGATOR SUCT 8 DISP DVNC XI (IRRIGATION / IRRIGATOR) IMPLANT
IRRIGATOR SUCTION 8MM XI DISP (IRRIGATION / IRRIGATOR)
IV NS 1000ML (IV SOLUTION)
IV NS 1000ML BAXH (IV SOLUTION) IMPLANT
LABEL OR SOLS (LABEL) ×2 IMPLANT
MANIFOLD NEPTUNE II (INSTRUMENTS) ×2 IMPLANT
NDL INSUFFLATION 14GA 120MM (NEEDLE) ×1 IMPLANT
NEEDLE HYPO 22GX1.5 SAFETY (NEEDLE) ×2 IMPLANT
NEEDLE INSUFFLATION 14GA 120MM (NEEDLE) ×2 IMPLANT
NS IRRIG 500ML POUR BTL (IV SOLUTION) ×2 IMPLANT
OBTURATOR OPTICAL STANDARD 8MM (TROCAR) ×1
OBTURATOR OPTICAL STND 8 DVNC (TROCAR) ×1
OBTURATOR OPTICALSTD 8 DVNC (TROCAR) ×1 IMPLANT
PACK LAP CHOLECYSTECTOMY (MISCELLANEOUS) ×2 IMPLANT
PENCIL ELECTRO HAND CTR (MISCELLANEOUS) ×2 IMPLANT
SEAL CANN UNIV 5-8 DVNC XI (MISCELLANEOUS) ×3 IMPLANT
SEAL XI 5MM-8MM UNIVERSAL (MISCELLANEOUS) ×3
SET TUBE SMOKE EVAC HIGH FLOW (TUBING) ×2 IMPLANT
SOLUTION ELECTROLUBE (MISCELLANEOUS) ×2 IMPLANT
STAPLER CANNULA SEAL DVNC XI (STAPLE) ×1 IMPLANT
STAPLER CANNULA SEAL XI (STAPLE) ×1
SUT MNCRL 4-0 (SUTURE) ×2
SUT MNCRL 4-0 27XMFL (SUTURE) ×2
SUT VICRYL 0 AB UR-6 (SUTURE) ×2 IMPLANT
SUTURE MNCRL 4-0 27XMF (SUTURE) ×2 IMPLANT
SYR 30ML LL (SYRINGE) IMPLANT
SYSTEM WECK SHIELD CLOSURE (TROCAR) IMPLANT
WATER STERILE IRR 500ML POUR (IV SOLUTION) ×2 IMPLANT

## 2021-09-28 NOTE — Interval H&P Note (Signed)
No change. OK to proceed.

## 2021-09-28 NOTE — Anesthesia Preprocedure Evaluation (Signed)
Anesthesia Evaluation  Patient identified by MRN, date of birth, ID band Patient awake    Reviewed: Allergy & Precautions, NPO status , Patient's Chart, lab work & pertinent test results  History of Anesthesia Complications Negative for: history of anesthetic complications  Airway Mallampati: III   Neck ROM: Full    Dental no notable dental hx.    Pulmonary neg pulmonary ROS,    Pulmonary exam normal breath sounds clear to auscultation       Cardiovascular hypertension, Normal cardiovascular exam Rhythm:Regular Rate:Normal  ECG 09/24/21: normal  Echo 08/08/21: normal   Neuro/Psych  Headaches,    GI/Hepatic GERD  ,Acute cholecystitis   Endo/Other  Obesity   Renal/GU negative Renal ROS     Musculoskeletal   Abdominal   Peds  Hematology negative hematology ROS (+)   Anesthesia Other Findings   Reproductive/Obstetrics                             Anesthesia Physical Anesthesia Plan  ASA: 2 and emergent  Anesthesia Plan: General   Post-op Pain Management:    Induction: Intravenous  PONV Risk Score and Plan: 3 and Ondansetron, Dexamethasone and Treatment may vary due to age or medical condition  Airway Management Planned: Oral ETT  Additional Equipment:   Intra-op Plan:   Post-operative Plan: Extubation in OR  Informed Consent: I have reviewed the patients History and Physical, chart, labs and discussed the procedure including the risks, benefits and alternatives for the proposed anesthesia with the patient or authorized representative who has indicated his/her understanding and acceptance.     Dental advisory given  Plan Discussed with: CRNA  Anesthesia Plan Comments: (Patient consented for risks of anesthesia including but not limited to:  - adverse reactions to medications - damage to eyes, teeth, lips or other oral mucosa - nerve damage due to positioning  - sore  throat or hoarseness - damage to heart, brain, nerves, lungs, other parts of body or loss of life  Informed patient about role of CRNA in peri- and intra-operative care.  Patient voiced understanding.)        Anesthesia Quick Evaluation

## 2021-09-28 NOTE — Anesthesia Postprocedure Evaluation (Signed)
Anesthesia Post Note  Patient: Brenda Osborn  Procedure(s) Performed: XI ROBOTIC ASSISTED LAPAROSCOPIC CHOLECYSTECTOMY (Abdomen) INDOCYANINE GREEN FLUORESCENCE IMAGING (ICG) (Abdomen)  Patient location during evaluation: PACU Anesthesia Type: General Level of consciousness: awake and alert, oriented and patient cooperative Pain management: pain level controlled Vital Signs Assessment: post-procedure vital signs reviewed and stable Respiratory status: spontaneous breathing, nonlabored ventilation and respiratory function stable Cardiovascular status: blood pressure returned to baseline and stable Postop Assessment: adequate PO intake Anesthetic complications: no   No notable events documented.   Last Vitals:  Vitals:   09/28/21 1045 09/28/21 1109  BP: 117/87 112/76  Pulse: 70 68  Resp: 12 16  Temp:  (!) 36.3 C  SpO2: 96% 94%    Last Pain:  Vitals:   09/28/21 1109  TempSrc: Tympanic  PainSc: 1                  Darrin Nipper

## 2021-09-28 NOTE — Op Note (Signed)
Preoperative diagnosis:  acute and cholecystitis  Postoperative diagnosis: same as above  Procedure: Robotic assisted Laparoscopic Cholecystectomy.   Anesthesia: GETA   Surgeon: Benjamine Sprague  Specimen: Gallbladder  Complications: None  EBL: 34mL  Wound Classification: Clean Contaminated  Indications: see HPI  Findings: Critical view of safety noted Cystic duct and artery identified, ligated and divided, clips remained intact at end of procedure Adequate hemostasis  Description of procedure:  The patient was placed on the operating table in the supine position. SCDs placed, pre-op abx administered.  General anesthesia was induced and OG tube placed by anesthesia. A time-out was completed verifying correct patient, procedure, site, positioning, and implant(s) and/or special equipment prior to beginning this procedure. The abdomen was prepped and draped in the usual sterile fashion.    Veress needle was placed at the Palmer's point and insufflation was started after confirming a positive saline drop test and no immediate increase in abdominal pressure.  After reaching 15 mm, the Veress needle was removed and a 8 mm port was placed via optiview technique under umbilicus measured 16XW from gallbladder.  The abdomen was inspected and no abnormalities or injuries were found.  Under direct vision, ports were placed in the following locations: One 12 mm patient left of the umbilicus, 8cm from the optiviewed port, one 8 mm port placed to the patient right of the umbilical port 8 cm apart.  1 additional 8 mm port placed lateral to the 41mm port.  Once ports were placed, The table was placed in the reverse Trendelenburg position with the right side up. The Xi platform was brought into the operative field and docked to the ports successfully.  An endoscope was placed through the umbilical port, fenestrated grasper through the adjacent patient right port, prograsp to the far patient left port, and then  a hook cautery in the left port.  The dome of the gallbladder was grasped with prograsp, passed and retracted over the dome of the liver. Adhesions between the gallbladder and omentum, duodenum and transverse colon were lysed via hook cautery. The infundibulum was grasped with the fenestrated grasper and retracted toward the right lower quadrant. This maneuver exposed Calots triangle. The peritoneum overlying the gallbladder infundibulum was then dissected  and the cystic duct and cystic artery identified.  Critical view of safety with the liver bed clearly visible behind the duct and artery with no additional structures noted.  The cystic duct and cystic artery clipped and divided close to the gallbladder.     The gallbladder was then dissected from its peritoneal and liver bed attachments by electrocautery. Hemostasis was checked prior to removing the hook cautery and the Endo Catch bag was then placed through the 12 mm port and the gallbladder was removed.  The gallbladder was passed off the table as a specimen. There was no evidence of bleeding from the gallbladder fossa or cystic artery or leakage of the bile from the cystic duct stump. The 12 mm port site closed with PMI using 0 vicryl under direct vision.  Abdomen desufflated and secondary trocars were removed under direct vision. No bleeding was noted. All skin incisions then closed with subcuticular sutures of 4-0 monocryl and dressed with topical skin adhesive. The orogastric tube was removed and patient extubated.  The patient tolerated the procedure well and was taken to the postanesthesia care unit in stable condition.  All sponge and instrument count correct at end of procedure.

## 2021-09-28 NOTE — Transfer of Care (Signed)
Immediate Anesthesia Transfer of Care Note  Patient: Brenda Osborn  Procedure(s) Performed: XI ROBOTIC ASSISTED LAPAROSCOPIC CHOLECYSTECTOMY (Abdomen) INDOCYANINE GREEN FLUORESCENCE IMAGING (ICG) (Abdomen)  Patient Location: PACU  Anesthesia Type:General  Level of Consciousness: awake, alert  and oriented  Airway & Oxygen Therapy: Patient Spontanous Breathing  Post-op Assessment: Report given to RN and Post -op Vital signs reviewed and stable  Post vital signs: Reviewed and stable  Last Vitals:  Vitals Value Taken Time  BP 134/92 09/28/21 1013  Temp 36.2 C 09/28/21 1013  Pulse 74 09/28/21 1014  Resp 20 09/28/21 1014  SpO2 98 % 09/28/21 1014  Vitals shown include unvalidated device data.  Last Pain:  Vitals:   09/28/21 0754  TempSrc: Oral  PainSc: 1          Complications: No notable events documented.

## 2021-09-28 NOTE — Discharge Instructions (Addendum)
Laparoscopic Cholecystectomy, Care After This sheet gives you information about how to care for yourself after your procedure. Your doctor may also give you more specific instructions. If you have problems or questions, contact your doctor. Follow these instructions at home: Care for cuts from surgery (incisions)  Follow instructions from your doctor about how to take care of your cuts from surgery. Make sure you: Wash your hands with soap and water before you change your bandage (dressing). If you cannot use soap and water, use hand sanitizer. Change your bandage as told by your doctor. Leave stitches (sutures), skin glue, or skin tape (adhesive) strips in place. They may need to stay in place for 2 weeks or longer. If tape strips get loose and curl up, you may trim the loose edges. Do not remove tape strips completely unless your doctor says it is okay. Do not take baths, swim, or use a hot tub until your doctor says it is okay. OK TO SHOWER 24HRS AFTER YOUR SURGERY.  Check your surgical cut area every day for signs of infection. Check for: More redness, swelling, or pain. More fluid or blood. Warmth. Pus or a bad smell. Activity Do not drive or use heavy machinery while taking prescription pain medicine. Do not play contact sports until your doctor says it is okay. Do not drive for 24 hours if you were given a medicine to help you relax (sedative). Rest as needed. Do not return to work or school until your doctor says it is okay. General instructions  tylenol and advil as needed for discomfort.  Please alternate between the two every four hours as needed for pain.    Use narcotics, if prescribed, only when tylenol and motrin is not enough to control pain.  325-650mg every 8hrs to max of 3000mg/24hrs (including the 325mg in every norco dose) for the tylenol.    Advil up to 800mg per dose every 8hrs as needed for pain.   To prevent or treat constipation while you are taking prescription  pain medicine, your doctor may recommend that you: Drink enough fluid to keep your pee (urine) clear or pale yellow. Take over-the-counter or prescription medicines. Eat foods that are high in fiber, such as fresh fruits and vegetables, whole grains, and beans. Limit foods that are high in fat and processed sugars, such as fried and sweet foods. Contact a doctor if: You develop a rash. You have more redness, swelling, or pain around your surgical cuts. You have more fluid or blood coming from your surgical cuts. Your surgical cuts feel warm to the touch. You have pus or a bad smell coming from your surgical cuts. You have a fever. One or more of your surgical cuts breaks open. You have trouble breathing. You have chest pain. You have pain that is getting worse in your shoulders. You faint or feel dizzy when you stand. You have very bad pain in your belly (abdomen). You are sick to your stomach (nauseous) for more than one day. You have throwing up (vomiting) that lasts for more than one day. You have leg pain. This information is not intended to replace advice given to you by your health care provider. Make sure you discuss any questions you have with your health care provider. Document Released: 06/25/2008 Document Revised: 04/06/2016 Document Reviewed: 03/04/2016 Elsevier Interactive Patient Education  2019 [employer]     AMBULATORY SURGERY  DISCHARGE INSTRUCTIONS  The drugs that you were given will stay in your system until tomorrow   so for the next 24 hours you should not:  Drive an automobile Make any legal decisions Drink any alcoholic beverage  You may resume regular meals tomorrow.  Today it is better to start with liquids and gradually work up to solid foods.  You may eat anything you prefer, but it is better to start with liquids, then soup and crackers, and gradually work up to solid foods.  Please notify your doctor immediately if you have any unusual bleeding,  trouble breathing, redness and pain at the surgery site, drainage, fever, or pain not relieved by medication.  Additional Instructions:  Please contact your physician with any problems or Same Day Surgery at [phone], Monday through Friday 6 am to 4 pm, or [place] [facility] [place] [facility] number at [phone]. 

## 2021-09-28 NOTE — Anesthesia Procedure Notes (Signed)
Procedure Name: Intubation Date/Time: 09/28/2021 8:58 AM Performed by: Hedda Slade, CRNA Pre-anesthesia Checklist: Patient identified, Patient being monitored, Timeout performed, Emergency Drugs available and Suction available Patient Re-evaluated:Patient Re-evaluated prior to induction Oxygen Delivery Method: Circle system utilized Preoxygenation: Pre-oxygenation with 100% oxygen Induction Type: IV induction Ventilation: Mask ventilation without difficulty Laryngoscope Size: 3 and McGraph Grade View: Grade I Tube type: Oral Tube size: 7.0 mm Number of attempts: 1 Airway Equipment and Method: Stylet Placement Confirmation: ETT inserted through vocal cords under direct vision, positive ETCO2 and breath sounds checked- equal and bilateral Secured at: 19 cm Tube secured with: Tape Dental Injury: Teeth and Oropharynx as per pre-operative assessment

## 2021-09-30 HISTORY — PX: GALLBLADDER SURGERY: SHX652

## 2021-10-02 LAB — SURGICAL PATHOLOGY

## 2021-11-25 IMAGING — CT CT ANGIO CHEST
2 of 6 series · 18 of 46 positions shown · IV contrast (APPLIED)
Comparison: None.

CLINICAL DATA: Pleuritic pain and elevated D-dimer in third
trimester pregnancy.

EXAM:
CT ANGIOGRAPHY CHEST WITH CONTRAST
TECHNIQUE: Multidetector CT imaging of the chest was performed using the
standard protocol during bolus administration of intravenous
contrast. Multiplanar CT image reconstructions and MIPs were
obtained to evaluate the vascular anatomy.
CONTRAST:  75mL OMNIPAQUE IOHEXOL 350 MG/ML SOLN

[Series 5: thins · axial · 0.75mm/px · z∈[-583,-352]mm · 15 of 255 slices shown]
[im 12/255  lung]
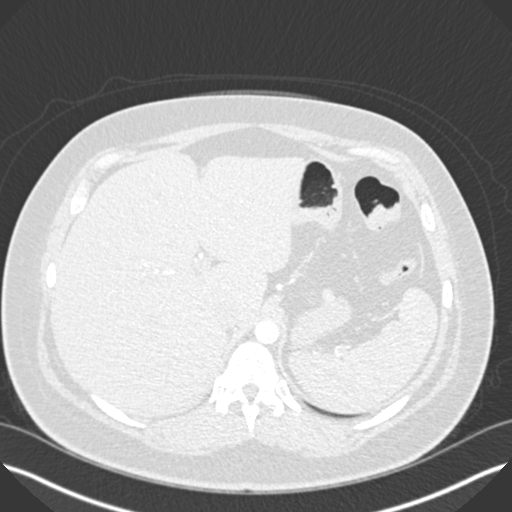
[im 34/255  soft-tissue]
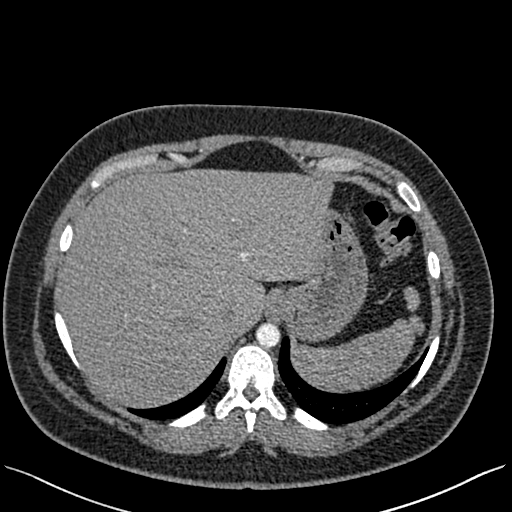
[im 45/255  lung]
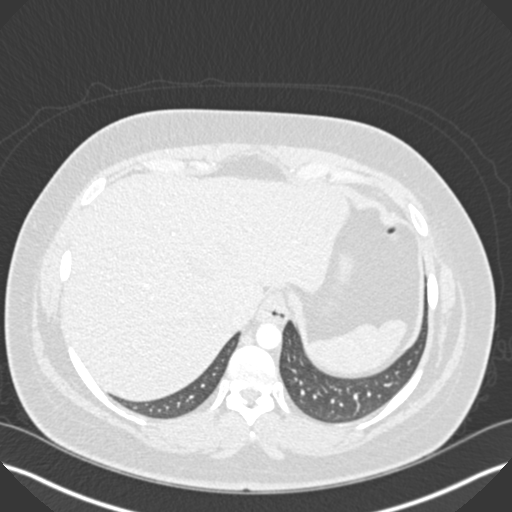
[im 67/255  soft-tissue]
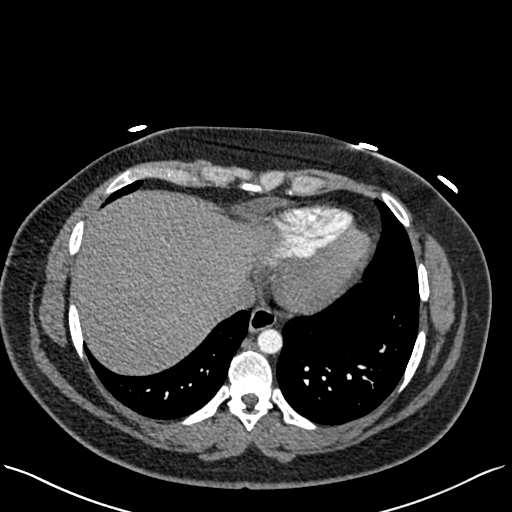
[im 78/255  lung]
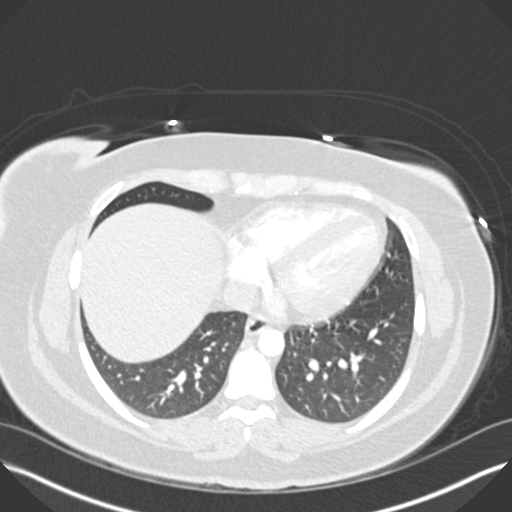
[im 100/255  soft-tissue]
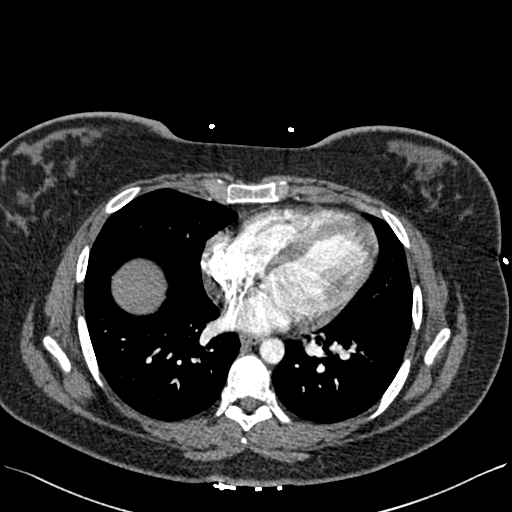
[im 111/255  lung]
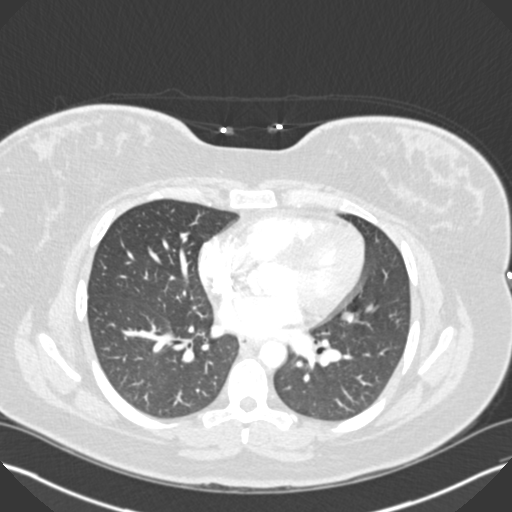
[im 133/255  soft-tissue]
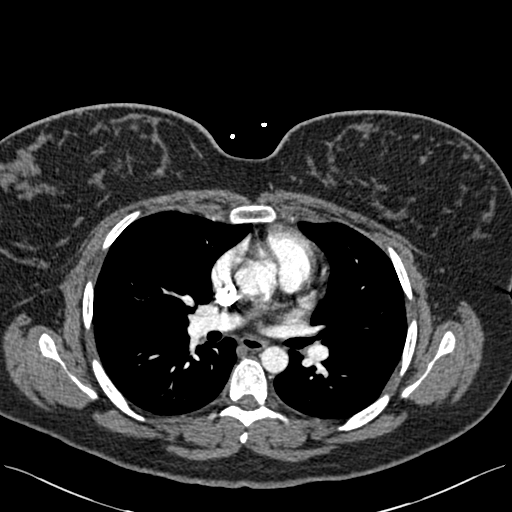
[im 144/255  lung]
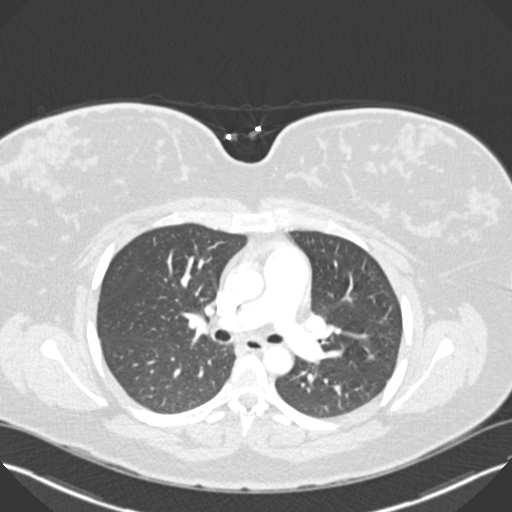
[im 155/255  soft-tissue]
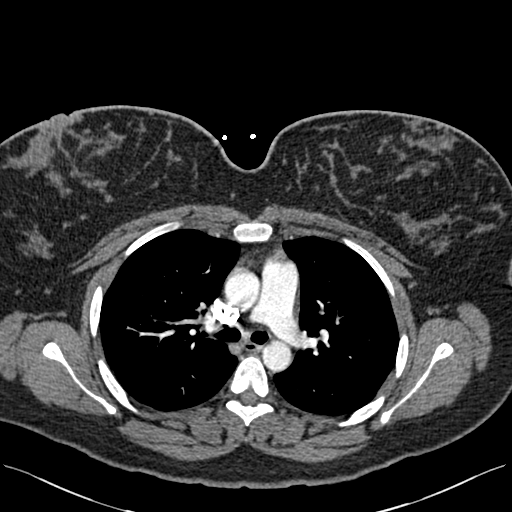
[im 177/255  lung]
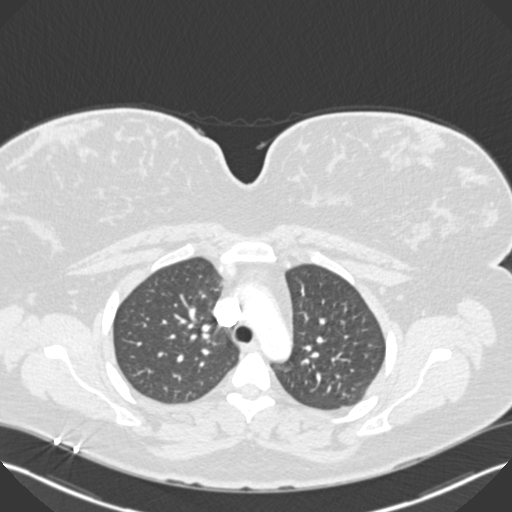
[im 188/255  soft-tissue]
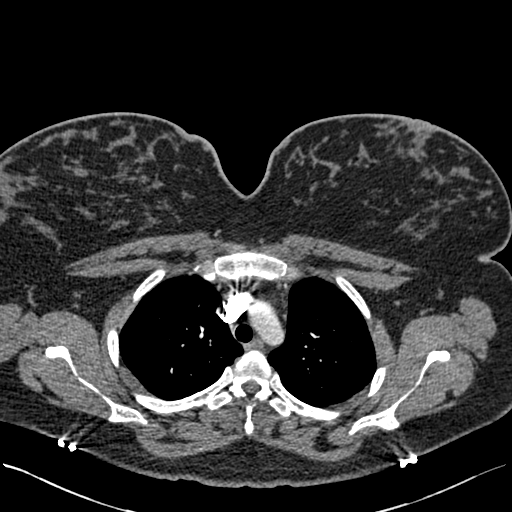
[im 210/255  lung]
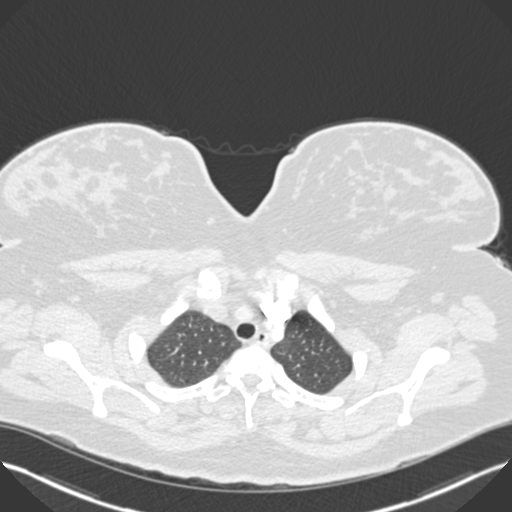
[im 221/255  soft-tissue]
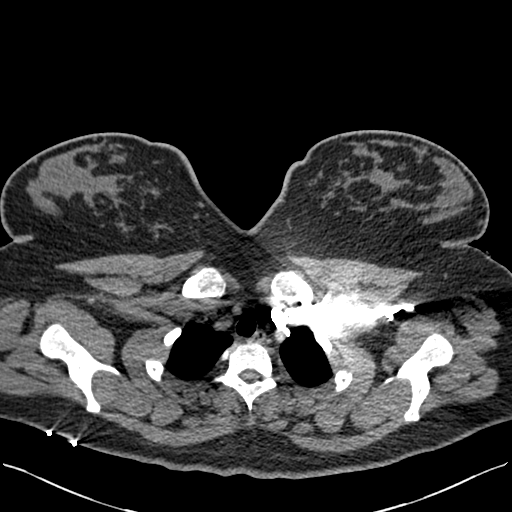
[im 243/255  lung]
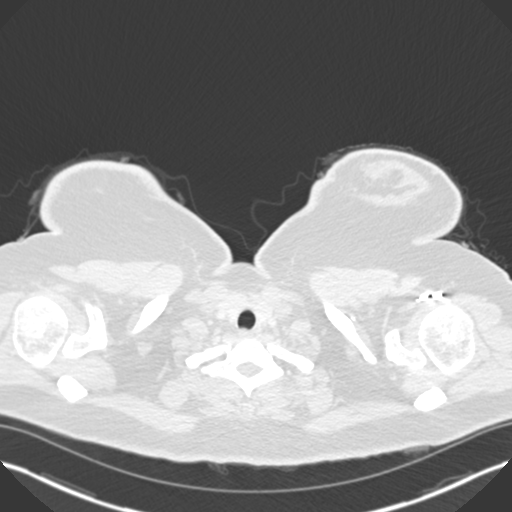

[Series 7: coronal mpr · coronal · 0.52mm/px · 3 of 94 slices shown]
[im 24/94  soft-tissue]
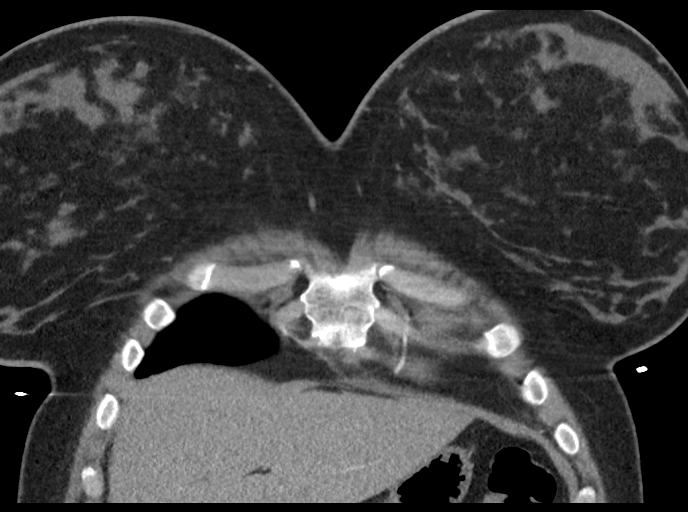
[im 47/94  soft-tissue]
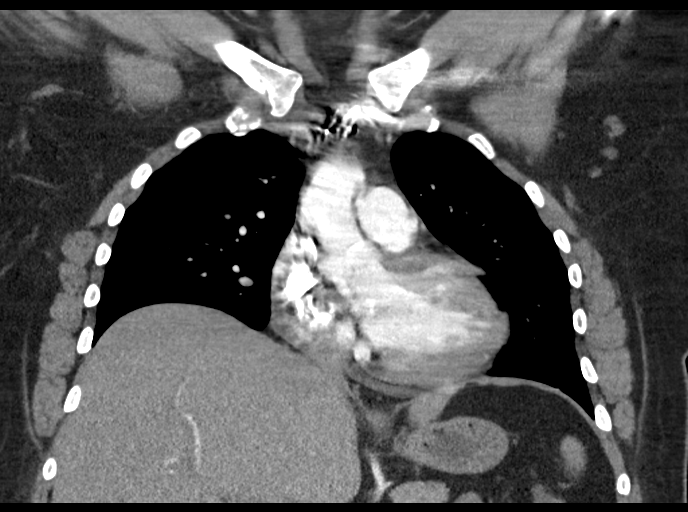
[im 70/94  soft-tissue]
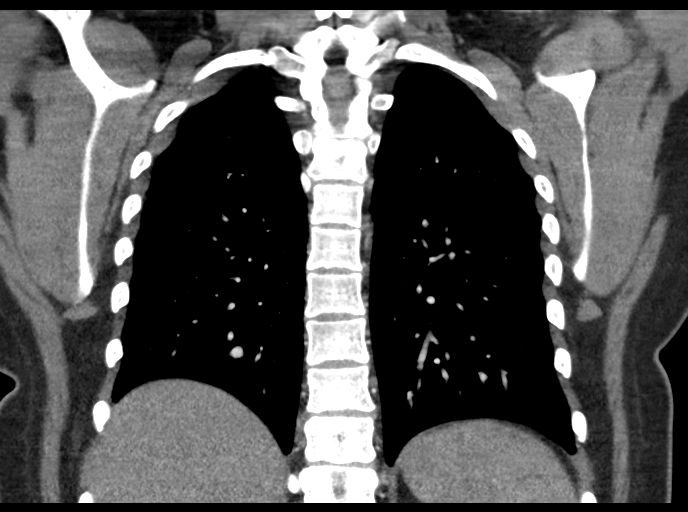

[18 of 46 positions shown; findings below may reference images not displayed]

FINDINGS: Cardiovascular: Satisfactory opacification of the pulmonary arteries
to the segmental level. No evidence of pulmonary embolism. Normal
heart size. No pericardial effusion.

Mediastinum/Nodes: Negative for adenopathy or mass

Lungs/Pleura: There is no edema, consolidation, effusion, or
pneumothorax.

Upper Abdomen: Negative

Musculoskeletal: Negative

Review of the MIP images confirms the above findings.
IMPRESSION: Negative for pulmonary embolism or other cause of symptoms.

## 2021-12-18 DIAGNOSIS — E538 Deficiency of other specified B group vitamins: Secondary | ICD-10-CM | POA: Insufficient documentation

## 2021-12-18 DIAGNOSIS — E559 Vitamin D deficiency, unspecified: Secondary | ICD-10-CM | POA: Insufficient documentation

## 2022-09-11 DIAGNOSIS — J029 Acute pharyngitis, unspecified: Secondary | ICD-10-CM | POA: Diagnosis not present

## 2022-09-11 DIAGNOSIS — R5383 Other fatigue: Secondary | ICD-10-CM | POA: Diagnosis not present

## 2022-09-11 DIAGNOSIS — Z3202 Encounter for pregnancy test, result negative: Secondary | ICD-10-CM | POA: Diagnosis not present

## 2022-09-30 NOTE — L&D Delivery Note (Signed)
Delivery Note  Date of delivery: 05/19/2023 Estimated Date of Delivery: 06/09/23 Patient's last menstrual period was 08/22/2022 (approximate). EGA: [redacted]w[redacted]d  Delivery Note At 7:12 PM a viable female was delivered via Vaginal, Spontaneous (Presentation: Left Occiput Anterior).  APGAR: 8, 8; weight pending.  Placenta status: Spontaneous, Intact.  Cord: 3 vessels with the following complications: None.  Cord pH: n/a  First Stage: Labor onset: 1830 Induction : Cytotec Analgesia /Anesthesia intrapartum: Epidural just before delivery AROM at time of delivery  Brenda Osborn presented to L&D with CHTN. She was inducted cytotec. Epidural placed just before delivery for pain relief was not given enough time to work.   Second Stage: Complete dilation at 1848 Onset of pushing at 1850 FHR second stage Cat I Delivery at 1912 on 05/19/2023  She progressed to complete and had a spontaneous vaginal birth of a live female over an intact perineum. The fetal head was delivered in OA position with restitution to LOA. No nuchal cord. Anterior then posterior shoulders delivered spontaneously with minimal assistance. Baby placed on mom's abdomen and attended to by transition RN. Cord clamped and cut after 1+ mins by FOB.   Third Stage: Placenta delivered intact with 3VC at 1915 Placenta disposition: routine disposal Uterine tone firm / bleeding min IV pitocin given for hemorrhage prophylaxis  Anesthesia:  Epidural bolus just before delivery Episiotomy: None Lacerations: None Suture Repair: n/a Est. Blood Loss (mL): 200  Complications: none  Mom to postpartum.  Baby to Couplet care / Skin to Skin.  Newborn: Birth Weight: pending  Apgar Scores: 8, 8 Feeding planned: breastfeeding   Cyril Mourning, CNM 05/19/2023 7:25 PM

## 2022-10-22 DIAGNOSIS — N898 Other specified noninflammatory disorders of vagina: Secondary | ICD-10-CM | POA: Diagnosis not present

## 2022-10-22 DIAGNOSIS — Z113 Encounter for screening for infections with a predominantly sexual mode of transmission: Secondary | ICD-10-CM | POA: Diagnosis not present

## 2022-10-24 DIAGNOSIS — O0992 Supervision of high risk pregnancy, unspecified, second trimester: Secondary | ICD-10-CM | POA: Insufficient documentation

## 2022-10-24 DIAGNOSIS — O3680X0 Pregnancy with inconclusive fetal viability, not applicable or unspecified: Secondary | ICD-10-CM | POA: Diagnosis not present

## 2022-10-24 DIAGNOSIS — N912 Amenorrhea, unspecified: Secondary | ICD-10-CM | POA: Diagnosis not present

## 2022-10-24 DIAGNOSIS — O0991 Supervision of high risk pregnancy, unspecified, first trimester: Secondary | ICD-10-CM

## 2022-10-24 HISTORY — DX: Supervision of high risk pregnancy, unspecified, first trimester: O09.91

## 2022-11-15 DIAGNOSIS — G43001 Migraine without aura, not intractable, with status migrainosus: Secondary | ICD-10-CM | POA: Diagnosis not present

## 2022-11-15 DIAGNOSIS — O26899 Other specified pregnancy related conditions, unspecified trimester: Secondary | ICD-10-CM | POA: Diagnosis not present

## 2022-11-15 DIAGNOSIS — R519 Headache, unspecified: Secondary | ICD-10-CM | POA: Diagnosis not present

## 2022-11-15 DIAGNOSIS — O10919 Unspecified pre-existing hypertension complicating pregnancy, unspecified trimester: Secondary | ICD-10-CM | POA: Diagnosis not present

## 2022-11-22 DIAGNOSIS — I10 Essential (primary) hypertension: Secondary | ICD-10-CM | POA: Diagnosis not present

## 2022-11-22 DIAGNOSIS — R519 Headache, unspecified: Secondary | ICD-10-CM | POA: Diagnosis not present

## 2022-11-22 DIAGNOSIS — O26891 Other specified pregnancy related conditions, first trimester: Secondary | ICD-10-CM | POA: Diagnosis not present

## 2022-11-22 DIAGNOSIS — R600 Localized edema: Secondary | ICD-10-CM | POA: Diagnosis not present

## 2022-11-22 LAB — OB RESULTS CONSOLE GC/CHLAMYDIA
Chlamydia: NEGATIVE
Neisseria Gonorrhea: NEGATIVE

## 2022-11-22 LAB — OB RESULTS CONSOLE HIV ANTIBODY (ROUTINE TESTING): HIV: NONREACTIVE

## 2022-11-23 LAB — HEPATITIS C ANTIBODY: HCV Ab: NEGATIVE

## 2022-11-23 LAB — OB RESULTS CONSOLE RPR: RPR: NONREACTIVE

## 2022-11-23 LAB — OB RESULTS CONSOLE VARICELLA ZOSTER ANTIBODY, IGG: Varicella: NON-IMMUNE/NOT IMMUNE

## 2022-11-23 LAB — OB RESULTS CONSOLE HEPATITIS B SURFACE ANTIGEN: Hepatitis B Surface Ag: NEGATIVE

## 2022-11-23 LAB — OB RESULTS CONSOLE RUBELLA ANTIBODY, IGM: Rubella: IMMUNE

## 2022-12-05 ENCOUNTER — Other Ambulatory Visit: Payer: Self-pay | Admitting: Obstetrics

## 2022-12-05 DIAGNOSIS — Z3689 Encounter for other specified antenatal screening: Secondary | ICD-10-CM

## 2022-12-16 ENCOUNTER — Ambulatory Visit: Payer: BC Managed Care – PPO | Attending: Obstetrics and Gynecology

## 2022-12-16 ENCOUNTER — Other Ambulatory Visit: Payer: Self-pay

## 2022-12-16 ENCOUNTER — Ambulatory Visit: Payer: Self-pay | Admitting: Obstetrics and Gynecology

## 2022-12-16 VITALS — BP 132/93 | HR 96 | Temp 98.0°F | Ht 68.0 in | Wt 241.5 lb

## 2022-12-16 DIAGNOSIS — O3515X0 Maternal care for (suspected) chromosomal abnormality in fetus, sex chromosome abnormality, not applicable or unspecified: Secondary | ICD-10-CM | POA: Diagnosis not present

## 2022-12-16 DIAGNOSIS — O285 Abnormal chromosomal and genetic finding on antenatal screening of mother: Secondary | ICD-10-CM

## 2022-12-16 DIAGNOSIS — Z3A15 15 weeks gestation of pregnancy: Secondary | ICD-10-CM | POA: Diagnosis not present

## 2022-12-16 NOTE — Progress Notes (Signed)
Referring provider: [facility] Length of consultation: 30 minutes  Ms. 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 She was present at this visit with her partner, Merrily Pew and her 26 year old daughter.  Abnormal Non-Invasive Prenatal Screen: Kassiah previously completed cell-free DNA screening (cfDNA) in this pregnancy ordered by her Ob/Gyn.  Specifically, she underwent MaterniT21 with SCA testing. We reviewed that this screen analyzes cell-free DNA originating from the placenta that is found in the maternal blood circulation during pregnancy. This test can provide information regarding the presence or absence of extra fetal DNA for chromosomes 13, 18, and 21 as well as the sex chromosomes. The result is low risk for Down syndrome, Trisomy 18 and Trisomy 13. 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 Of note, 47,XYY syndrome is different from Klinefelter syndrome which is caused by an extra X chromosome rather than an extra Y chromosome (47,XXY).  Abnormal results of cell free DNA testing can be due to a chromosome difference in placental cells only, the presence of a chromosome condition in the fetus, mosaicism in the fetus or placenta, or a false positive result. The positive predictive value of XYY based upon calculation from the [facility] NIPS/Cell-Free DNA Screening Predictive Calculator for this patient is 25%.  This means that in 25% of cases with this result, the fetus is affected with the condition and in 75% of cases, the blood test results are a false positive.     We discussed that 47,XYY syndrome is caused by the presence of an extra copy of the Y chromosome in males. As a result of the extra Y chromosome, affected individuals have a total of 47 chromosomes rather than the usual 66. Most  cases of 47,XYY syndrome are not inherited, instead occurring as a random event during the formation of sperm cells.   We reviewed that symptoms of 47,XYY syndrome can vary greatly among affected individuals. Many individuals have no obvious signs of the condition and may never be diagnosed, while others have mild symptoms. Common features of 47,XYY syndrome include tall stature, potential learning difficulties in reading and writing, possible speech delays, and an increased vulnerability to ADHD and autism. Males with this condition may have an increased risk of asthma and epilepsy, although most males with 47,XYY syndrome do not experience these health problems. Other problems may include difficulties with coordination, hand tremors, hypotonia, and scoliosis. Males with 47,XYY syndrome often have normal sexual development. Affected individuals may have enlarged testes and slightly delayed puberty. Affected males are also usually able to father their own children who are chromosomally normal.    Behavioral difficulties such as impulsivity have also been noted in some affected individuals. We discussed that in the past, there were many misconceptions about 47,XYY syndrome, including that affected individuals are overly aggressive, lacking in empathy, and are more likely to engage in criminal activity. More recent studies suggest that although individuals with 47,XYY syndrome have an increased risk for learning difficulties and behavioral problems, they are not overly aggressive nor are they at increased risk for serious mental illness. Overall, most individuals with 47,XYY syndrome lead full, healthy, and typical lives.  We discussed that Jolea has the option of continuing the monitor the pregnancy via routine ultrasounds. However, pregnancies with 47,XYY syndrome would not be expected to demonstrate any ultrasound  abnormalities. Thus, a normal-appearing ultrasound would not rule out the possibility of the  baby being affected. We discussed that her baby should have an evaluation by pediatric genetics after birth to discuss postnatal genetic testing. We also discussed the option of amniocentesis for prenatal diagnosis. Possible procedural difficulties and complications that can arise include maternal infection, cramping, bleeding, fluid leakage, and/or pregnancy loss. The risk for pregnancy loss with an amniocentesis is 1/500. Per the [employer] (ACOG) Practice Bulletin 162, all pregnant women should be offered prenatal assessment for aneuploidy by diagnostic testing regardless of maternal age or other risk factors. If indicated, genetic testing that could be ordered on an amniocentesis sample includes a fetal karyotype, fetal microarray, and testing for specific syndromes. After hearing the above information, Reann declined amniocentesis for prenatal diagnosis and opted to continue with ultrasounds only.  Family history: We also obtained a detailed family history and pregnancy history.  This is the first pregnancy for [facility] and Josh together.  He has a healthy 64 year old son and she has a healthy 44 year old daughter from prior relationships. Her daughter may have some mild speech delay, but comprehends commands well.  Should the pediatrician be concerned about this, we would encourage an evaluation and are happy to discuss recurrence chances for a condition if one were found.  Evelisse also experienced a first trimester miscarriage prior to her daughter.  In the current pregnancy, she reports early spotting but no other complications or exposure to alcohol, tobacco or recreational drugs.  She is taking medication for high blood pressure and nausea as needed.  The family history was notable for hypertension, diabetes and heart disease in both sides of the family.  These conditions are most often due to a combination of genetic as well as lifestyle factors.  Josh had a  paternal aunt who passed away of SIDS in infancy. There may be many reasons for sudden death of an infant, including birth defects and some inherited metabolic syndromes.  Without knowledge of the cause for the death, it is difficult to determine the chance for other family members to have a similar outcome. Lastly, Joretta's mother is reported to have had 4 miscarriages. Recurrent pregnancy loss can occur due to many factors such as chromosome translocations, blood clotting disorders, immune conditions and unknown causes.  If more is learned about the cause for her losses, we are happy to review this further.  Since Chaquana has not had repeated losses, we would not recommend any additional testing at this time.  The remainder of the family history was unremarkable for birth defects, developmental differences or known genetic conditions.  Plan of Care: Ms. Malek declined amniocentesis for chromosome analysis. Detailed anatomy ultrasound scheduled for [redacted] weeks gestation at [facility]. We offered the option of carrier screening for CF, SMA and hemoglobinopathies.  The patient may decide to have this drawn at the time of her ultrasound. We will add this patient to the [facility] list for further consideration of testing after delivery.  We appreciate being involved in the care of this family and can be reached at [phone].  Wilburt Finlay, MS, CGC

## 2022-12-30 DIAGNOSIS — Z419 Encounter for procedure for purposes other than remedying health state, unspecified: Secondary | ICD-10-CM | POA: Diagnosis not present

## 2023-01-06 ENCOUNTER — Other Ambulatory Visit: Payer: Self-pay

## 2023-01-06 DIAGNOSIS — O10919 Unspecified pre-existing hypertension complicating pregnancy, unspecified trimester: Secondary | ICD-10-CM

## 2023-01-06 DIAGNOSIS — F411 Generalized anxiety disorder: Secondary | ICD-10-CM

## 2023-01-06 DIAGNOSIS — Q985 Karyotype 47, XYY: Secondary | ICD-10-CM

## 2023-01-06 DIAGNOSIS — O285 Abnormal chromosomal and genetic finding on antenatal screening of mother: Secondary | ICD-10-CM

## 2023-01-13 ENCOUNTER — Other Ambulatory Visit: Payer: Self-pay

## 2023-01-13 ENCOUNTER — Ambulatory Visit: Payer: BC Managed Care – PPO

## 2023-01-13 ENCOUNTER — Other Ambulatory Visit
Admission: RE | Admit: 2023-01-13 | Discharge: 2023-01-13 | Disposition: A | Discharge status: Home / Self Care | Payer: BC Managed Care – PPO | Source: Ambulatory Visit | Attending: Maternal & Fetal Medicine | Admitting: Maternal & Fetal Medicine

## 2023-01-13 ENCOUNTER — Ambulatory Visit (HOSPITAL_BASED_OUTPATIENT_CLINIC_OR_DEPARTMENT_OTHER): Payer: BC Managed Care – PPO

## 2023-01-13 VITALS — BP 126/87 | HR 97 | Temp 98.8°F | Ht 68.0 in | Wt 238.5 lb

## 2023-01-13 DIAGNOSIS — Z1371 Encounter for nonprocreative screening for genetic disease carrier status: Secondary | ICD-10-CM

## 2023-01-13 DIAGNOSIS — E669 Obesity, unspecified: Secondary | ICD-10-CM | POA: Diagnosis not present

## 2023-01-13 DIAGNOSIS — O99212 Obesity complicating pregnancy, second trimester: Secondary | ICD-10-CM

## 2023-01-13 DIAGNOSIS — O10012 Pre-existing essential hypertension complicating pregnancy, second trimester: Secondary | ICD-10-CM | POA: Diagnosis not present

## 2023-01-13 DIAGNOSIS — Z3A19 19 weeks gestation of pregnancy: Secondary | ICD-10-CM

## 2023-01-13 DIAGNOSIS — Z3689 Encounter for other specified antenatal screening: Secondary | ICD-10-CM | POA: Diagnosis not present

## 2023-01-13 DIAGNOSIS — Z3402 Encounter for supervision of normal first pregnancy, second trimester: Secondary | ICD-10-CM

## 2023-01-13 DIAGNOSIS — O3515X0 Maternal care for (suspected) chromosomal abnormality in fetus, sex chromosome abnormality, not applicable or unspecified: Secondary | ICD-10-CM | POA: Diagnosis not present

## 2023-01-13 NOTE — Progress Notes (Deleted)
Canceled consult

## 2023-01-16 LAB — HGB FRACTIONATION CASCADE
Hgb A2: 2.8 % (ref 1.8–3.2)
Hgb A: 96.9 % (ref 96.4–98.8)
Hgb F: 0.3 % (ref 0.0–2.0)
Hgb S: 0 %

## 2023-01-21 ENCOUNTER — Emergency Department
Admission: EM | Admit: 2023-01-21 | Discharge: 2023-01-21 | Disposition: A | Discharge status: Home / Self Care | Payer: BC Managed Care – PPO | Attending: Emergency Medicine | Admitting: Emergency Medicine

## 2023-01-21 ENCOUNTER — Other Ambulatory Visit: Payer: Self-pay

## 2023-01-21 DIAGNOSIS — R8271 Bacteriuria: Secondary | ICD-10-CM | POA: Diagnosis not present

## 2023-01-21 DIAGNOSIS — R110 Nausea: Secondary | ICD-10-CM | POA: Diagnosis not present

## 2023-01-21 DIAGNOSIS — O26892 Other specified pregnancy related conditions, second trimester: Secondary | ICD-10-CM | POA: Insufficient documentation

## 2023-01-21 DIAGNOSIS — R55 Syncope and collapse: Secondary | ICD-10-CM

## 2023-01-21 DIAGNOSIS — Z3A20 20 weeks gestation of pregnancy: Secondary | ICD-10-CM

## 2023-01-21 LAB — BASIC METABOLIC PANEL
Anion gap: 7 (ref 5–15)
BUN: 7 mg/dL (ref 6–20)
CO2: 23 mmol/L (ref 22–32)
Calcium: 8.5 mg/dL — ABNORMAL LOW (ref 8.9–10.3)
Chloride: 106 mmol/L (ref 98–111)
Creatinine, Ser: 0.62 mg/dL (ref 0.44–1.00)
GFR, Estimated: >60 mL/min (ref >60)
Glucose, Bld: 93 mg/dL (ref 70–99)
Potassium: 3.6 mmol/L (ref 3.5–5.1)
Sodium: 136 mmol/L (ref 135–145)

## 2023-01-21 LAB — URINALYSIS, ROUTINE W REFLEX MICROSCOPIC
Bilirubin Urine: NEGATIVE
Glucose, UA: NEGATIVE mg/dL
Hgb urine dipstick: NEGATIVE
Ketones, ur: NEGATIVE mg/dL
Nitrite: NEGATIVE
Protein, ur: NEGATIVE mg/dL
Specific Gravity, Urine: 1.016 (ref 1.005–1.030)
pH: 6 (ref 5.0–8.0)

## 2023-01-21 LAB — CBC
HCT: 35.2 % — ABNORMAL LOW (ref 36.0–46.0)
Hemoglobin: 11.9 g/dL — ABNORMAL LOW (ref 12.0–15.0)
MCH: 32.2 pg (ref 26.0–34.0)
MCHC: 33.8 g/dL (ref 30.0–36.0)
MCV: 95.4 fL (ref 80.0–100.0)
Platelets: 291 10*3/uL (ref 150–400)
RBC: 3.69 MIL/uL — ABNORMAL LOW (ref 3.87–5.11)
RDW: 12.1 % (ref 11.5–15.5)
WBC: 10.8 10*3/uL — ABNORMAL HIGH (ref 4.0–10.5)
nRBC: 0 % (ref 0.0–0.2)

## 2023-01-21 LAB — TROPONIN I (HIGH SENSITIVITY)
Troponin I (High Sensitivity): <2 ng/L (ref <18)
Troponin I (High Sensitivity): <2 ng/L (ref <18)

## 2023-01-21 LAB — POC URINE PREG, ED: Preg Test, Ur: POSITIVE — AB

## 2023-01-21 MED ORDER — SODIUM CHLORIDE 0.9 % IV BOLUS
1000.0000 mL | Freq: Once | INTRAVENOUS | Status: AC
  Administered 2023-01-21: 1000 mL via INTRAVENOUS

## 2023-01-21 NOTE — Discharge Instructions (Addendum)
Your workup was reassuring your blood pressure was reassuring.  You can return to the ER if you develop worsening symptoms fevers or any other concerns.  Otherwise stay well-hydrated and eat small meals every 2-3 hours and return to the ER for worsening symptoms or any other concerns-please call your cardiologist to make a follow-up appointment.

## 2023-01-21 NOTE — ED Provider Notes (Addendum)
[facility] Provider Note    Event Date/Time   First MD Initiated Contact with Patient 01/21/23 0845     (approximate)   History   Loss of Consciousness   HPI  Brenda Osborn is a 27 y.o. female with history of hypertension on Procardia, UTI during pregnancy recently treated with cefdinir who comes in with concerns for syncopal episode.  Patient reports that she is [redacted] weeks pregnant.  She is been compliant with her Procardia.  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  She reports eating and drinking well trhoughout day but not having anything to eat at night and that she was on scene at a patient's house when she started to feel hot and nauseated.  She states that she went to go sit down and lean up against a wall when she had brief LOC.  No seizure activity did not hit her head.  She states that she was able to ambulate to the ambulance where she then laid down and had another brief LOC. These occurred around 730AM.  She was given fluids and Zofran.  She denies any history of preeclampsia only chronic hypertension in her prior pregnancy as well.  Her heart rates were slightly tachycardic and she was given 4 of Zofran and 250 mL of normal saline.  She denies any significant chest pain, shortness of breath at this time. Maybe a little chest discomfort at 730AM but brief and resolved.  She denies any concerns with the pregnancy.  Denies any gush of fluids or vaginal bleeding or worsening pain.  She denies any headaches, changes in vision, abdominal pain or other concerns.   Physical Exam   Triage Vital Signs: ED Triage Vitals  Enc Vitals Group     BP 01/21/23 0830 120/88     Pulse Rate 01/21/23 0830 73     Resp 01/21/23 0830 16     Temp 01/21/23 0830 (!) 97.4 F (36.3 C)     Temp Source 01/21/23 0830 Oral     SpO2 01/21/23 0830 100 %     Weight 01/21/23 0829 237 lb (107.5 kg)     Height 01/21/23 0829  (1.727 m)     Head Circumference --       Peak Flow --      Pain Score 01/21/23 0829 0     Pain Loc --      Pain Edu? --      Excl. in GC? --     Most recent vital signs: Vitals:   01/21/23 0830  BP: 120/88  Pulse: 73  Resp: 16  Temp: (!) 97.4 F (36.3 C)  SpO2: 100%     General: Awake, no distress.  CV:  Good peripheral perfusion.  Resp:  Normal effort.  Abd:  No distention.  Soft nontender Other:  No significant swelling in legs.  No calf tenderness   ED Results / Procedures / Treatments   Labs (all labs ordered are listed, but only abnormal results are displayed) Labs Reviewed  BASIC METABOLIC PANEL - Abnormal; Notable for the following components:      Result Value   Calcium 8.5 (*)    All other components within normal limits  CBC - Abnormal; Notable for the following components:   WBC 10.8 (*)    RBC 3.69 (*)    Hemoglobin 11.9 (*)    HCT 35.2 (*)    All other components within normal limits  POC URINE PREG, ED -  Abnormal; Notable for the following components:   Preg Test, Ur Positive (*)    All other components within normal limits  URINALYSIS, ROUTINE W REFLEX MICROSCOPIC  CBG MONITORING, ED  TROPONIN I (HIGH SENSITIVITY)  TROPONIN I (HIGH SENSITIVITY)     EKG  My interpretation of EKG:  Normal sinus rate of 84 without any ST elevation, T wave inversion in lead III, normal intervals.  Reviewed prior EKG in March or 2022 and T wave version lead III is baseline   PROCEDURES:  Critical Care performed: No  Procedures   MEDICATIONS ORDERED IN ED: Medications  sodium chloride 0.9 % bolus 1,000 mL (1,000 mLs Intravenous New Bag/Given 01/21/23 0925)     IMPRESSION / MDM / ASSESSMENT AND PLAN / ED COURSE  I reviewed the triage vital signs and the nursing notes.   Patient's presentation is most consistent with acute presentation with potential threat to life or bodily function.   She comes in with syncopal episode.  EKG without evidence of any arrhythmia.  Suspect this is most likely  vasovagal, dehydration.  Will get labs to evaluate for electrolyte abnormalities, anemia given pregnancy.  We discussed risk factor for PE causing syncope but she denies any current shortness of breath no unilateral leg swelling to suggest DVT.  She denies any concerns for PE at this time.  She denies any concerns with pregnancy will assess fetal heart tones.  She is on Procardia but blood pressures appear normal here.  Will give some hydration.  BMP was reassuring.  Pregnancy test is positive troponin was negative.  CBC some slight anemia most likely related to pregnancy.  White count slightly elevated but she denies any infectious symptoms.  Repeat troponin is negative.  Patient is given a liter of fluid.  Patient has been asymptomatic has been ambulatory feeling much better.  We discussed following up with cardiology.  She wonders if this could be related to her Procardia causing some dizzy spells.  We discussed that this would need to be discussed with her cardiologist as I would not want to discontinue this at this time given her known chronic hypertension.  Do not see any evidence of eclampsia or preeclampsia.  Her urine is without evidence of any protein and blood pressures have been well-appearing here.  At this time she feels comfortable with discharge and will follow-up with cardiology and will return to the ER if she develops worsening symptoms or any other concerns.  We discussed adequate hydration and eating small meals every 2-3 hours to prevent dehydration while pregnant.   FHR was 130-she did not fall down on hit her stomach or have any vaginal bleeding or vaginal gush of fluids to suggest needing to get OB monitoring.  She reports feeling good fetal movement.   No obvious UTI does have squam cell will send for culture  11:32 AM  Reevaluated patient she denies any chest pain, shortness of breath, headaches, dizziness or any other concerns.  She denies any painful comfortable  discharge  The patient is on the cardiac monitor to evaluate for evidence of arrhythmia and/or significant heart rate changes.      FINAL CLINICAL IMPRESSION(S) / ED DIAGNOSES   Final diagnoses:  Syncope and collapse  [redacted] weeks gestation of pregnancy     Rx / DC Orders   ED Discharge Orders     None        Note:  This document was prepared using Dragon voice recognition software and may include  unintentional dictation errors.   Concha Se, MD 01/21/23 1126    Concha Se, MD 01/21/23 1127    Concha Se, MD 01/21/23 1128    Concha Se, MD 01/21/23 [phone]

## 2023-01-21 NOTE — ED Notes (Signed)
This RN called L&D who stated pt needs to be assessed in the ED first

## 2023-01-21 NOTE — ED Triage Notes (Signed)
First RN note: Pt was at work and had a syncopal episode. Pt reporting feeling hot and nauseated  EMS gave  of zofran IV and of NS. Pt is [redacted] weeks pregnant   VSS for EMS HR 106 99% RA

## 2023-01-22 LAB — URINE CULTURE: Culture: NO GROWTH

## 2023-01-29 DIAGNOSIS — Z419 Encounter for procedure for purposes other than remedying health state, unspecified: Secondary | ICD-10-CM | POA: Diagnosis not present

## 2023-01-30 ENCOUNTER — Telehealth: Payer: Self-pay | Admitting: Obstetrics and Gynecology

## 2023-01-30 LAB — MISC LABCORP TEST (SEND OUT): Labcorp test code: 481758

## 2023-01-30 NOTE — Telephone Encounter (Signed)
Left voicemail for Brenda Osborn to call back at [phone] to review her carrier screening results.  They are negative for CF, SMA and hemoglobinopathies.  Cherly Anderson, MS, CGC

## 2023-02-03 ENCOUNTER — Telehealth: Payer: Self-pay | Admitting: Obstetrics and Gynecology

## 2023-02-03 NOTE — Telephone Encounter (Signed)
PC to [facility] to review carrier screening results.  The Inheritest results for cystic fibrosis and spinal muscular atrophy were both negative.  This result greatly reduces, but cannot eliminate, the chance to have a child with these conditions.  See report for residual risk estimates.  The hemoglobinopathy evaluation showed normal adult hemoglobin (AA) and she has a normal MCV, which would indicate that she is not a carrier for hemoglobinopathies.  Because these conditions are inherited as recessive disorders, and her results are negative, we would not recommend any additional testing for her partner.  We may be reached at [phone].  Cherly Anderson, MS, CGC

## 2023-02-10 ENCOUNTER — Other Ambulatory Visit: Payer: Self-pay

## 2023-02-10 DIAGNOSIS — O99210 Obesity complicating pregnancy, unspecified trimester: Secondary | ICD-10-CM

## 2023-02-10 DIAGNOSIS — F411 Generalized anxiety disorder: Secondary | ICD-10-CM

## 2023-02-10 DIAGNOSIS — O10919 Unspecified pre-existing hypertension complicating pregnancy, unspecified trimester: Secondary | ICD-10-CM

## 2023-02-10 DIAGNOSIS — O285 Abnormal chromosomal and genetic finding on antenatal screening of mother: Secondary | ICD-10-CM

## 2023-02-12 ENCOUNTER — Other Ambulatory Visit: Payer: Self-pay

## 2023-02-12 ENCOUNTER — Ambulatory Visit: Payer: BC Managed Care – PPO | Attending: Obstetrics

## 2023-02-12 ENCOUNTER — Ambulatory Visit (HOSPITAL_BASED_OUTPATIENT_CLINIC_OR_DEPARTMENT_OTHER): Payer: BC Managed Care – PPO | Admitting: Obstetrics

## 2023-02-12 DIAGNOSIS — E669 Obesity, unspecified: Secondary | ICD-10-CM | POA: Diagnosis not present

## 2023-02-12 DIAGNOSIS — O10912 Unspecified pre-existing hypertension complicating pregnancy, second trimester: Secondary | ICD-10-CM

## 2023-02-12 DIAGNOSIS — O99210 Obesity complicating pregnancy, unspecified trimester: Secondary | ICD-10-CM

## 2023-02-12 DIAGNOSIS — O289 Unspecified abnormal findings on antenatal screening of mother: Secondary | ICD-10-CM | POA: Insufficient documentation

## 2023-02-12 DIAGNOSIS — F411 Generalized anxiety disorder: Secondary | ICD-10-CM

## 2023-02-12 DIAGNOSIS — O3510X0 Maternal care for (suspected) chromosomal abnormality in fetus, unspecified, not applicable or unspecified: Secondary | ICD-10-CM | POA: Diagnosis not present

## 2023-02-12 DIAGNOSIS — O10012 Pre-existing essential hypertension complicating pregnancy, second trimester: Secondary | ICD-10-CM

## 2023-02-12 DIAGNOSIS — Z3A23 23 weeks gestation of pregnancy: Secondary | ICD-10-CM | POA: Insufficient documentation

## 2023-02-12 DIAGNOSIS — O3515X0 Maternal care for (suspected) chromosomal abnormality in fetus, sex chromosome abnormality, not applicable or unspecified: Secondary | ICD-10-CM | POA: Diagnosis not present

## 2023-02-12 DIAGNOSIS — O99212 Obesity complicating pregnancy, second trimester: Secondary | ICD-10-CM | POA: Insufficient documentation

## 2023-02-12 DIAGNOSIS — O10919 Unspecified pre-existing hypertension complicating pregnancy, unspecified trimester: Secondary | ICD-10-CM

## 2023-02-12 DIAGNOSIS — O280 Abnormal hematological finding on antenatal screening of mother: Secondary | ICD-10-CM

## 2023-02-12 DIAGNOSIS — O285 Abnormal chromosomal and genetic finding on antenatal screening of mother: Secondary | ICD-10-CM

## 2023-02-12 NOTE — Progress Notes (Signed)
MFM Note  Brenda Osborn was seen for a follow up exam due to chronic hypertension that is not treated with any medications.  She is currently 23 weeks and 2 days pregnant.    The patient was treated with nifedipine earlier in her pregnancy.  However, she did not like the way the medication made her feel and it was discontinued.  Her blood pressure today was 132/84.  Her cell free DNA test also indicated an increased risk that the fetus has XYY chromosomes.  The overall EFW 1 pound 10 ounces measures at the 98th percentile for her gestational age.  There was normal amniotic fluid noted today.  Due to the abnormal cell free DNA test, the patient was offered and declined an amniocentesis today for definitive diagnosis of fetal chromosomal abnormalities.  She will have the baby tested after birth.   The patient was advised that as her blood pressures are within normal limits at this time, continued observation without any medication is reasonable.    However, her blood pressures may start to increase and she may need to be restarted on an antihypertensive medication later in her pregnancy.  As she does not like the way Procardia makes her feel, an alternative medication such as labetalol may be used if necessary.     Due to chronic hypertension and the abnormal cell free DNA test, a follow-up growth scan was scheduled in 5 weeks.   Weekly fetal testing starting at 32 weeks is recommended should she require an antihypertensive medication for treatment of chronic hypertension.  The patient stated that all of her questions were answered today.  A total of 20 minutes was spent counseling and coordinating the care for this patient.  Greater than 50% of the time was spent in direct face-to-face contact.

## 2023-03-01 DIAGNOSIS — Z419 Encounter for procedure for purposes other than remedying health state, unspecified: Secondary | ICD-10-CM | POA: Diagnosis not present

## 2023-03-14 ENCOUNTER — Observation Stay: Payer: BC Managed Care – PPO

## 2023-03-14 ENCOUNTER — Observation Stay
Admission: EM | Admit: 2023-03-14 | Discharge: 2023-03-14 | Disposition: A | Discharge status: Home / Self Care | Payer: BC Managed Care – PPO | Attending: Certified Nurse Midwife | Admitting: Certified Nurse Midwife

## 2023-03-14 ENCOUNTER — Other Ambulatory Visit: Payer: Self-pay

## 2023-03-14 ENCOUNTER — Encounter: Payer: Self-pay | Admitting: Obstetrics and Gynecology

## 2023-03-14 DIAGNOSIS — Z79899 Other long term (current) drug therapy: Secondary | ICD-10-CM | POA: Diagnosis not present

## 2023-03-14 DIAGNOSIS — Z7982 Long term (current) use of aspirin: Secondary | ICD-10-CM | POA: Insufficient documentation

## 2023-03-14 DIAGNOSIS — O99891 Other specified diseases and conditions complicating pregnancy: Secondary | ICD-10-CM | POA: Insufficient documentation

## 2023-03-14 DIAGNOSIS — O99612 Diseases of the digestive system complicating pregnancy, second trimester: Secondary | ICD-10-CM | POA: Diagnosis not present

## 2023-03-14 DIAGNOSIS — Z3A27 27 weeks gestation of pregnancy: Secondary | ICD-10-CM | POA: Insufficient documentation

## 2023-03-14 DIAGNOSIS — O26892 Other specified pregnancy related conditions, second trimester: Principal | ICD-10-CM | POA: Insufficient documentation

## 2023-03-14 DIAGNOSIS — K6289 Other specified diseases of anus and rectum: Secondary | ICD-10-CM | POA: Diagnosis not present

## 2023-03-14 DIAGNOSIS — O10012 Pre-existing essential hypertension complicating pregnancy, second trimester: Secondary | ICD-10-CM | POA: Diagnosis not present

## 2023-03-14 DIAGNOSIS — O4700 False labor before 37 completed weeks of gestation, unspecified trimester: Secondary | ICD-10-CM | POA: Diagnosis present

## 2023-03-14 LAB — COMPREHENSIVE METABOLIC PANEL
ALT: 18 U/L (ref 0–44)
AST: 19 U/L (ref 15–41)
Albumin: 3.1 g/dL — ABNORMAL LOW (ref 3.5–5.0)
Alkaline Phosphatase: 97 U/L (ref 38–126)
Anion gap: 8 (ref 5–15)
BUN: 8 mg/dL (ref 6–20)
CO2: 23 mmol/L (ref 22–32)
Calcium: 8.7 mg/dL — ABNORMAL LOW (ref 8.9–10.3)
Chloride: 106 mmol/L (ref 98–111)
Creatinine, Ser: 0.59 mg/dL (ref 0.44–1.00)
GFR, Estimated: >60 mL/min (ref >60)
Glucose, Bld: 76 mg/dL (ref 70–99)
Potassium: 3.5 mmol/L (ref 3.5–5.1)
Sodium: 137 mmol/L (ref 135–145)
Total Bilirubin: 0.5 mg/dL (ref 0.3–1.2)
Total Protein: 6.6 g/dL (ref 6.5–8.1)

## 2023-03-14 LAB — CBC
HCT: 33.4 % — ABNORMAL LOW (ref 36.0–46.0)
Hemoglobin: 11.2 g/dL — ABNORMAL LOW (ref 12.0–15.0)
MCH: 32.7 pg (ref 26.0–34.0)
MCHC: 33.5 g/dL (ref 30.0–36.0)
MCV: 97.7 fL (ref 80.0–100.0)
Platelets: 292 10*3/uL (ref 150–400)
RBC: 3.42 MIL/uL — ABNORMAL LOW (ref 3.87–5.11)
RDW: 12.7 % (ref 11.5–15.5)
WBC: 12.9 10*3/uL — ABNORMAL HIGH (ref 4.0–10.5)
nRBC: 0 % (ref 0.0–0.2)

## 2023-03-14 LAB — URINALYSIS, ROUTINE W REFLEX MICROSCOPIC
Bilirubin Urine: NEGATIVE
Glucose, UA: NEGATIVE mg/dL
Hgb urine dipstick: NEGATIVE
Ketones, ur: 5 mg/dL — AB
Leukocytes,Ua: NEGATIVE
Nitrite: NEGATIVE
Protein, ur: NEGATIVE mg/dL
Specific Gravity, Urine: 1.011 (ref 1.005–1.030)
pH: 6 (ref 5.0–8.0)

## 2023-03-14 LAB — WET PREP, GENITAL
Clue Cells Wet Prep HPF POC: NONE SEEN
Sperm: NONE SEEN
Trich, Wet Prep: NONE SEEN
WBC, Wet Prep HPF POC: >=10 — AB (ref <10)
Yeast Wet Prep HPF POC: NONE SEEN

## 2023-03-14 LAB — FETAL FIBRONECTIN: Fetal Fibronectin: NEGATIVE

## 2023-03-14 MED ORDER — CYCLOBENZAPRINE HCL 10 MG PO TABS: 10.0000 mg | ORAL_TABLET | Freq: Three times a day (TID) | ORAL | 0 refills | Status: DC | PRN

## 2023-03-14 MED ORDER — CALCIUM CARBONATE ANTACID 500 MG PO CHEW: 2.0000 | CHEWABLE_TABLET | ORAL | Status: DC | PRN

## 2023-03-14 MED ORDER — ACETAMINOPHEN 325 MG PO TABS
650.0000 mg | ORAL_TABLET | ORAL | Status: DC | PRN
  Administered 2023-03-14: 650 mg via ORAL
  Filled 2023-03-14: qty 2

## 2023-03-14 MED ORDER — TERBUTALINE SULFATE 1 MG/ML IJ SOLN: 0.2500 mg | INTRAMUSCULAR | Status: DC | PRN

## 2023-03-14 MED ORDER — DOCUSATE SODIUM 100 MG PO CAPS: 100.0000 mg | ORAL_CAPSULE | Freq: Every day | ORAL | Status: DC

## 2023-03-14 MED ORDER — PRENATAL MULTIVITAMIN CH: 1.0000 | ORAL_TABLET | Freq: Every day | ORAL | Status: DC

## 2023-03-14 NOTE — OB Triage Note (Signed)
27 y.o G3P1011 presents to L&D triage at [redacted]w[redacted]d c/o pelvic pressure and abdominal pain. She reports having the pressure for about 2 days but says it's progressed since then. Pt denies vaginal bleeding, discharge, or LOF. Wilson CNM evaluated pt and reports her cervix as .5cm. Vaginal swabs and urine currently being processed in labs. Pt endorses good fetal movement.

## 2023-03-14 NOTE — Discharge Summary (Cosign Needed Addendum)
Brenda Osborn is a 27 y.o. female. She is at [redacted]w[redacted]d gestation. Patient's last menstrual period was 08/22/2022 (approximate). Estimated Date of Delivery: 06/09/23  Prenatal care site: [facility]  Current pregnancy complicated by:  - fetus diagnosed with [name] - chronic HTN - depression - obesity  Chief complaint: "urge to push"  Location: abdomen, back, and rectal pressure Onset/timing: she reports intermittent discomfort that started yesterday and has gotten progressively worse today until she is in constant pain with an urge to push Duration: since yesterday Quality: she reports pain along her lower abdomen radiating along both sides under her uterus and radiates into her lower back. 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 no CTX, no VB.no LOF,  Active fetal movement.  Denies: HA, visual changes, SOB, or RUQ/epigastric pain  Maternal Medical History:   Past Medical History:  Diagnosis Date   Choledocholithiasis 09/24/2021   Chronic migraine without aura, with intractable migraine, so stated, with status migrainosus    Hypertension    Supervision of high risk pregnancy in first trimester 10/24/2022   Formatting of this note is different from the original. 27 y.o. G3P1011 at  Patient's last menstrual period was 08/22/2022 (exact date). inconsistent with  with ultrasound on 10/24/22 @ [redacted]w[redacted]d .Estimated  Date of Delivery:06/09/23 Sex of baby and name:  " "   Partner:    Josh Factors complicating this pregnancy Chronic HTN (PO meds) Pre-pregnancy medications: Procardia 30 MG Daily Home BP monitor: check    Past Surgical History:  Procedure Laterality Date   GALLBLADDER SURGERY  09/30/2021   NO PAST SURGERIES      Allergies  Allergen Reactions   Zithromax [Azithromycin]     Respiratory Destress    Prior to Admission medications   Medication Sig Start Date End Date Taking? Authorizing Provider  aspirin 81 MG chewable tablet Chew 81 mg by mouth daily.   Yes [provider]  magnesium oxide (MAG-OX) 400 (240 Mg) MG tablet Take 400 mg by mouth daily.   Yes [provider]  Prenatal Vit-Fe Fumarate-FA (PRENATAL MULTIVITAMIN) TABS tablet Take 1 tablet by mouth daily at 12 noon.   Yes [provider]  promethazine (PHENERGAN) 25 MG tablet Take 25 mg by mouth every 6 (six) hours as needed for nausea or vomiting.   Yes [provider]      Social History: She  reports that she has never smoked. She has never used smokeless tobacco. She reports that she does not drink alcohol and does not use drugs.  Family History: family history includes Diabetes in her mother; Healthy in her sister and sister; Hypertension in her maternal grandmother.  no history of gyn cancers  Review of Systems: A full review of systems was performed and negative except as noted in the HPI.     O:  BP 119/69 (BP Location: Left Arm)   Pulse 95   Temp 98.1 F (  36.7 C) (Oral)   Resp 18   Ht 5\' 8"  (1.727 m)   LMP 08/22/2022 (Approximate)   BMI 36.34 kg/m  Results for orders placed or performed during the hospital encounter of 03/14/23 (from the past 48 hour(s))  Fetal fibronectin   Collection Time: 03/14/23 11:02 AM  Result Value Ref Range   Fetal Fibronectin NEGATIVE NEGATIVE  Urinalysis, Routine w reflex microscopic -Urine, Clean Catch   Collection Time: 03/14/23 11:02 AM   Result Value Ref Range   Color, Urine YELLOW (A) YELLOW   APPearance CLEAR (A) CLEAR   Specific Gravity, Urine 1.011 1.005 - 1.030   pH 6.0 5.0 - 8.0   Glucose, UA NEGATIVE NEGATIVE mg/dL   Hgb urine dipstick NEGATIVE NEGATIVE   Bilirubin Urine NEGATIVE NEGATIVE   Ketones, ur 5 (A) NEGATIVE mg/dL   Protein, ur NEGATIVE NEGATIVE mg/dL   Nitrite NEGATIVE NEGATIVE   Leukocytes,Ua NEGATIVE NEGATIVE  Wet prep, genital   Collection Time: 03/14/23 11:28 AM  Result Value Ref Range   Yeast Wet Prep HPF POC NONE SEEN NONE SEEN   Trich, Wet Prep NONE SEEN NONE SEEN   Clue Cells Wet Prep HPF POC NONE SEEN NONE SEEN   WBC, Wet Prep HPF POC >=10 (A) <10   Sperm NONE SEEN   Comprehensive metabolic panel   Collection Time: 03/14/23  2:01 PM  Result Value Ref Range   Sodium 137 135 - 145 mmol/L   Potassium 3.5 3.5 - 5.1 mmol/L   Chloride 106 98 - 111 mmol/L   CO2 23 22 - 32 mmol/L   Glucose, Bld 76 70 - 99 mg/dL   BUN 8 6 - 20 mg/dL   Creatinine, Ser 6.04 0.44 - 1.00 mg/dL   Calcium 8.7 (L) 8.9 - 10.3 mg/dL   Total Protein 6.6 6.5 - 8.1 g/dL   Albumin 3.1 (L) 3.5 - 5.0 g/dL   AST 19 15 - 41 U/L   ALT 18 0 - 44 U/L   Alkaline Phosphatase 97 38 - 126 U/L   Total Bilirubin 0.5 0.3 - 1.2 mg/dL   GFR, Estimated >54 >09 mL/min   Anion gap 8 5 - 15  CBC   Collection Time: 03/14/23  5:49 PM  Result Value Ref Range   WBC 12.9 (H) 4.0 - 10.5 K/uL   RBC 3.42 (L) 3.87 - 5.11 MIL/uL   Hemoglobin 11.2 (L) 12.0 - 15.0 g/dL   HCT 81.1 (L) 91.4 - 78.2 %   MCV 97.7 80.0 - 100.0 fL   MCH 32.7 26.0 - 34.0 pg   MCHC 33.5 30.0 - 36.0 g/dL   RDW 95.6 21.3 - 08.6 %   Platelets 292 150 - 400 K/uL   nRBC 0.0 0.0 - 0.2 %     OB US:  Number of Fetuses: 1   Heart Rate:  121 bpm   Movement: Yes   Presentation: Breech   Previa: No   Placental Location: Posterior   Amniotic Fluid (Subjective): Normal   Amniotic Fluid (Objective):   AFI = 14.6 cm (5%ile= 9.2 cm, 95%= 23.1 cm for 29 wks)    FETAL BIOMETRY   BPD: 6.98cm 28w 0d   Assigned GA:  27w 4d Assigned EDC: 06/09/2023   Maternal Findings:   Cervix:  Closed.  3.6 cm.   Uterus/adnexa: Adnexa are within normal limits.   IMPRESSION: 1. Single live intrauterine gestation in breech position. 2. Cervix is closed. 3. No acute abnormality.  RUQ [place]:  CLINICAL DATA:  Colicky pain affecting pregnancy in third trimester. Back pain and abdominal pain. History of cholecystectomy 09/2021.   EXAM: ULTRASOUND ABDOMEN LIMITED RIGHT UPPER QUADRANT   COMPARISON:  Complete abdominal ultrasound 10/04/2019   FINDINGS: Gallbladder:   The gallbladder is surgically absent.   Common bile duct:   Diameter: 3 mm, within normal limits. No intrahepatic or extrahepatic biliary ductal dilatation.   Liver:   No focal lesion identified. Within normal limits in parenchymal echogenicity. Portal vein is patent on color Doppler imaging with normal direction of blood flow towards the liver.   Other: None.   IMPRESSION: Interval cholecystectomy. Otherwise, unremarkable right upper quadrant abdominal ultrasound.  Renal US:  CLINICAL DATA:  Colicky pain. Back pain affecting pregnancy in third trimester. Abdominal pain.   EXAM: RENAL / URINARY TRACT ULTRASOUND COMPLETE   COMPARISON:  Complete abdominal ultrasound 10/04/2019   FINDINGS: Right Kidney:   Renal measurements: 11.4 x 5.2 x 5.5 cm = volume: 168 mL. Echogenicity within normal limits. No mass or hydronephrosis visualized.   Left Kidney:   Renal measurements: 12.2 x 5.4 x 5.3 cm = volume: 183 mL. Echogenicity within normal limits. No mass or hydronephrosis visualized.   Bladder:   Appears normal for degree of bladder distention. Bilateral ureteral jets are visualized.   Other:   None.   IMPRESSION: Normal renal ultrasound.  Constitutional: NAD, AAOx3  HE/ENT: extraocular movements grossly intact, moist mucous membranes CV: RRR PULM: nl respiratory  effort, CTABL     Abd: gravid, non-tender, non-distended, soft , no rebound tenderness, negative McBurney point     Ext: Non-tender, Nonedematous   Psych: mood appropriate, speech normal Pelvic: SSE done  Pelvic exam: normal external genitalia, vulva, vagina, cervix, uterus and adnexa.  Fetal  monitoring: Cat 1 Appropriate for GA Baseline: 130 Variability: moderate Accelerations: present x >2 Decelerations absent Contractions: rare  A/P: 27 y.o. [redacted]w[redacted]d here for antenatal surveillance for abdominal pain/back pain  Principle Diagnosis:  Musculoskeletal pain  Labor: not present.  Abdominal pain: no rebound tenderness, no fever, negative McBurney point, normal [place], no evidence of appendicitis, gall stones,  Back pain: negative UA, negative kidney ultrasound, no evidence of kidney stones or UTI. All testing is negative. Musculoskeletal pain, Rx for Flexeril TID PRN muscle pain sent to her pharmacy. Fetal Wellbeing: Reassuring Cat 1 tracing. Reassuring OB US. Reactive NST  D/c home stable, precautions reviewed, follow-up as scheduled.   Janyce Llanos, CNM 03/14/2023 7:12 PM

## 2023-03-14 NOTE — Progress Notes (Signed)
Patient discharged home per order. An After Visit Summary was printed and given to the patient. Discharge education completed with patient and support person including follow up instructions, appointments, and medication list. She received labor and bleeding precautions. Patient verbalized understanding and reported no questions. Patient instructed to return to ED, call 911, or call provider for any changes in condition. Patient ambulated off unit with support person at side.

## 2023-03-14 NOTE — Progress Notes (Signed)
TRIAGE NOTE to rule out Preterm Labor   History of Present Illness: Brenda Osborn is a 27 y.o. G3P1011 at [redacted]w[redacted]d presenting to triage for preterm pain.  Bilateral lower abdominal cramping, bilateral lower back cramping, nothing makes it better or worse, minimally improved with right side lying. No n/v/d/c or anorexia. No fever. Works as Museum/gallery exhibitions officer. Good fetal movement. Strong pain, disrupting ADLs. S/p cholecystectomy. No RLQ pain.  Hx of cHTN.  Patient Active Problem List   Diagnosis Date Noted   Preterm contractions 03/14/2023   B12 deficiency 12/18/2021   Vitamin D deficiency 12/18/2021   Chronic hypertension affecting pregnancy 05/26/2020   Type A blood, Rh positive 05/12/2020   GAD (generalized anxiety disorder) 11/04/2019   Hidradenitis suppurativa 10/03/2017    Past Medical History:  Diagnosis Date   Choledocholithiasis 09/24/2021   Chronic migraine without aura, with intractable migraine, so stated, with status migrainosus    Hypertension    Supervision of high risk pregnancy in first trimester 10/24/2022   Formatting of this note is different from the original. 27 y.o. G3P1011 at  Patient's last menstrual period was 08/22/2022 (exact date). inconsistent with  with ultrasound on 10/24/22 @ [redacted]w[redacted]d .Estimated Date of Delivery:06/09/23 Sex of baby and name:  " "   Partner:    Josh Factors complicating this pregnancy Chronic HTN (PO meds) Pre-pregnancy medications: Procardia 30 MG Daily Home BP monitor: check    Past Surgical History:  Procedure Laterality Date   GALLBLADDER SURGERY  09/30/2021   NO PAST SURGERIES      OB History  Gravida Para Term Preterm AB Living  3 1 1  0 1 1  SAB IAB Ectopic Multiple Live Births  1 0 0 0 1    # Outcome Date GA Lbr Len/2nd Weight Sex Delivery Anes PTL Lv  3 Current           2 Term 11/19/20 [redacted]w[redacted]d 02:45 / 00:31 2660 g F Vag-Spont EPI  LIV     Birth Comments: Single Artery Umbilical Cord  1 SAB 08/2019            Social History    Socioeconomic History   Marital status: Significant Other    Spouse name: Josh   Number of children: 1   Years of education: Associates Degree   Highest education level: Not on file  Occupational History   Occupation: EMS    Comment: [employer]  Tobacco Use   Smoking status: Never   Smokeless tobacco: Never  Vaping Use   Vaping Use: Never used  Substance and Sexual Activity   Alcohol use: No   Drug use: No   Sexual activity: Yes    Partners: Male  Other Topics Concern   Not on file  Social History Narrative   Not on file   Social Determinants of Health   Financial Resource Strain: Not on file  Food Insecurity: Not on file  Transportation Needs: Not on file  Physical Activity: Not on file  Stress: Not on file  Social Connections: Not on file    Family History  Problem Relation Age of Onset   Diabetes Mother        "Pre Diabetes"   Healthy Sister    Healthy Sister    Hypertension Maternal Grandmother    Breast cancer Neg Hx    Ovarian cancer Neg Hx    Colon cancer Neg Hx     Allergies  Allergen Reactions   Zithromax [Azithromycin]  Respiratory Destress    Medications Prior to Admission  Medication Sig Dispense Refill Last Dose   aspirin 81 MG chewable tablet Chew 81 mg by mouth daily.   03/14/2023   magnesium oxide (MAG-OX) 400 (240 Mg) MG tablet Take 400 mg by mouth daily.   Past Month   Prenatal Vit-Fe Fumarate-FA (PRENATAL MULTIVITAMIN) TABS tablet Take 1 tablet by mouth daily at 12 noon.   03/14/2023   promethazine (PHENERGAN) 25 MG tablet Take 25 mg by mouth every 6 (six) hours as needed for nausea or vomiting.   Past Month    Review of Systems - See HPI for OB specific ROS.   Vitals:  BP 118/68 (BP Location: Left Arm)   Pulse 88   Temp 98.4 F (36.9 C) (Oral)   Resp 18   Ht 5\' 8"  (1.727 m)   LMP 08/22/2022 (Approximate)   BMI 36.34 kg/m  Physical Examination: CONSTITUTIONAL: Well-developed, well-nourished female in no acute  distress.  HENT:  Normocephalic, atraumatic EYES: Conjunctivae and EOM are normal. No scleral icterus.  NECK: Normal range of motion, supple, SKIN: Skin is warm and dry. No rash noted. Not diaphoretic. No erythema. No pallor. NEUROLGIC: Alert and oriented to person, place, and time. No gross cranial nerve deficit noted. PSYCHIATRIC: Normal mood and affect. Normal behavior. Normal judgment and thought content. CARDIOVASCULAR: Normal heart rate noted, regular rhythm RESPIRATORY: Effort and breath sounds normal, no problems with respiration noted ABDOMEN: Soft, nontender, nondistended, gravid. Neg McBurney's point, neg Charcot's triad, no CVA tenderness.  Cervix: closed/thick/high Membranes:intact Fetal Monitoring:Baseline: 150 bpm, Variability: Good {> 6 bpm), Accelerations: Reactive, and Decelerations: Absent Tocometer: Flat  Labs:  Results for orders placed or performed during the hospital encounter of 03/14/23 (from the past 24 hour(s))  Fetal fibronectin   Collection Time: 03/14/23 11:02 AM  Result Value Ref Range   Fetal Fibronectin NEGATIVE NEGATIVE  Urinalysis, Routine w reflex microscopic -Urine, Clean Catch   Collection Time: 03/14/23 11:02 AM  Result Value Ref Range   Color, Urine YELLOW (A) YELLOW   APPearance CLEAR (A) CLEAR   Specific Gravity, Urine 1.011 1.005 - 1.030   pH 6.0 5.0 - 8.0   Glucose, UA NEGATIVE NEGATIVE mg/dL   Hgb urine dipstick NEGATIVE NEGATIVE   Bilirubin Urine NEGATIVE NEGATIVE   Ketones, ur 5 (A) NEGATIVE mg/dL   Protein, ur NEGATIVE NEGATIVE mg/dL   Nitrite NEGATIVE NEGATIVE   Leukocytes,Ua NEGATIVE NEGATIVE  Wet prep, genital   Collection Time: 03/14/23 11:28 AM  Result Value Ref Range   Yeast Wet Prep HPF POC NONE SEEN NONE SEEN   Trich, Wet Prep NONE SEEN NONE SEEN   Clue Cells Wet Prep HPF POC NONE SEEN NONE SEEN   WBC, Wet Prep HPF POC >=10 (A) <10   Sperm NONE SEEN   Comprehensive metabolic panel   Collection Time: 03/14/23  2:01 PM   Result Value Ref Range   Sodium 137 135 - 145 mmol/L   Potassium 3.5 3.5 - 5.1 mmol/L   Chloride 106 98 - 111 mmol/L   CO2 23 22 - 32 mmol/L   Glucose, Bld 76 70 - 99 mg/dL   BUN 8 6 - 20 mg/dL   Creatinine, Ser 3.24 0.44 - 1.00 mg/dL   Calcium 8.7 (L) 8.9 - 10.3 mg/dL   Total Protein 6.6 6.5 - 8.1 g/dL   Albumin 3.1 (L) 3.5 - 5.0 g/dL   AST 19 15 - 41 U/L   ALT 18 0 - 44  U/L   Alkaline Phosphatase 97 38 - 126 U/L   Total Bilirubin 0.5 0.3 - 1.2 mg/dL   GFR, Estimated >16 >10 mL/min   Anion gap 8 5 - 15    Imaging Studies: US RENAL  Result Date: 03/14/2023 CLINICAL DATA:  Colicky pain. Back pain affecting pregnancy in third trimester. Abdominal pain. EXAM: RENAL / URINARY TRACT ULTRASOUND COMPLETE COMPARISON:  Complete abdominal ultrasound 10/04/2019 FINDINGS: Right Kidney: Renal measurements: 11.4 x 5.2 x 5.5 cm = volume: 168 mL. Echogenicity within normal limits. No mass or hydronephrosis visualized. Left Kidney: Renal measurements: 12.2 x 5.4 x 5.3 cm = volume: 183 mL. Echogenicity within normal limits. No mass or hydronephrosis visualized. Bladder: Appears normal for degree of bladder distention. Bilateral ureteral jets are visualized. Other: None. IMPRESSION: Normal renal ultrasound. Electronically Signed   By: Neita Garnet M.D.   On: 03/14/2023 16:54   US Abdomen Limited RUQ (LIVER/GB)  Result Date: 03/14/2023 CLINICAL DATA:  Colicky pain affecting pregnancy in third trimester. Back pain and abdominal pain. History of cholecystectomy 09/2021. EXAM: ULTRASOUND ABDOMEN LIMITED RIGHT UPPER QUADRANT COMPARISON:  Complete abdominal ultrasound 10/04/2019 FINDINGS: Gallbladder: The gallbladder is surgically absent. Common bile duct: Diameter: 3 mm, within normal limits. No intrahepatic or extrahepatic biliary ductal dilatation. Liver: No focal lesion identified. Within normal limits in parenchymal echogenicity. Portal vein is patent on color Doppler imaging with normal direction of blood  flow towards the liver. Other: None. IMPRESSION: Interval cholecystectomy. Otherwise, unremarkable right upper quadrant abdominal ultrasound. Electronically Signed   By: Neita Garnet M.D.   On: 03/14/2023 16:51     Assessment and Plan: Patient Active Problem List   Diagnosis Date Noted   Preterm contractions 03/14/2023   B12 deficiency 12/18/2021   Vitamin D deficiency 12/18/2021   Chronic hypertension affecting pregnancy 05/26/2020   Type A blood, Rh positive 05/12/2020   GAD (generalized anxiety disorder) 11/04/2019   Hidradenitis suppurativa 10/03/2017    1. Ddx preterm labor, vaginal infection, appendicitis, cholecystitis, placental abruption, pyleonephritis, ruptured ovarian cyst, MSK fatigue. - cervix closed, FFN neg - wet prep neg for yeast, bv, trich - RUQ wnl - no evidence of appendicitis on exam - will check CBC and fetal ultrasound. Reassurance given - NST surprisingly normal and reactive  Cline Cools, MD, MPH

## 2023-03-15 LAB — URINE CULTURE: Culture: NO GROWTH

## 2023-03-17 ENCOUNTER — Other Ambulatory Visit: Payer: Self-pay

## 2023-03-17 DIAGNOSIS — O99210 Obesity complicating pregnancy, unspecified trimester: Secondary | ICD-10-CM

## 2023-03-17 DIAGNOSIS — O10919 Unspecified pre-existing hypertension complicating pregnancy, unspecified trimester: Secondary | ICD-10-CM

## 2023-03-17 DIAGNOSIS — Q9389 Other deletions from the autosomes: Secondary | ICD-10-CM

## 2023-03-19 ENCOUNTER — Ambulatory Visit: Payer: BC Managed Care – PPO | Attending: Obstetrics and Gynecology

## 2023-03-19 ENCOUNTER — Other Ambulatory Visit: Payer: Self-pay

## 2023-03-19 DIAGNOSIS — O99213 Obesity complicating pregnancy, third trimester: Secondary | ICD-10-CM | POA: Insufficient documentation

## 2023-03-19 DIAGNOSIS — E669 Obesity, unspecified: Secondary | ICD-10-CM

## 2023-03-19 DIAGNOSIS — O10913 Unspecified pre-existing hypertension complicating pregnancy, third trimester: Secondary | ICD-10-CM | POA: Diagnosis present

## 2023-03-19 DIAGNOSIS — O3515X0 Maternal care for (suspected) chromosomal abnormality in fetus, sex chromosome abnormality, not applicable or unspecified: Secondary | ICD-10-CM

## 2023-03-19 DIAGNOSIS — Q9389 Other deletions from the autosomes: Secondary | ICD-10-CM

## 2023-03-19 DIAGNOSIS — O10013 Pre-existing essential hypertension complicating pregnancy, third trimester: Secondary | ICD-10-CM

## 2023-03-19 DIAGNOSIS — O3663X0 Maternal care for excessive fetal growth, third trimester, not applicable or unspecified: Secondary | ICD-10-CM | POA: Insufficient documentation

## 2023-03-19 DIAGNOSIS — O99210 Obesity complicating pregnancy, unspecified trimester: Secondary | ICD-10-CM

## 2023-03-19 DIAGNOSIS — Z3A28 28 weeks gestation of pregnancy: Secondary | ICD-10-CM | POA: Diagnosis not present

## 2023-03-19 DIAGNOSIS — O10919 Unspecified pre-existing hypertension complicating pregnancy, unspecified trimester: Secondary | ICD-10-CM

## 2023-03-31 DIAGNOSIS — Z419 Encounter for procedure for purposes other than remedying health state, unspecified: Secondary | ICD-10-CM | POA: Diagnosis not present

## 2023-04-22 DIAGNOSIS — Z3483 Encounter for supervision of other normal pregnancy, third trimester: Secondary | ICD-10-CM | POA: Diagnosis not present

## 2023-04-22 DIAGNOSIS — Z3482 Encounter for supervision of other normal pregnancy, second trimester: Secondary | ICD-10-CM | POA: Diagnosis not present

## 2023-04-29 ENCOUNTER — Encounter: Payer: Self-pay | Admitting: Obstetrics and Gynecology

## 2023-04-29 ENCOUNTER — Other Ambulatory Visit: Payer: Self-pay

## 2023-04-29 ENCOUNTER — Observation Stay
Admission: EM | Admit: 2023-04-29 | Discharge: 2023-04-29 | Disposition: A | Discharge status: Home / Self Care | Payer: BC Managed Care – PPO

## 2023-04-29 DIAGNOSIS — T1490XA Injury, unspecified, initial encounter: Secondary | ICD-10-CM | POA: Insufficient documentation

## 2023-04-29 DIAGNOSIS — O4703 False labor before 37 completed weeks of gestation, third trimester: Secondary | ICD-10-CM | POA: Diagnosis not present

## 2023-04-29 DIAGNOSIS — Z3A34 34 weeks gestation of pregnancy: Secondary | ICD-10-CM | POA: Diagnosis not present

## 2023-04-29 DIAGNOSIS — W19XXXA Unspecified fall, initial encounter: Principal | ICD-10-CM

## 2023-04-29 DIAGNOSIS — O10013 Pre-existing essential hypertension complicating pregnancy, third trimester: Secondary | ICD-10-CM | POA: Diagnosis not present

## 2023-04-29 DIAGNOSIS — Y92009 Unspecified place in unspecified non-institutional (private) residence as the place of occurrence of the external cause: Secondary | ICD-10-CM | POA: Diagnosis not present

## 2023-04-29 DIAGNOSIS — Z7982 Long term (current) use of aspirin: Secondary | ICD-10-CM | POA: Insufficient documentation

## 2023-04-29 DIAGNOSIS — W1839XA Other fall on same level, initial encounter: Secondary | ICD-10-CM | POA: Diagnosis not present

## 2023-04-29 DIAGNOSIS — O1203 Gestational edema, third trimester: Secondary | ICD-10-CM | POA: Insufficient documentation

## 2023-04-29 DIAGNOSIS — O9A213 Injury, poisoning and certain other consequences of external causes complicating pregnancy, third trimester: Principal | ICD-10-CM | POA: Insufficient documentation

## 2023-04-29 DIAGNOSIS — O10919 Unspecified pre-existing hypertension complicating pregnancy, unspecified trimester: Secondary | ICD-10-CM | POA: Diagnosis present

## 2023-04-29 LAB — PROTEIN / CREATININE RATIO, URINE
Creatinine, Urine: 180 mg/dL
Protein Creatinine Ratio: 0.09 mg/mg{Cre} (ref 0.00–0.15)
Total Protein, Urine: 17 mg/dL

## 2023-04-29 LAB — COMPREHENSIVE METABOLIC PANEL
ALT: 16 U/L (ref 0–44)
AST: 17 U/L (ref 15–41)
Albumin: 2.7 g/dL — ABNORMAL LOW (ref 3.5–5.0)
Alkaline Phosphatase: 109 U/L (ref 38–126)
Anion gap: 8 (ref 5–15)
BUN: 7 mg/dL (ref 6–20)
CO2: 19 mmol/L — ABNORMAL LOW (ref 22–32)
Calcium: 8.4 mg/dL — ABNORMAL LOW (ref 8.9–10.3)
Chloride: 109 mmol/L (ref 98–111)
Creatinine, Ser: 0.57 mg/dL (ref 0.44–1.00)
GFR, Estimated: >60 mL/min (ref >60)
Glucose, Bld: 97 mg/dL (ref 70–99)
Potassium: 3.6 mmol/L (ref 3.5–5.1)
Sodium: 136 mmol/L (ref 135–145)
Total Bilirubin: 0.3 mg/dL (ref 0.3–1.2)
Total Protein: 5.7 g/dL — ABNORMAL LOW (ref 6.5–8.1)

## 2023-04-29 LAB — CBC
HCT: 33.5 % — ABNORMAL LOW (ref 36.0–46.0)
Hemoglobin: 11.6 g/dL — ABNORMAL LOW (ref 12.0–15.0)
MCH: 33 pg (ref 26.0–34.0)
MCHC: 34.6 g/dL (ref 30.0–36.0)
MCV: 95.4 fL (ref 80.0–100.0)
Platelets: 241 10*3/uL (ref 150–400)
RBC: 3.51 MIL/uL — ABNORMAL LOW (ref 3.87–5.11)
RDW: 12.3 % (ref 11.5–15.5)
WBC: 9.7 10*3/uL (ref 4.0–10.5)
nRBC: 0 % (ref 0.0–0.2)

## 2023-04-29 MED ORDER — CALCIUM CARBONATE ANTACID 500 MG PO CHEW: 2.0000 | CHEWABLE_TABLET | ORAL | Status: DC | PRN

## 2023-04-29 MED ORDER — ACETAMINOPHEN 500 MG PO TABS: 1000.0000 mg | ORAL_TABLET | Freq: Four times a day (QID) | ORAL | Status: DC | PRN

## 2023-04-29 MED ORDER — MAGNESIUM OXIDE -MG SUPPLEMENT 400 (240 MG) MG PO TABS
800.0000 mg | ORAL_TABLET | Freq: Once | ORAL | Status: AC
  Administered 2023-04-29: 800 mg via ORAL
  Filled 2023-04-29: qty 2

## 2023-04-29 MED ORDER — ACETAMINOPHEN 500 MG PO TABS
1000.0000 mg | ORAL_TABLET | Freq: Four times a day (QID) | ORAL | Status: DC | PRN
  Administered 2023-04-29: 1000 mg via ORAL
  Filled 2023-04-29: qty 2

## 2023-04-29 NOTE — OB Triage Note (Addendum)
Pt Brenda Osborn 27 y.o. presents to labor and delivery triage reporting a fall that happened around 9am. She stated that she accidentally fell while helping her daughter with a toy. She landed on her right side and it "jarred" her. She has been feeling pelvic cramping on and off throughout her pregnancy, but after the fall the pain became more intense. She has aching pain on the right side. She has a headache that she rates as 6/10 - hx of chronic headaches and migraines. Pt is a G3P1011 at [redacted]w[redacted]d . Pt denies signs and symptoms consistent with rupture of membranes or active vaginal bleeding. Pt denies consistent contractions and states positive fetal movement. External FM and TOCO applied to non-tender abdomen and assessing. Initial FHR 130. Vital signs obtained and within normal limits. Provider notified of pt.

## 2023-04-29 NOTE — OB Triage Note (Signed)

## 2023-04-29 NOTE — Discharge Summary (Signed)
Brenda Osborn is a 27 y.o. female. She is at [redacted]w[redacted]d gestation. Patient's last menstrual period was 08/22/2022 (approximate). 06/09/2023, by Ultrasound   Prenatal care site: [facility]  Chief complaint: fall at home   Admission Diagnoses:  1) intrauterine pregnancy at [redacted]w[redacted]d  2) Fall at home, initial encounter [W19.XXXA, Y92.009] 3) Chronic hypertension in pregnancy   Discharge Diagnoses:  Principal Problem:   Fall at home, initial encounter Active Problems:   Chronic hypertension affecting pregnancy  HPI: [name] presents to L&D with complaints of falling at home.  She was reaching for a toy for her daughter and fell on her right side.  She denies direct impact to her abdomen.  She's been having cramping on and off since last week but cramping became worse after falling.  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  She denies Loss of fluid or Vaginal bleeding. Endorses fetal movement as active. On arrival to [facility] triage she had a headache that was 6/10 that she had not taken any medication for yet.  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   Maternal Medical History:  Past Medical Hx:  has a past medical history of Choledocholithiasis (09/24/2021), Chronic migraine without aura, with intractable migraine, so stated, with status migrainosus, Hypertension, and Supervision of high risk pregnancy in first trimester (10/24/2022).    Past Surgical Hx:  has a past surgical history that includes No past surgeries and Gallbladder surgery (09/30/2021).   Allergies  Allergen Reactions   Zithromax [Azithromycin]     Respiratory Destress     Prior to Admission medications   Medication Sig Start Date End Date Taking? Authorizing Provider  acetaminophen (TYLENOL)  500 MG tablet Take 2 tablets (1,000 mg total) by mouth every 6 (six) hours as needed (for pain scale < 4  OR  temperature  >/=  100.5 F). 04/29/23   Gustavo Lah, CNM  aspirin 81 MG chewable tablet Chew 81 mg by mouth daily.    [provider]  cyclobenzaprine (FLEXERIL) 10 MG tablet Take 1 tablet (10 mg total) by mouth 3 (three) times daily as needed for muscle spasms. Patient not taking: Reported on 04/29/2023 03/14/23   Janyce Llanos, CNM  magnesium oxide (MAG-OX) 400 (240 Mg) MG tablet Take 400 mg by mouth daily.    [provider]  Prenatal Vit-Fe Fumarate-FA (PRENATAL MULTIVITAMIN) TABS tablet Take 1 tablet by mouth daily at 12 noon.    [provider]  promethazine (PHENERGAN) 25 MG tablet Take 25 mg by mouth every 6 (six) hours as needed for nausea or vomiting. Patient not taking: Reported on 04/29/2023    [provider]    Social History: She  reports that she has never smoked. She has never used smokeless tobacco. She reports that she does not drink alcohol and does not use drugs.  Family History: family history includes Diabetes in her mother; Healthy in her sister and sister; Hypertension in her maternal grandmother.   Review of Systems: A full review of systems was performed and negative except as noted in the HPI.     Pertinent Results:   O:  BP 133/86 (BP Location: Left Arm)   Pulse 87   Temp 99.3 F (37.4 C) (Oral)   Resp 18   Ht 5\' 8"  (1.727  m)   Wt 115.2 kg   LMP 08/22/2022 (Approximate)   BMI 38.62 kg/m  Results for orders placed or performed during the hospital encounter of 04/29/23 (from the past 48 hour(s))  Comprehensive metabolic panel   Collection Time: 04/29/23  1:31 PM  Result Value Ref Range   Sodium 136 135 - 145 mmol/L   Potassium 3.6 3.5 - 5.1 mmol/L   Chloride 109 98 - 111 mmol/L   CO2 19 (L) 22 - 32 mmol/L   Glucose, Bld 97 70 - 99 mg/dL   BUN 7 6 - 20 mg/dL   Creatinine, Ser 4.09 0.44 - 1.00 mg/dL    Calcium 8.4 (L) 8.9 - 10.3 mg/dL   Total Protein 5.7 (L) 6.5 - 8.1 g/dL   Albumin 2.7 (L) 3.5 - 5.0 g/dL   AST 17 15 - 41 U/L   ALT 16 0 - 44 U/L   Alkaline Phosphatase 109 38 - 126 U/L   Total Bilirubin 0.3 0.3 - 1.2 mg/dL   GFR, Estimated >81 >19 mL/min   Anion gap 8 5 - 15  CBC   Collection Time: 04/29/23  1:31 PM  Result Value Ref Range   WBC 9.7 4.0 - 10.5 K/uL   RBC 3.51 (L) 3.87 - 5.11 MIL/uL   Hemoglobin 11.6 (L) 12.0 - 15.0 g/dL   HCT 14.7 (L) 82.9 - 56.2 %   MCV 95.4 80.0 - 100.0 fL   MCH 33.0 26.0 - 34.0 pg   MCHC 34.6 30.0 - 36.0 g/dL   RDW 13.0 86.5 - 78.4 %   Platelets 241 150 - 400 K/uL   nRBC 0.0 0.0 - 0.2 %  Protein / creatinine ratio, urine   Collection Time: 04/29/23  1:31 PM  Result Value Ref Range   Creatinine, Urine 180 mg/dL   Total Protein, Urine 17 mg/dL   Protein Creatinine Ratio 0.09 0.00 - 0.15 mg/mg[Cre]     Constitutional: NAD, AAOx3  PULM: nl respiratory effort Abd: gravid, non-tender Ext: Non-tender, 1+ bilateral pitting edema  Psych: mood appropriate, speech normal Pelvic : deferred   NST: Baseline FHR: 130 beats/min Variability: moderate Accelerations: present Decelerations: absent Tocometry: Occasional, mild contractions  Time: at least 20 minutes   Interpretation: Category I INDICATIONS: fall at home, chronic HTN, obesity, abnormal aneuploidy  RESULTS:  A NST procedure was performed with FHR monitoring and a normal baseline established, appropriate time of 20-40 minutes of evaluation, and accels >2 seen w 15x15 characteristics.  Results show a REACTIVE NST.   Consults:  None  Procedures: None  Hospital Course: The patient was admitted to Labor and Delivery Triage for observation. She was monitored for at least 2 hours and greater than 4 hours from her fall at 9:00 am this morning.  She did not have any vaginal bleeding or LOF.  Reported active fetal movement. Her headache improved with Tylenol and magnesium and PreE labs were  normal.  Reviewed comfort measures for home and when to return to L&D. She was deemed stable for discharge and close outpatient follow up.   Plan: 1) Reactive NST  -Category 1 tracing  -Reassuring fetal status   2) S/P fall -Discussed warning signs to return to L&D triage with  -Reviewed comfort measures  -Work note provided to excuse from work until 05/01/2023  Discharge Condition: stable  Disposition: Discharge disposition: 01-Home or Self Care        Allergies as of 04/29/2023       Reactions   Zithromax [  azithromycin]    Respiratory Destress        Medication List     STOP taking these medications    cyclobenzaprine 10 MG tablet Commonly known as: FLEXERIL   promethazine 25 MG tablet Commonly known as: PHENERGAN       TAKE these medications    acetaminophen 500 MG tablet Commonly known as: TYLENOL Take 2 tablets (1,000 mg total) by mouth every 6 (six) hours as needed (for pain scale < 4  OR  temperature  >/=  100.5 F).   aspirin 81 MG chewable tablet Chew 81 mg by mouth daily.   magnesium oxide 400 (240 Mg) MG tablet Commonly known as: MAG-OX Take 400 mg by mouth daily.   prenatal multivitamin Tabs tablet Take 1 tablet by mouth daily at 12 noon.        Follow-up Information     [facility]. Schedule an appointment as soon as possible for a visit in 1 week(s).   Why: routine prenatal appointment Contact information: [address] [place] [address] [phone]               ----- Margaretmary Eddy, CNM Certified Nurse Midwife [facility]  [facility]

## 2023-05-01 DIAGNOSIS — Z419 Encounter for procedure for purposes other than remedying health state, unspecified: Secondary | ICD-10-CM | POA: Diagnosis not present

## 2023-05-04 ENCOUNTER — Other Ambulatory Visit: Payer: Self-pay

## 2023-05-04 ENCOUNTER — Encounter: Payer: Self-pay | Admitting: Obstetrics and Gynecology

## 2023-05-04 ENCOUNTER — Observation Stay
Admission: EM | Admit: 2023-05-04 | Discharge: 2023-05-04 | Disposition: A | Discharge status: Home / Self Care | Payer: BC Managed Care – PPO | Attending: Obstetrics and Gynecology | Admitting: Obstetrics and Gynecology

## 2023-05-04 DIAGNOSIS — Z79899 Other long term (current) drug therapy: Secondary | ICD-10-CM | POA: Diagnosis not present

## 2023-05-04 DIAGNOSIS — O23593 Infection of other part of genital tract in pregnancy, third trimester: Principal | ICD-10-CM | POA: Insufficient documentation

## 2023-05-04 DIAGNOSIS — O10013 Pre-existing essential hypertension complicating pregnancy, third trimester: Secondary | ICD-10-CM | POA: Diagnosis not present

## 2023-05-04 DIAGNOSIS — N898 Other specified noninflammatory disorders of vagina: Principal | ICD-10-CM | POA: Diagnosis present

## 2023-05-04 DIAGNOSIS — Z7982 Long term (current) use of aspirin: Secondary | ICD-10-CM | POA: Diagnosis not present

## 2023-05-04 DIAGNOSIS — Z3A34 34 weeks gestation of pregnancy: Secondary | ICD-10-CM | POA: Diagnosis not present

## 2023-05-04 LAB — COMPREHENSIVE METABOLIC PANEL
ALT: 15 U/L (ref 0–44)
AST: 17 U/L (ref 15–41)
Albumin: 2.6 g/dL — ABNORMAL LOW (ref 3.5–5.0)
Alkaline Phosphatase: 123 U/L (ref 38–126)
Anion gap: 7 (ref 5–15)
BUN: 6 mg/dL (ref 6–20)
CO2: 21 mmol/L — ABNORMAL LOW (ref 22–32)
Calcium: 8.5 mg/dL — ABNORMAL LOW (ref 8.9–10.3)
Chloride: 109 mmol/L (ref 98–111)
Creatinine, Ser: 0.78 mg/dL (ref 0.44–1.00)
GFR, Estimated: >60 mL/min (ref >60)
Glucose, Bld: 89 mg/dL (ref 70–99)
Potassium: 3.4 mmol/L — ABNORMAL LOW (ref 3.5–5.1)
Sodium: 137 mmol/L (ref 135–145)
Total Bilirubin: 0.5 mg/dL (ref 0.3–1.2)
Total Protein: 5.8 g/dL — ABNORMAL LOW (ref 6.5–8.1)

## 2023-05-04 LAB — CBC WITH DIFFERENTIAL/PLATELET
Abs Immature Granulocytes: 0.04 10*3/uL (ref 0.00–0.07)
Basophils Absolute: 0 10*3/uL (ref 0.0–0.1)
Basophils Relative: 0 %
Eosinophils Absolute: 0.1 10*3/uL (ref 0.0–0.5)
Eosinophils Relative: 1 %
HCT: 31.2 % — ABNORMAL LOW (ref 36.0–46.0)
Hemoglobin: 11 g/dL — ABNORMAL LOW (ref 12.0–15.0)
Immature Granulocytes: 1 %
Lymphocytes Relative: 21 %
Lymphs Abs: 1.7 10*3/uL (ref 0.7–4.0)
MCH: 33.1 pg (ref 26.0–34.0)
MCHC: 35.3 g/dL (ref 30.0–36.0)
MCV: 94 fL (ref 80.0–100.0)
Monocytes Absolute: 1 10*3/uL (ref 0.1–1.0)
Monocytes Relative: 13 %
Neutro Abs: 5.4 10*3/uL (ref 1.7–7.7)
Neutrophils Relative %: 64 %
Platelets: 235 10*3/uL (ref 150–400)
RBC: 3.32 MIL/uL — ABNORMAL LOW (ref 3.87–5.11)
RDW: 12 % (ref 11.5–15.5)
WBC: 8.3 10*3/uL (ref 4.0–10.5)
nRBC: 0 % (ref 0.0–0.2)

## 2023-05-04 LAB — PROTEIN / CREATININE RATIO, URINE
Creatinine, Urine: 102 mg/dL
Protein Creatinine Ratio: 0.12 mg/mg{Cre} (ref 0.00–0.15)
Total Protein, Urine: 12 mg/dL

## 2023-05-04 LAB — WET PREP, GENITAL
Clue Cells Wet Prep HPF POC: NONE SEEN
Sperm: NONE SEEN
Trich, Wet Prep: NONE SEEN
WBC, Wet Prep HPF POC: <10 — AB (ref <10)
Yeast Wet Prep HPF POC: NONE SEEN

## 2023-05-04 LAB — RUPTURE OF MEMBRANE (ROM)PLUS: Rom Plus: NEGATIVE

## 2023-05-04 MED ORDER — ACETAMINOPHEN 325 MG PO TABS: 650.0000 mg | ORAL_TABLET | ORAL | Status: DC | PRN

## 2023-05-04 MED ORDER — ZOLPIDEM TARTRATE 5 MG PO TABS: 5.0000 mg | ORAL_TABLET | Freq: Every evening | ORAL | Status: DC | PRN

## 2023-05-04 MED ORDER — DOCUSATE SODIUM 100 MG PO CAPS: 100.0000 mg | ORAL_CAPSULE | Freq: Every day | ORAL | Status: DC

## 2023-05-04 MED ORDER — ACETAMINOPHEN 500 MG PO TABS
ORAL_TABLET | ORAL | Status: AC
  Filled 2023-05-04: qty 2

## 2023-05-04 MED ORDER — LACTATED RINGERS IV SOLN: 125.0000 mL/h | INTRAVENOUS | Status: DC

## 2023-05-04 MED ORDER — ACETAMINOPHEN 500 MG PO TABS
1000.0000 mg | ORAL_TABLET | Freq: Four times a day (QID) | ORAL | Status: DC | PRN
  Administered 2023-05-04: 1000 mg via ORAL

## 2023-05-04 MED ORDER — PRENATAL MULTIVITAMIN CH: 1.0000 | ORAL_TABLET | Freq: Every day | ORAL | Status: DC

## 2023-05-04 MED ORDER — CALCIUM CARBONATE ANTACID 500 MG PO CHEW: 2.0000 | CHEWABLE_TABLET | ORAL | Status: DC | PRN

## 2023-05-04 NOTE — Discharge Summary (Signed)
Brenda Osborn is a 27 y.o. female. She is at [redacted]w[redacted]d gestation. Patient's last menstrual period was 08/22/2022 (approximate). Estimated Date of Delivery: 06/09/23  Prenatal care site: [facility]  Current pregnancy complicated by:  - chronic HTN (no medications) - [name]  - obesity - fetal macrosomia - anxiety and depression - migraines  Chief complaint: vaginal discharge/leaking fluid  She reports feeling a gush of fluid around 4pm and continued wetness and discharge since. She denies any vaginal bleeding or cramping. She also reports a headache today,  S: Resting comfortably. no CTX, no VB.no LOF,  Active fetal movement.  Denies: HA, visual changes, SOB, or RUQ/epigastric pain  Maternal Medical History:   Past Medical History:  Diagnosis Date   Choledocholithiasis 09/24/2021   Chronic migraine without aura, with intractable migraine, so stated, with status migrainosus    Hypertension    Supervision of high risk pregnancy in first trimester 10/24/2022   Formatting of this note is different from the original. 27 y.o. G3P1011 at  Patient's last menstrual period was 08/22/2022 (exact date). inconsistent with  with ultrasound on 10/24/22 @ [redacted]w[redacted]d .Estimated Date of Delivery:06/09/23 Sex of baby and name:  " "   Partner:    Josh Factors complicating this pregnancy Chronic HTN (PO meds) Pre-pregnancy medications: Procardia 30 MG Daily Home BP monitor: check    Past Surgical History:  Procedure Laterality Date   GALLBLADDER SURGERY  09/30/2021   NO PAST SURGERIES      Allergies  Allergen Reactions   Zithromax [Azithromycin]     Respiratory Destress    Prior to Admission medications   Medication Sig Start Date End Date Taking? Authorizing Provider  acetaminophen (TYLENOL) 500 MG tablet Take 2 tablets (1,000 mg total) by mouth every 6 (six) hours as needed (for pain scale < 4  OR  temperature  >/=  100.5 F). 04/29/23  Yes Gustavo Lah, CNM  aspirin 81 MG chewable  tablet Chew 81 mg by mouth daily.   Yes [provider]  magnesium oxide (MAG-OX) 400 (240 Mg) MG tablet Take 400 mg by mouth daily.   Yes [provider]  Prenatal Vit-Fe Fumarate-FA (PRENATAL MULTIVITAMIN) TABS tablet Take 1 tablet by mouth daily at 12 noon.   Yes [provider]      Social History: She  reports that she has never smoked. She has never used smokeless tobacco. She reports that she does not drink alcohol and does not use drugs.  Family History: family history includes Diabetes in her mother; Healthy in her sister and sister; Hypertension in her maternal grandmother.  no history of gyn cancers  Review of Systems: A full review of systems was performed and negative except as noted in the HPI.     O:  BP 133/80 (BP Location: Left Arm)   Pulse 83   Temp 98.7 F (37.1 C) (Oral)   Ht 5\' 8"  (1.727 m)   Wt 114.8 kg   LMP 08/22/2022 (Approximate)   BMI 38.47 kg/m  Results for orders placed or performed during the hospital encounter of 05/04/23 (from the past 48 hour(s))  Wet prep, genital   Collection Time: 05/04/23  7:43 PM  Result Value Ref Range   Yeast Wet Prep HPF POC NONE SEEN NONE SEEN   Trich, Wet Prep NONE SEEN NONE SEEN   Clue Cells Wet Prep HPF POC NONE SEEN NONE SEEN   WBC, Wet Prep HPF POC <10 (A) <10   Sperm NONE  SEEN   Rupture of Membrane (ROM) Plus   Collection Time: 05/04/23  7:43 PM  Result Value Ref Range   Rom Plus NEGATIVE   Protein / creatinine ratio, urine   Collection Time: 05/04/23  8:28 PM  Result Value Ref Range   Creatinine, Urine 102 mg/dL   Total Protein, Urine 12 mg/dL   Protein Creatinine Ratio 0.12 0.00 - 0.15 mg/mg[Cre]  Comprehensive metabolic panel   Collection Time: 05/04/23  8:35 PM  Result Value Ref Range   Sodium 137 135 - 145 mmol/L   Potassium 3.4 (L) 3.5 - 5.1 mmol/L   Chloride 109 98 - 111 mmol/L   CO2 21 (L) 22 - 32 mmol/L   Glucose, Bld 89 70 - 99 mg/dL   BUN 6 6 - 20 mg/dL    Creatinine, Ser 2.84 0.44 - 1.00 mg/dL   Calcium 8.5 (L) 8.9 - 10.3 mg/dL   Total Protein 5.8 (L) 6.5 - 8.1 g/dL   Albumin 2.6 (L) 3.5 - 5.0 g/dL   AST 17 15 - 41 U/L   ALT 15 0 - 44 U/L   Alkaline Phosphatase 123 38 - 126 U/L   Total Bilirubin 0.5 0.3 - 1.2 mg/dL   GFR, Estimated >13 >24 mL/min   Anion gap 7 5 - 15  CBC with Differential/Platelet   Collection Time: 05/04/23  8:35 PM  Result Value Ref Range   WBC 8.3 4.0 - 10.5 K/uL   RBC 3.32 (L) 3.87 - 5.11 MIL/uL   Hemoglobin 11.0 (L) 12.0 - 15.0 g/dL   HCT 40.1 (L) 02.7 - 25.3 %   MCV 94.0 80.0 - 100.0 fL   MCH 33.1 26.0 - 34.0 pg   MCHC 35.3 30.0 - 36.0 g/dL   RDW 66.4 40.3 - 47.4 %   Platelets 235 150 - 400 K/uL   nRBC 0.0 0.0 - 0.2 %   Neutrophils Relative % 64 %   Neutro Abs 5.4 1.7 - 7.7 K/uL   Lymphocytes Relative 21 %   Lymphs Abs 1.7 0.7 - 4.0 K/uL   Monocytes Relative 13 %   Monocytes Absolute 1.0 0.1 - 1.0 K/uL   Eosinophils Relative 1 %   Eosinophils Absolute 0.1 0.0 - 0.5 K/uL   Basophils Relative 0 %   Basophils Absolute 0.0 0.0 - 0.1 K/uL   Immature Granulocytes 1 %   Abs Immature Granulocytes 0.04 0.00 - 0.07 K/uL     Constitutional: NAD, AAOx3  HE/ENT: extraocular movements grossly intact, moist mucous membranes CV: RRR PULM: nl respiratory effort, CTABL     Abd: gravid, non-tender, non-distended, soft      Ext: Non-tender, Nonedematous   Psych: mood appropriate, speech normal Pelvic: deferred  Pelvic exam: normal external genitalia, vulva, vagina, cervix, uterus and adnexa.  Fetal  monitoring: Cat 1 Appropriate for GA Baseline: 125bpm Variability: moderate Accelerations: present x >2 Decelerations absent  A/P: 27 y.o. [redacted]w[redacted]d here for antenatal surveillance for vaginal discharge  Principle Diagnosis:  Vaginal discharge in pregnancy  PPROM: not present, ROM plus negative Pre-eclampsia: not present. Labs WNL.  Headache: treated with Tylenol 1000mg  PO x 1 Labor: not present.  Fetal  Wellbeing: Reassuring Cat 1 tracing. Reactive NST  D/c home stable, precautions reviewed, follow-up as scheduled.    Janyce Llanos, CNM 05/04/2023 9:52 PM

## 2023-05-04 NOTE — Progress Notes (Signed)
Patient discharged home per order. After Visit Summary was printed and given to the patient. Discharge education completed with patient and support person including follow up instructions, appointments, and medication list. She received labor and bleeding precautions. Patient verbalized understanding and voiced no questions or concerns. Patient instructed to return to ED, call 911, or call provider for any changes in condition. Patient discharged home via personal vehicle with support person.

## 2023-05-04 NOTE — OB Triage Note (Signed)
Brenda Osborn 27 y.o. a G3P1011 @34wk6d  presents to Labor & Delivery triage via wheelchair steered by ED staff reporting leaking of fluid since 4pm. She denies active vaginal bleeding and reports +FM and occasional contractions. External FM and TOCO applied to non-tender abdomen. Initial FHR 125bpm. Vital signs obtained and within normal limits. Patient oriented to care environment including call bell and bed control use. Andrey Campanile, CNM notified of patient's arrival.

## 2023-05-05 LAB — OB RESULTS CONSOLE GC/CHLAMYDIA
Chlamydia: NEGATIVE
Neisseria Gonorrhea: NEGATIVE

## 2023-05-05 LAB — OB RESULTS CONSOLE GBS: GBS: NEGATIVE

## 2023-05-05 LAB — OB RESULTS CONSOLE HIV ANTIBODY (ROUTINE TESTING): HIV: NONREACTIVE

## 2023-05-05 LAB — OB RESULTS CONSOLE RPR: RPR: NONREACTIVE

## 2023-05-07 ENCOUNTER — Other Ambulatory Visit: Payer: Self-pay

## 2023-05-07 DIAGNOSIS — O99210 Obesity complicating pregnancy, unspecified trimester: Secondary | ICD-10-CM

## 2023-05-07 DIAGNOSIS — O10919 Unspecified pre-existing hypertension complicating pregnancy, unspecified trimester: Secondary | ICD-10-CM

## 2023-05-07 DIAGNOSIS — Q985 Karyotype 47, XYY: Secondary | ICD-10-CM

## 2023-05-07 DIAGNOSIS — F411 Generalized anxiety disorder: Secondary | ICD-10-CM

## 2023-05-13 ENCOUNTER — Other Ambulatory Visit: Payer: Self-pay

## 2023-05-13 ENCOUNTER — Observation Stay
Admission: EM | Admit: 2023-05-13 | Discharge: 2023-05-13 | Disposition: A | Discharge status: Home / Self Care | Payer: BC Managed Care – PPO | Attending: Obstetrics and Gynecology | Admitting: Obstetrics and Gynecology

## 2023-05-13 ENCOUNTER — Other Ambulatory Visit: Payer: Self-pay | Admitting: Obstetrics

## 2023-05-13 DIAGNOSIS — Z7982 Long term (current) use of aspirin: Secondary | ICD-10-CM | POA: Insufficient documentation

## 2023-05-13 DIAGNOSIS — O10913 Unspecified pre-existing hypertension complicating pregnancy, third trimester: Secondary | ICD-10-CM

## 2023-05-13 DIAGNOSIS — E669 Obesity, unspecified: Secondary | ICD-10-CM | POA: Diagnosis not present

## 2023-05-13 DIAGNOSIS — O99213 Obesity complicating pregnancy, third trimester: Secondary | ICD-10-CM | POA: Diagnosis not present

## 2023-05-13 DIAGNOSIS — Z3A36 36 weeks gestation of pregnancy: Secondary | ICD-10-CM | POA: Insufficient documentation

## 2023-05-13 DIAGNOSIS — O3663X0 Maternal care for excessive fetal growth, third trimester, not applicable or unspecified: Secondary | ICD-10-CM | POA: Insufficient documentation

## 2023-05-13 LAB — COMPREHENSIVE METABOLIC PANEL
ALT: 16 U/L (ref 0–44)
AST: 21 U/L (ref 15–41)
Albumin: 2.7 g/dL — ABNORMAL LOW (ref 3.5–5.0)
Alkaline Phosphatase: 125 U/L (ref 38–126)
Anion gap: 8 (ref 5–15)
BUN: 7 mg/dL (ref 6–20)
CO2: 21 mmol/L — ABNORMAL LOW (ref 22–32)
Calcium: 8.6 mg/dL — ABNORMAL LOW (ref 8.9–10.3)
Chloride: 107 mmol/L (ref 98–111)
Creatinine, Ser: 0.62 mg/dL (ref 0.44–1.00)
GFR, Estimated: >60 mL/min (ref >60)
Glucose, Bld: 78 mg/dL (ref 70–99)
Potassium: 3.9 mmol/L (ref 3.5–5.1)
Sodium: 136 mmol/L (ref 135–145)
Total Bilirubin: 0.8 mg/dL (ref 0.3–1.2)
Total Protein: 6.1 g/dL — ABNORMAL LOW (ref 6.5–8.1)

## 2023-05-13 LAB — CBC
HCT: 30.6 % — ABNORMAL LOW (ref 36.0–46.0)
Hemoglobin: 10.5 g/dL — ABNORMAL LOW (ref 12.0–15.0)
MCH: 32.4 pg (ref 26.0–34.0)
MCHC: 34.3 g/dL (ref 30.0–36.0)
MCV: 94.4 fL (ref 80.0–100.0)
Platelets: 246 10*3/uL (ref 150–400)
RBC: 3.24 MIL/uL — ABNORMAL LOW (ref 3.87–5.11)
RDW: 12.4 % (ref 11.5–15.5)
WBC: 8.4 10*3/uL (ref 4.0–10.5)
nRBC: 0 % (ref 0.0–0.2)

## 2023-05-13 LAB — PROTEIN / CREATININE RATIO, URINE
Creatinine, Urine: 64 mg/dL
Protein Creatinine Ratio: 0.16 mg/mg{Cre} — ABNORMAL HIGH (ref 0.00–0.15)
Total Protein, Urine: 10 mg/dL

## 2023-05-13 MED ORDER — HYDRALAZINE HCL 20 MG/ML IJ SOLN: 10.0000 mg | INTRAMUSCULAR | Status: DC | PRN

## 2023-05-13 MED ORDER — LABETALOL HCL 5 MG/ML IV SOLN: 40.0000 mg | INTRAVENOUS | Status: DC | PRN

## 2023-05-13 MED ORDER — LABETALOL HCL 5 MG/ML IV SOLN: 80.0000 mg | INTRAVENOUS | Status: DC | PRN

## 2023-05-13 MED ORDER — LABETALOL HCL 5 MG/ML IV SOLN: 20.0000 mg | INTRAVENOUS | Status: DC | PRN

## 2023-05-13 NOTE — OB Triage Note (Signed)
Pt Brenda Osborn 27 y.o. presents to labor and delivery triage reporting PIH evaluation. Pt is a G3P1011 at [redacted]w[redacted]d. Pt denies signs and symptoms consistent with rupture of membranes or active vaginal bleeding. Pt states positive fetal movement. External FM and TOCO applied to non-tender abdomen and assessing. Initial FHR 120. Vital signs obtained and within normal limits with the exception of BP which is elevated. Provider notified of pt.

## 2023-05-13 NOTE — Progress Notes (Signed)
Patient sent over from the office for [facility] workup with elevated BP 150's/100's  Chari Manning CNM

## 2023-05-13 NOTE — OB Triage Note (Signed)

## 2023-05-13 NOTE — Discharge Summary (Signed)
Brenda Osborn is a 27 y.o. female. She is at [redacted]w[redacted]d gestation. Patient's last menstrual period was 08/22/2022 (approximate). Estimated Date of Delivery: 06/09/23  Prenatal care site: [facility]  Chief complaint: elevated BP during office visit  HPI: Brenda presents to L&D with complaints of elevated bp  Factors complicating pregnancy: chronic HTN (no medications) Christella Osborn syndrome  obesity fetal macrosomia anxiety and depression migraines  S: Resting comfortably. no CTX, no VB.no LOF,  Active fetal movement.   Maternal Medical History:  Past Medical Hx:  has a past medical history of Choledocholithiasis (09/24/2021), Chronic migraine without aura, with intractable migraine, so stated, with status migrainosus, Hypertension, and Supervision of high risk pregnancy in first trimester (10/24/2022).    Past Surgical Hx:  has a past surgical history that includes No past surgeries and Gallbladder surgery (09/30/2021).   Allergies  Allergen Reactions   Zithromax [Azithromycin]     Respiratory Destress     Prior to Admission medications   Medication Sig Start Date End Date Taking? Authorizing Provider  acetaminophen (TYLENOL) 500 MG tablet Take 2 tablets (1,000 mg total) by mouth every 6 (six) hours as needed (for pain scale < 4  OR  temperature  >/=  100.5 F). 04/29/23   Gustavo Lah, CNM  aspirin 81 MG chewable tablet Chew 81 mg by mouth daily.    [provider]  magnesium oxide (MAG-OX) 400 (240 Mg) MG tablet Take 400 mg by mouth daily.    [provider]  Prenatal Vit-Fe Fumarate-FA (PRENATAL MULTIVITAMIN) TABS tablet Take 1 tablet by mouth daily at 12 noon.    [provider]    Social History: She  reports that she has never smoked. She has never used smokeless tobacco. She reports that she does not drink alcohol and does not use drugs.  Family History: family history includes Diabetes in her mother; Healthy in her sister and sister;  Hypertension in her maternal grandmother. ,no history of gyn cancers  Review of Systems: A full review of systems was performed and negative except as noted in the HPI.    O:  BP (!) 144/84   Pulse 72   LMP 08/22/2022 (Approximate)  Results for orders placed or performed during the hospital encounter of 05/13/23 (from the past 48 hour(s))  Comprehensive metabolic panel   Collection Time: 05/13/23  3:46 PM  Result Value Ref Range   Sodium 136 135 - 145 mmol/L   Potassium 3.9 3.5 - 5.1 mmol/L   Chloride 107 98 - 111 mmol/L   CO2 21 (L) 22 - 32 mmol/L   Glucose, Bld 78 70 - 99 mg/dL   BUN 7 6 - 20 mg/dL   Creatinine, Ser 6.96 0.44 - 1.00 mg/dL   Calcium 8.6 (L) 8.9 - 10.3 mg/dL   Total Protein 6.1 (L) 6.5 - 8.1 g/dL   Albumin 2.7 (L) 3.5 - 5.0 g/dL   AST 21 15 - 41 U/L   ALT 16 0 - 44 U/L   Alkaline Phosphatase 125 38 - 126 U/L   Total Bilirubin 0.8 0.3 - 1.2 mg/dL   GFR, Estimated >29 >52 mL/min   Anion gap 8 5 - 15  Protein / creatinine ratio, urine   Collection Time: 05/13/23  3:46 PM  Result Value Ref Range   Creatinine, Urine 64 mg/dL   Total Protein, Urine 10 mg/dL   Protein Creatinine Ratio 0.16 (H) 0.00 - 0.15 mg/mg[Cre]  CBC   Collection Time: 05/13/23  3:46  PM  Result Value Ref Range   WBC 8.4 4.0 - 10.5 K/uL   RBC 3.24 (L) 3.87 - 5.11 MIL/uL   Hemoglobin 10.5 (L) 12.0 - 15.0 g/dL   HCT 36.6 (L) 44.0 - 34.7 %   MCV 94.4 80.0 - 100.0 fL   MCH 32.4 26.0 - 34.0 pg   MCHC 34.3 30.0 - 36.0 g/dL   RDW 42.5 95.6 - 38.7 %   Platelets 246 150 - 400 K/uL   nRBC 0.0 0.0 - 0.2 %     Constitutional: NAD, AAOx3  HE/ENT: extraocular movements grossly intact, moist mucous membranes CV: RRR PULM: nl respiratory effort, CTABL Abd: gravid, non-tender, non-distended, soft  Ext: Non-tender, Nonedmeatous Psych: mood appropriate, speech normal Pelvic : deferred SVE:     Fetal Monitor: Baseline: 120 bpm Variability: moderate Accels: Present Decels: none Toco:  none  Category: I   Assessment: 27 y.o. [redacted]w[redacted]d here for antenatal surveillance during pregnancy.  Principle diagnosis:  The encounter diagnosis was Chronic hypertension with exacerbation during pregnancy in third trimester.   Plan: Labor: not present.  Fetal Wellbeing: Reassuring Cat 1 tracing. Reactive NST  PIH labs negative D/c home stable, precautions reviewed, follow-up as scheduled.   ----- Chari Manning, CNM Certified Nurse Midwife [place]  [facility]

## 2023-05-14 ENCOUNTER — Other Ambulatory Visit: Payer: Self-pay | Admitting: Maternal & Fetal Medicine

## 2023-05-14 ENCOUNTER — Other Ambulatory Visit: Payer: Self-pay

## 2023-05-14 ENCOUNTER — Ambulatory Visit: Payer: BC Managed Care – PPO | Attending: Maternal & Fetal Medicine

## 2023-05-14 DIAGNOSIS — Z3A36 36 weeks gestation of pregnancy: Secondary | ICD-10-CM | POA: Diagnosis not present

## 2023-05-14 DIAGNOSIS — O99210 Obesity complicating pregnancy, unspecified trimester: Secondary | ICD-10-CM

## 2023-05-14 DIAGNOSIS — O99213 Obesity complicating pregnancy, third trimester: Secondary | ICD-10-CM

## 2023-05-14 DIAGNOSIS — O10913 Unspecified pre-existing hypertension complicating pregnancy, third trimester: Secondary | ICD-10-CM | POA: Insufficient documentation

## 2023-05-14 DIAGNOSIS — E669 Obesity, unspecified: Secondary | ICD-10-CM

## 2023-05-14 DIAGNOSIS — O10919 Unspecified pre-existing hypertension complicating pregnancy, unspecified trimester: Secondary | ICD-10-CM

## 2023-05-14 DIAGNOSIS — Q985 Karyotype 47, XYY: Secondary | ICD-10-CM

## 2023-05-14 DIAGNOSIS — F411 Generalized anxiety disorder: Secondary | ICD-10-CM

## 2023-05-14 DIAGNOSIS — O3515X0 Maternal care for (suspected) chromosomal abnormality in fetus, sex chromosome abnormality, not applicable or unspecified: Secondary | ICD-10-CM

## 2023-05-14 DIAGNOSIS — O99343 Other mental disorders complicating pregnancy, third trimester: Secondary | ICD-10-CM | POA: Insufficient documentation

## 2023-05-14 DIAGNOSIS — O10013 Pre-existing essential hypertension complicating pregnancy, third trimester: Secondary | ICD-10-CM | POA: Diagnosis not present

## 2023-05-19 ENCOUNTER — Inpatient Hospital Stay: Payer: BC Managed Care – PPO | Admitting: Anesthesiology

## 2023-05-19 ENCOUNTER — Inpatient Hospital Stay
Admission: RE | Admit: 2023-05-19 | Discharge: 2023-05-23 | DRG: 806 | Disposition: A | Discharge status: Home / Self Care | Payer: BC Managed Care – PPO | Attending: Obstetrics | Admitting: Obstetrics

## 2023-05-19 ENCOUNTER — Other Ambulatory Visit: Payer: Self-pay

## 2023-05-19 ENCOUNTER — Encounter: Payer: Self-pay | Admitting: Obstetrics and Gynecology

## 2023-05-19 DIAGNOSIS — O1092 Unspecified pre-existing hypertension complicating childbirth: Principal | ICD-10-CM | POA: Diagnosis present

## 2023-05-19 DIAGNOSIS — D62 Acute posthemorrhagic anemia: Secondary | ICD-10-CM | POA: Diagnosis not present

## 2023-05-19 DIAGNOSIS — O9081 Anemia of the puerperium: Secondary | ICD-10-CM | POA: Diagnosis not present

## 2023-05-19 DIAGNOSIS — Z3A37 37 weeks gestation of pregnancy: Secondary | ICD-10-CM | POA: Diagnosis not present

## 2023-05-19 DIAGNOSIS — O99214 Obesity complicating childbirth: Secondary | ICD-10-CM | POA: Diagnosis present

## 2023-05-19 DIAGNOSIS — R21 Rash and other nonspecific skin eruption: Secondary | ICD-10-CM | POA: Diagnosis present

## 2023-05-19 DIAGNOSIS — O10919 Unspecified pre-existing hypertension complicating pregnancy, unspecified trimester: Secondary | ICD-10-CM | POA: Diagnosis present

## 2023-05-19 DIAGNOSIS — Z8249 Family history of ischemic heart disease and other diseases of the circulatory system: Secondary | ICD-10-CM

## 2023-05-19 DIAGNOSIS — Z7982 Long term (current) use of aspirin: Secondary | ICD-10-CM

## 2023-05-19 DIAGNOSIS — O9972 Diseases of the skin and subcutaneous tissue complicating childbirth: Secondary | ICD-10-CM | POA: Diagnosis present

## 2023-05-19 LAB — COMPREHENSIVE METABOLIC PANEL
ALT: 23 U/L (ref 0–44)
ALT: 22 U/L (ref 0–44)
AST: 32 U/L (ref 15–41)
AST: 31 U/L (ref 15–41)
Albumin: 2.7 g/dL — ABNORMAL LOW (ref 3.5–5.0)
Albumin: 2.8 g/dL — ABNORMAL LOW (ref 3.5–5.0)
Alkaline Phosphatase: 129 U/L — ABNORMAL HIGH (ref 38–126)
Alkaline Phosphatase: 128 U/L — ABNORMAL HIGH (ref 38–126)
Anion gap: 7 (ref 5–15)
Anion gap: 9 (ref 5–15)
BUN: 10 mg/dL (ref 6–20)
BUN: 9 mg/dL (ref 6–20)
CO2: 21 mmol/L — ABNORMAL LOW (ref 22–32)
CO2: 19 mmol/L — ABNORMAL LOW (ref 22–32)
Calcium: 8.2 mg/dL — ABNORMAL LOW (ref 8.9–10.3)
Calcium: 8.8 mg/dL — ABNORMAL LOW (ref 8.9–10.3)
Chloride: 108 mmol/L (ref 98–111)
Chloride: 109 mmol/L (ref 98–111)
Creatinine, Ser: 0.67 mg/dL (ref 0.44–1.00)
Creatinine, Ser: 0.69 mg/dL (ref 0.44–1.00)
GFR, Estimated: >60 mL/min (ref >60)
GFR, Estimated: >60 mL/min (ref >60)
Glucose, Bld: 81 mg/dL (ref 70–99)
Glucose, Bld: 101 mg/dL — ABNORMAL HIGH (ref 70–99)
Potassium: 3.6 mmol/L (ref 3.5–5.1)
Potassium: 3.6 mmol/L (ref 3.5–5.1)
Sodium: 136 mmol/L (ref 135–145)
Sodium: 137 mmol/L (ref 135–145)
Total Bilirubin: 0.6 mg/dL (ref 0.3–1.2)
Total Bilirubin: 0.6 mg/dL (ref 0.3–1.2)
Total Protein: 5.9 g/dL — ABNORMAL LOW (ref 6.5–8.1)
Total Protein: 6.1 g/dL — ABNORMAL LOW (ref 6.5–8.1)

## 2023-05-19 LAB — CBC WITH DIFFERENTIAL/PLATELET
Abs Immature Granulocytes: 0.07 10*3/uL (ref 0.00–0.07)
Basophils Absolute: 0 10*3/uL (ref 0.0–0.1)
Basophils Relative: 0 %
Eosinophils Absolute: 0 10*3/uL (ref 0.0–0.5)
Eosinophils Relative: 0 %
HCT: 31.9 % — ABNORMAL LOW (ref 36.0–46.0)
Hemoglobin: 11.4 g/dL — ABNORMAL LOW (ref 12.0–15.0)
Immature Granulocytes: 0 %
Lymphocytes Relative: 10 %
Lymphs Abs: 1.7 10*3/uL (ref 0.7–4.0)
MCH: 33.2 pg (ref 26.0–34.0)
MCHC: 35.7 g/dL (ref 30.0–36.0)
MCV: 93 fL (ref 80.0–100.0)
Monocytes Absolute: 1.5 10*3/uL — ABNORMAL HIGH (ref 0.1–1.0)
Monocytes Relative: 9 %
Neutro Abs: 13.3 10*3/uL — ABNORMAL HIGH (ref 1.7–7.7)
Neutrophils Relative %: 81 %
Platelets: 253 10*3/uL (ref 150–400)
RBC: 3.43 MIL/uL — ABNORMAL LOW (ref 3.87–5.11)
RDW: 12.2 % (ref 11.5–15.5)
WBC: 16.6 10*3/uL — ABNORMAL HIGH (ref 4.0–10.5)
nRBC: 0 % (ref 0.0–0.2)

## 2023-05-19 LAB — CBC
HCT: 31.1 % — ABNORMAL LOW (ref 36.0–46.0)
Hemoglobin: 10.9 g/dL — ABNORMAL LOW (ref 12.0–15.0)
MCH: 32.8 pg (ref 26.0–34.0)
MCHC: 35 g/dL (ref 30.0–36.0)
MCV: 93.7 fL (ref 80.0–100.0)
Platelets: 252 10*3/uL (ref 150–400)
RBC: 3.32 MIL/uL — ABNORMAL LOW (ref 3.87–5.11)
RDW: 12.5 % (ref 11.5–15.5)
WBC: 8.7 10*3/uL (ref 4.0–10.5)
nRBC: 0 % (ref 0.0–0.2)

## 2023-05-19 LAB — TYPE AND SCREEN
ABO/RH(D): A POS
Antibody Screen: NEGATIVE

## 2023-05-19 LAB — PROTEIN / CREATININE RATIO, URINE
Creatinine, Urine: 295 mg/dL
Protein Creatinine Ratio: 0.16 mg/mg{Cre} — ABNORMAL HIGH (ref 0.00–0.15)
Total Protein, Urine: 46 mg/dL

## 2023-05-19 MED ORDER — LACTATED RINGERS IV SOLN: 500.0000 mL | Freq: Once | INTRAVENOUS | Status: DC

## 2023-05-19 MED ORDER — OXYCODONE-ACETAMINOPHEN 5-325 MG PO TABS: 2.0000 | ORAL_TABLET | ORAL | Status: DC | PRN

## 2023-05-19 MED ORDER — LIDOCAINE HCL (PF) 1 % IJ SOLN
30.0000 mL | INTRAMUSCULAR | Status: DC | PRN
  Filled 2023-05-19: qty 30

## 2023-05-19 MED ORDER — COCONUT OIL OIL: 1.0000 | TOPICAL_OIL | Status: DC | PRN

## 2023-05-19 MED ORDER — OXYCODONE-ACETAMINOPHEN 5-325 MG PO TABS: 1.0000 | ORAL_TABLET | ORAL | Status: DC | PRN

## 2023-05-19 MED ORDER — PHENYLEPHRINE 80 MCG/ML (10ML) SYRINGE FOR IV PUSH (FOR BLOOD PRESSURE SUPPORT): 80.0000 ug | PREFILLED_SYRINGE | INTRAVENOUS | Status: DC | PRN

## 2023-05-19 MED ORDER — WITCH HAZEL-GLYCERIN EX PADS
1.0000 | MEDICATED_PAD | CUTANEOUS | Status: DC | PRN
  Filled 2023-05-19 (×2): qty 100

## 2023-05-19 MED ORDER — SOD CITRATE-CITRIC ACID 500-334 MG/5ML PO SOLN: 30.0000 mL | ORAL | Status: DC | PRN

## 2023-05-19 MED ORDER — EPHEDRINE 5 MG/ML INJ: 10.0000 mg | INTRAVENOUS | Status: DC | PRN

## 2023-05-19 MED ORDER — ONDANSETRON HCL 4 MG PO TABS: 4.0000 mg | ORAL_TABLET | ORAL | Status: DC | PRN

## 2023-05-19 MED ORDER — LABETALOL HCL 5 MG/ML IV SOLN: 40.0000 mg | INTRAVENOUS | Status: DC | PRN

## 2023-05-19 MED ORDER — OXYCODONE HCL 5 MG PO TABS: 10.0000 mg | ORAL_TABLET | ORAL | Status: DC | PRN

## 2023-05-19 MED ORDER — LABETALOL HCL 5 MG/ML IV SOLN
20.0000 mg | INTRAVENOUS | Status: DC | PRN
  Administered 2023-05-19: 20 mg via INTRAVENOUS
  Filled 2023-05-19: qty 4

## 2023-05-19 MED ORDER — HYDRALAZINE HCL 20 MG/ML IJ SOLN: 10.0000 mg | INTRAMUSCULAR | Status: DC | PRN

## 2023-05-19 MED ORDER — BENZOCAINE-MENTHOL 20-0.5 % EX AERO
1.0000 | INHALATION_SPRAY | CUTANEOUS | Status: DC | PRN
  Filled 2023-05-19: qty 56

## 2023-05-19 MED ORDER — DIPHENHYDRAMINE HCL 25 MG PO CAPS
25.0000 mg | ORAL_CAPSULE | Freq: Four times a day (QID) | ORAL | Status: DC | PRN
  Filled 2023-05-19: qty 1

## 2023-05-19 MED ORDER — OXYTOCIN 10 UNIT/ML IJ SOLN
INTRAMUSCULAR | Status: AC
  Filled 2023-05-19: qty 2

## 2023-05-19 MED ORDER — MISOPROSTOL 25 MCG QUARTER TABLET
25.0000 ug | ORAL_TABLET | ORAL | Status: DC
  Administered 2023-05-19 (×2): 25 ug via VAGINAL
  Filled 2023-05-19 (×2): qty 1

## 2023-05-19 MED ORDER — LABETALOL HCL 5 MG/ML IV SOLN
20.0000 mg | INTRAVENOUS | Status: DC | PRN
  Administered 2023-05-19: 20 mg via INTRAVENOUS
  Filled 2023-05-19 (×2): qty 4

## 2023-05-19 MED ORDER — ACETAMINOPHEN 325 MG PO TABS
650.0000 mg | ORAL_TABLET | ORAL | Status: DC | PRN
  Administered 2023-05-19 – 2023-05-23 (×5): 650 mg via ORAL
  Filled 2023-05-19 (×5): qty 2

## 2023-05-19 MED ORDER — FENTANYL-BUPIVACAINE-NACL 0.5-0.125-0.9 MG/250ML-% EP SOLN
EPIDURAL | Status: AC
  Filled 2023-05-19: qty 250

## 2023-05-19 MED ORDER — MAGNESIUM SULFATE BOLUS VIA INFUSION
4.0000 g | Freq: Once | INTRAVENOUS | Status: AC
  Administered 2023-05-19: 4 g via INTRAVENOUS
  Filled 2023-05-19: qty 1000

## 2023-05-19 MED ORDER — ONDANSETRON HCL 4 MG/2ML IJ SOLN: 4.0000 mg | INTRAMUSCULAR | Status: DC | PRN

## 2023-05-19 MED ORDER — IBUPROFEN 600 MG PO TABS
ORAL_TABLET | ORAL | Status: AC
  Administered 2023-05-21: 600 mg via ORAL
  Filled 2023-05-19: qty 1

## 2023-05-19 MED ORDER — DIPHENHYDRAMINE HCL 50 MG/ML IJ SOLN: 12.5000 mg | INTRAMUSCULAR | Status: DC | PRN

## 2023-05-19 MED ORDER — LABETALOL HCL 5 MG/ML IV SOLN
40.0000 mg | INTRAVENOUS | Status: DC | PRN
  Administered 2023-05-19: 40 mg via INTRAVENOUS
  Filled 2023-05-19: qty 8

## 2023-05-19 MED ORDER — LIDOCAINE-EPINEPHRINE (PF) 1.5 %-1:200000 IJ SOLN
INTRAMUSCULAR | Status: DC | PRN
  Administered 2023-05-19: 3 mL via PERINEURAL

## 2023-05-19 MED ORDER — FENTANYL-BUPIVACAINE-NACL 0.5-0.125-0.9 MG/250ML-% EP SOLN: 12.0000 mL/h | EPIDURAL | Status: DC | PRN

## 2023-05-19 MED ORDER — OXYTOCIN-SODIUM CHLORIDE 30-0.9 UT/500ML-% IV SOLN: 1.0000 m[IU]/min | INTRAVENOUS | Status: DC

## 2023-05-19 MED ORDER — MISOPROSTOL 25 MCG QUARTER TABLET
25.0000 ug | ORAL_TABLET | ORAL | Status: DC
  Administered 2023-05-19: 25 ug via ORAL
  Filled 2023-05-19 (×3): qty 1

## 2023-05-19 MED ORDER — LABETALOL HCL 200 MG PO TABS
200.0000 mg | ORAL_TABLET | Freq: Two times a day (BID) | ORAL | Status: DC
  Administered 2023-05-19 – 2023-05-21 (×4): 200 mg via ORAL
  Filled 2023-05-19: qty 1
  Filled 2023-05-19: qty 2
  Filled 2023-05-19: qty 1

## 2023-05-19 MED ORDER — LACTATED RINGERS IV SOLN: INTRAVENOUS | Status: DC

## 2023-05-19 MED ORDER — LABETALOL HCL 5 MG/ML IV SOLN: 80.0000 mg | INTRAVENOUS | Status: DC | PRN

## 2023-05-19 MED ORDER — TETANUS-DIPHTH-ACELL PERTUSSIS 5-2.5-18.5 LF-MCG/0.5 IM SUSY: 0.5000 mL | PREFILLED_SYRINGE | Freq: Once | INTRAMUSCULAR | Status: DC

## 2023-05-19 MED ORDER — PRENATAL MULTIVITAMIN CH
1.0000 | ORAL_TABLET | Freq: Every day | ORAL | Status: DC
  Administered 2023-05-21 – 2023-05-23 (×3): 1 via ORAL
  Filled 2023-05-19 (×3): qty 1

## 2023-05-19 MED ORDER — IBUPROFEN 600 MG PO TABS
600.0000 mg | ORAL_TABLET | Freq: Four times a day (QID) | ORAL | Status: DC
  Administered 2023-05-19 – 2023-05-21 (×6): 600 mg via ORAL
  Filled 2023-05-19 (×6): qty 1

## 2023-05-19 MED ORDER — ONDANSETRON HCL 4 MG/2ML IJ SOLN: 4.0000 mg | Freq: Four times a day (QID) | INTRAMUSCULAR | Status: DC | PRN

## 2023-05-19 MED ORDER — OXYTOCIN-SODIUM CHLORIDE 30-0.9 UT/500ML-% IV SOLN
2.5000 [IU]/h | INTRAVENOUS | Status: DC
  Administered 2023-05-19: 2.5 [IU]/h via INTRAVENOUS
  Filled 2023-05-19: qty 500

## 2023-05-19 MED ORDER — ACETAMINOPHEN 325 MG PO TABS
ORAL_TABLET | ORAL | Status: AC
  Filled 2023-05-19: qty 2

## 2023-05-19 MED ORDER — OXYCODONE HCL 5 MG PO TABS: 5.0000 mg | ORAL_TABLET | ORAL | Status: DC | PRN

## 2023-05-19 MED ORDER — TERBUTALINE SULFATE 1 MG/ML IJ SOLN: 0.2500 mg | Freq: Once | INTRAMUSCULAR | Status: DC | PRN

## 2023-05-19 MED ORDER — LIDOCAINE HCL (PF) 1 % IJ SOLN
INTRAMUSCULAR | Status: DC | PRN
  Administered 2023-05-19: 3 mL

## 2023-05-19 MED ORDER — VARICELLA VIRUS VACCINE LIVE 1350 PFU/0.5ML IJ SUSR
0.5000 mL | INTRAMUSCULAR | Status: DC | PRN
  Filled 2023-05-19: qty 0.5

## 2023-05-19 MED ORDER — BUPIVACAINE HCL (PF) 0.25 % IJ SOLN
INTRAMUSCULAR | Status: DC | PRN
  Administered 2023-05-19: 5 mL via EPIDURAL

## 2023-05-19 MED ORDER — OXYTOCIN BOLUS FROM INFUSION
333.0000 mL | Freq: Once | INTRAVENOUS | Status: AC
  Administered 2023-05-19: 333 mL via INTRAVENOUS

## 2023-05-19 MED ORDER — FERROUS SULFATE 325 (65 FE) MG PO TABS
325.0000 mg | ORAL_TABLET | Freq: Two times a day (BID) | ORAL | Status: DC
  Administered 2023-05-20 – 2023-05-23 (×7): 325 mg via ORAL
  Filled 2023-05-19 (×7): qty 1

## 2023-05-19 MED ORDER — LACTATED RINGERS IV SOLN: 500.0000 mL | INTRAVENOUS | Status: DC | PRN

## 2023-05-19 MED ORDER — SENNOSIDES-DOCUSATE SODIUM 8.6-50 MG PO TABS
2.0000 | ORAL_TABLET | Freq: Every day | ORAL | Status: DC
  Administered 2023-05-20 – 2023-05-23 (×3): 2 via ORAL
  Filled 2023-05-19 (×5): qty 2

## 2023-05-19 MED ORDER — ACETAMINOPHEN 325 MG PO TABS: 650.0000 mg | ORAL_TABLET | ORAL | Status: DC | PRN

## 2023-05-19 MED ORDER — AMMONIA AROMATIC IN INHA
RESPIRATORY_TRACT | Status: AC
  Filled 2023-05-19: qty 10

## 2023-05-19 MED ORDER — LABETALOL HCL 100 MG PO TABS
ORAL_TABLET | ORAL | Status: AC
  Filled 2023-05-19: qty 2

## 2023-05-19 MED ORDER — MISOPROSTOL 200 MCG PO TABS
ORAL_TABLET | ORAL | Status: AC
  Administered 2023-05-19: 25 ug via ORAL
  Filled 2023-05-19: qty 4

## 2023-05-19 MED ORDER — SIMETHICONE 80 MG PO CHEW: 80.0000 mg | CHEWABLE_TABLET | ORAL | Status: DC | PRN

## 2023-05-19 MED ORDER — FENTANYL CITRATE (PF) 100 MCG/2ML IJ SOLN: 50.0000 ug | INTRAMUSCULAR | Status: DC | PRN

## 2023-05-19 MED ORDER — MAGNESIUM SULFATE 40 GM/1000ML IV SOLN
2.0000 g/h | INTRAVENOUS | Status: DC
  Administered 2023-05-19: 2 g/h via INTRAVENOUS
  Filled 2023-05-19: qty 1000

## 2023-05-19 MED ORDER — DIBUCAINE (PERIANAL) 1 % EX OINT: 1.0000 | TOPICAL_OINTMENT | CUTANEOUS | Status: DC | PRN

## 2023-05-19 NOTE — Anesthesia Procedure Notes (Signed)
Epidural Patient location during procedure: OB Start time: 05/19/2023 6:55 PM End time: 05/19/2023 7:10 PM  Staffing Anesthesiologist: Louie Boston, MD Resident/CRNA: Johney Maine D, CRNA Performed: resident/CRNA   Preanesthetic Checklist Completed: patient identified, IV checked, site marked, risks and benefits discussed, surgical consent, monitors and equipment checked, pre-op evaluation and timeout performed  Epidural Patient position: sitting Prep: ChloraPrep Patient monitoring: heart rate, continuous pulse ox and blood pressure Approach: midline Location: L3-L4 Injection technique: LOR saline  Needle:  Needle type: Tuohy  Needle gauge: 17 G Needle length: 9 cm and 9 Needle insertion depth: 7 cm Catheter type: closed end flexible Catheter size: 19 Gauge Catheter at skin depth: 12 cm Test dose: negative and 1.5% lidocaine with Epi 1:200 K  Assessment Sensory level: T10 Events: blood not aspirated, no cerebrospinal fluid, injection not painful, no injection resistance, no paresthesia and negative IV test  Additional Notes 1 attempt Pt. Evaluated and documentation done after procedure finished. Patient identified. Risks/Benefits/Options discussed with patient including but not limited to bleeding, infection, nerve damage, paralysis, failed block, incomplete pain control, headache, blood pressure changes, nausea, vomiting, reactions to medication both or allergic, itching and postpartum back pain. Confirmed with bedside nurse the patient's most recent platelet count. Confirmed with patient that they are not currently taking any anticoagulation, have any bleeding history or any family history of bleeding disorders. Patient expressed understanding and wished to proceed. All questions were answered. Sterile technique was used throughout the entire procedure. Please see nursing notes for vital signs. Test dose was given through epidural catheter and negative prior to continuing to  dose epidural or start infusion. Warning signs of high block given to the patient including shortness of breath, tingling/numbness in hands, complete motor block, or any concerning symptoms with instructions to call for help. Patient was given instructions on fall risk and not to get out of bed. All questions and concerns addressed with instructions to call with any issues or inadequate analgesia.    Patient tolerated the insertion well without immediate complications.Reason for block:procedure for pain

## 2023-05-19 NOTE — H&P (Signed)
OB History & Physical   History of Present Illness:  Chief Complaint:   HPI:  Brenda Osborn is a 27 y.o. G44P1011 female at [redacted]w[redacted]d dated by 7 week u/s.  She presents to L&D for IOL for [facility].  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 27 y.o. G3P1011 at  Patient's last menstrual period was 08/22/2022 (exact date). inconsistent with  with ultrasound on 10/24/22 @ [redacted]w[redacted]d .Estimated Date of Delivery:06/09/23 Sex of baby and name:  " "   Partner:    Josh Factors complicating this pregnancy Chronic HTN (PO meds) Pre-pregnancy medications: Procardia 30 MG Daily Home BP monitor: check    Past Surgical History:  Procedure Laterality Date   GALLBLADDER SURGERY  09/30/2021    Allergies  Allergen Reactions   Zithromax [Azithromycin]     Respiratory Destress    Prior to Admission medications   Medication Sig Start Date End Date Taking? Authorizing Provider  acetaminophen (TYLENOL) 500 MG tablet Take 2 tablets (1,000 mg total) by mouth every 6 (six) hours as needed (for pain scale < 4  OR  temperature  >/=  100.5 F). 04/29/23   Gustavo Lah, CNM  aspirin 81 MG chewable tablet Chew 81 mg by mouth daily.    [provider]  labetalol (NORMODYNE) 100 MG tablet Take 200 mg by mouth 2 (two) times daily. MFM changed dose on 05/14/23 to 200 bid    [provider]   magnesium oxide (MAG-OX) 400 (240 Mg) MG tablet Take 400 mg by mouth daily.    [provider]  Prenatal Vit-Fe Fumarate-FA (PRENATAL MULTIVITAMIN) TABS tablet Take 1 tablet by mouth daily at 12 noon.    [provider]     Prenatal care site: [facility]   Social History: She  reports that she has never smoked. She has never used smokeless tobacco. She reports that she does not drink alcohol and does not use drugs.  Family History: family history includes Diabetes in her mother; Healthy in her sister and sister; Hypertension in her maternal grandmother.   Review of Systems: A full review of systems was performed and negative except as noted in the HPI.    Physical Exam:  Vital Signs: BP (!) 138/92 (BP Location: Left Arm)   Pulse 93   Temp 98.1 F (36.7 C) (Oral)   Resp 16   Ht 5\' 8"  (1.727 m)   Wt 117 kg Comment: 258lbs  LMP 08/22/2022 (Approximate)   BMI 39.23 kg/m   General:   alert and cooperative  Skin:  normal  Neurologic:    Alert & oriented x 3  Lungs:    Nl effort  Heart:   regular rate and rhythm  Abdomen:  soft, non-tender; bowel sounds normal; no masses,  no organomegaly  Extremities: : non-tender, symmetric,  no edema bilaterally.      EFW: 05/14/23 EFW =  76%  Pertinent Results:  Prenatal Labs: Blood type/Rh A pos  Antibody screen neg  Rubella Immune  Varicella Non-Immune  RPR NR  HBsAg Neg  HIV NR  GC neg  Chlamydia neg  Genetic screening Abnormal   1 hour GTT 126  3 hour GTT   GBS Negative   FHT: FHR: 135 bpm, variability: moderate,  accelerations:  Present,  decelerations:  Absent Category/reactivity:  Category I TOCO: none SVE: Dilation: 2 / Effacement (%): 50 / Station: -3       [place] MFM OB FOLLOW UP  Result Date: 05/14/2023 ----------------------------------------------------------------------  OBSTETRICS REPORT                       (Signed Final 05/14/2023 10:54 am)  ---------------------------------------------------------------------- Patient Info  ID #:       [id number]                          D.O.B.:  08-22-1996 (27 yrs)  Name:       Brenda Osborn                Visit Date: 05/14/2023 09:20 am ---------------------------------------------------------------------- Performed By  Attending:        Braxton Feathers DO       Ref. Address:     [facility]  Performed By:     Eden Lathe BS      [facility]         [facility]                    RDMS RVT                                 Fetal Care at                                                             [facility]  Referred By:      Tomasita Morrow CNM ---------------------------------------------------------------------- Orders  #  Description                           Code        Ordered By  1  [place] MFM OB FOLLOW UP                   40102.72    BURK SCHAIBLE  2  [place] MFM FETAL BPP WO NON               76819.01    [facility]     STRESS ----------------------------------------------------------------------  #  Order #                     Accession #                Episode #  1  [id number]                   [id number]  [id number]  2  [id number]                   [id number]                 [id number] ---------------------------------------------------------------------- Indications  Abnormal finding on antenatal screening        O28.9  (NIPS - HR for XYY)  Obesity complicating pregnancy, third          O99.213  trimester (pregravid BMI 37)  Hypertension - Chronic/Pre-existing (no        O10.019  meds)  Encounter for other antenatal screening        Z36.2  follow-up  [redacted] weeks gestation of pregnancy                Z3A.36 ---------------------------------------------------------------------- Fetal Evaluation  Num Of Fetuses:         1  Fetal Heart Rate(bpm):  121  Cardiac Activity:       Observed  Presentation:           Cephalic  Placenta:               Posterior  P. Cord  Insertion:      Previously seen  Amniotic Fluid  AFI FV:      Within normal limits  AFI Sum(cm)     %Tile       Largest Pocket(cm)  10.48           26          4.12  RUQ(cm)       RLQ(cm)       LUQ(cm)        LLQ(cm)  2.76          2.88          0.72           4.12 ---------------------------------------------------------------------- Biophysical Evaluation  Amniotic F.V:   Within normal limits       F. Tone:        Observed  F. Movement:    Observed                   Score:          8/8  F. Breathing:   Observed ---------------------------------------------------------------------- Biometry  BPD:     89.89  mm     G. Age:  36w 3d         64  %    CI:        78.46   %    70 - 86                                                          FL/HC:      22.7   %    20.1 - 22.1  HC:    320.99   mm     G. Age:  36w 2d         20  %    HC/AC:      0.95        0.93 - 1.11  AC:    336.14   mm     G. Age:  37w 4d         89  %    FL/BPD:  80.9   %    71 - 87  FL:      72.72  mm     G. Age:  37w 2d         71  %    FL/AC:      21.6   %    20 - 24  Est. FW:    3136  gm    6 lb 15 oz      76  % ---------------------------------------------------------------------- Gestational Age  LMP:           37w 6d        Date:  08/22/22                 EDD:   05/29/23  U/S Today:     36w 6d                                        EDD:   06/05/23  Best:          36w 2d     Det. ByMarcella Dubs         EDD:   06/09/23                                      (10/24/22) ---------------------------------------------------------------------- Anatomy  Cranium:               Appears normal         Aortic Arch:            Previously seen  Cavum:                 Appears normal         Ductal Arch:            Previously seen  Ventricles:            Previously seen        Diaphragm:              Previously seen  Choroid Plexus:        Previously seen        Stomach:                Appears normal, left                                                                         sided  Cerebellum:            Previously seen        Abdomen:                Appears normal  Posterior Fossa:       Previously seen        Abdominal Wall:         Previously seen  Nuchal Fold:           Not applicable (>20    Cord Vessels:           Previously seen  wks GA)  Face:                  Appears normal         Kidneys:                Appear normal                         (orbits and profile)  Lips:                  Previously seen        Bladder:                Appears normal  Thoracic:              Previously seen        Spine:                  Previously seen  Heart:                 Appears normal         Upper Extremities:      Previously seen                         (4CH, axis, and                         situs)  RVOT:                  Appears normal         Lower Extremities:      Previously seen  LVOT:                  Appears normal  Other:  VC, 3VV and 3VTV prev visualized. Nasal bone, lenses, maxilla,          mandible and falx prev visualized. Heels/feet prev visualized. Female          gender previously seen. Tech difficult due to AGA. ---------------------------------------------------------------------- Cervix Uterus Adnexa  Cervix  Not visualized (advanced GA >24wks)  Uterus  No abnormality visualized.  Right Ovary  Not visualized.  Left Ovary  Not visualized.  Cul De Sac  No free fluid seen.  Adnexa  No abnormality visualized ---------------------------------------------------------------------- Comments  The patient is here for a follow-up BPP and growth ultrasound  at 36w 2d for [facility]. She was d/c yesterday after BP  observation. She denies heaches, vision changes or RUQ  pain. She was told to increase her Labetalol but a dose was  not given per her report. She will increased to 200 mg BID  and then 400 mg BID if her BP is still not at goal in 24 hours.  EDD: 06/09/2023 dated by Early Ultrasound  (10/24/22).  Sonographic findings  Single intrauterine  pregnancy.  Fetal cardiac activity: Observed.  Presentation: Cephalic.  Interval fetal anatomy appears normal.  Fetal biometry shows the estimated fetal weight at the 76  percentile.  Amniotic fluid volume: Within normal limits. AFI: 10.48 cm.  MVP: 4.12 cm.  Placenta: Posterior.  BPP: 8/8.  Recommendations  - Delivery is scheduled for one week from now.  I gave her  preeclampsia, fetal movement and labor precautions.  She  verbalized understanding and will increase her labetalol as  specified or go to the hospital if she has any concerns. ----------------------------------------------------------------------  Braxton Feathers, DO Electronically Signed Final Report   05/14/2023 10:54 am ----------------------------------------------------------------------     Assessment:  Brenda Osborn is a 27 y.o. G43P1011 female at [redacted]w[redacted]d with CHTN.   Plan:  1. Admit to Labor & Delivery; consents reviewed and obtained  2. Fetal Well being  - Fetal Tracing: Cat I - GBS neg - Presentation: vtx confirmed by sve   3. Routine OB: - Prenatal labs reviewed, as above - Rh pos - CBC & T&S on admit - Clear fluids, IVF  4. Induction of Labor -  Contractions byexternal toco in place -  Pelvis proven to 2660g -  Plan for induction with cytotc -  Plan for continuous fetal monitoring  -  Maternal pain control as desired: IVPM, nitrous, regional anesthesia - Anticipate vaginal delivery  5. Post Partum Planning: - Infant feeding: breastfeeding - Contraception: Mirena IUD   Haroldine Laws, CNM 05/19/2023 10:39 AM

## 2023-05-19 NOTE — Discharge Summary (Shared)
Obstetrical Discharge Summary  Patient Name: Brenda Osborn DOB: 1996/08/14 MRN: [id number]  Date of Admission: 05/19/2023 Date of Delivery: 05/19/23 Delivered by: Brenda Osborn, CNM  Date of Discharge: 05/21/2023  [facility]: [facility] [id number] last menstrual period was 08/22/2022 (approximate). 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  Admitting Diagnosis: Chronic hypertension affecting pregnancy [O10.919]  Secondary Diagnosis: Patient Active Problem List   Diagnosis Date Noted   NSVD (normal spontaneous vaginal delivery) 05/19/2023   Chronic hypertension with exacerbation during pregnancy in third trimester 05/13/2023   Vaginal discharge during pregnancy 05/04/2023   Fall at home, initial encounter 04/29/2023   Supervision of high risk pregnancy in second trimester 10/24/2022   B12 deficiency 12/18/2021   Vitamin D deficiency 12/18/2021   Chronic hypertension affecting pregnancy 05/26/2020   Type A blood, Rh positive 05/12/2020   GAD (generalized anxiety disorder) 11/04/2019   Hidradenitis suppurativa 10/03/2017    Discharge Diagnosis: Term Pregnancy Delivered and CHTN      Induction: Cytotec Complications: None Intrapartum complications/course: see delivery note Delivery Type: spontaneous vaginal delivery Anesthesia: epidural anesthesia Placenta: spontaneous To Pathology: No  Laceration: none Episiotomy: none Newborn Data: Live born female  Birth Weight:  3130 g APGAR: 8, 8   Newborn Delivery   Birth date/time: 05/19/2023 19:12:00 Delivery type: Vaginal, Spontaneous     Postpartum Procedures: none Edinburgh:     05/21/2023    4:09 AM 01/08/2021    2:20 PM 12/14/2020    3:21  PM 12/05/2020    9:33 AM 11/28/2020    3:09 PM  Edinburgh Postnatal Depression Scale Screening Tool  I have been able to laugh and see the funny side of things. 0 0 0 0 0  I have looked forward with enjoyment to things. 0 0 0 0 0  I have blamed myself unnecessarily when things went wrong. 2 3 2 3 2   I have been anxious or worried for no good reason. 2 3 2 3 3   I have felt scared or panicky for no good reason. 1 3 1 3 2   Things have been getting on top of me. 1 2 1 2 2   I have been so unhappy that I have had difficulty sleeping. 0 1 0 1 0  I have felt sad or miserable. 0 1 0 1 0  I have been so unhappy that I have been crying. 0 1 1 1  0  The thought of harming myself has occurred to me. 0 0 0 0 0  Edinburgh Postnatal Depression Scale Total 6 14 7 14 9      Post partum course:  Patient had an uncomplicated postpartum course.  By time of discharge on PPD#2, her pain was controlled on oral pain medications; she had appropriate lochia and was ambulating, voiding without difficulty and tolerating regular diet.  She was deemed stable for discharge to home.    Discharge Physical Exam:  BP (!) 137/102 (BP Location: Left Arm) Comment: nurse Brenda Osborn notified  Pulse 73   Temp 98.3 F (36.8 C) (Oral)   Resp 20   Ht 5\' 8"  (1.727 m)   Wt 117 kg Comment: 258lbs  LMP 08/22/2022 (Approximate)   SpO2 99%   Breastfeeding Unknown   BMI 39.23 kg/m  General: NAD CV: RRR Pulm: CTABL, nl effort ABD: s/nd/nt, fundus firm and below the umbilicus Lochia: moderate Perineum:minimal edema/intact DVT Evaluation: LE non-ttp, no evidence of DVT on exam.  Hemoglobin  Date Value Ref Range Status  05/21/2023 11.0 (L) 12.0 - 15.0 g/dL Final  40/98/1191 47.8 11.1 - 15.9 g/dL Final   HCT  Date Value Ref Range Status  05/21/2023 32.5 (L) 36.0 - 46.0 % Final   Hematocrit  Date Value Ref Range Status  12/05/2020 39.1 34.0 - 46.6 % Final    Risk assessment for postpartum VTE and prophylactic treatment: Very  high risk factors: None High risk factors: None Moderate risk factors: BMI 30-40 kg/m2  Postpartum VTE prophylaxis with LMWH not indicated  Disposition: stable, discharge to home. Baby Feeding: breast feeding Baby Disposition: home with mom  Rh Immune globulin indicated: No Rubella vaccine given: was not indicated Varivax vaccine given: was given Flu vaccine given in AP setting: n/a Tdap vaccine given in AP setting: Yes   Contraception: IUD  Prenatal Labs:  Blood type/Rh A pos  Antibody screen neg  Rubella Immune  Varicella Non-Immune  RPR NR  HBsAg Neg  HIV NR  GC neg  Chlamydia neg  Genetic screening Abnormal   1 hour GTT 126  3 hour GTT    GBS Negative    Plan:  Brenda Osborn was discharged to home in good condition.   Discharge Medications: Allergies as of 05/21/2023       Reactions   Zithromax [azithromycin]    Respiratory Destress        Medication List     STOP taking these medications    aspirin 81 MG chewable tablet   magnesium oxide 400 (240 Mg) MG tablet Commonly known as: MAG-OX   prenatal multivitamin Tabs tablet       TAKE these medications    acetaminophen 325 MG tablet Commonly known as: Tylenol Take 2 tablets (650 mg total) by mouth every 4 (four) hours as needed (for pain scale < 4). What changed:  medication strength how much to take when to take this reasons to take this   benzocaine-Menthol 20-0.5 % Aero Commonly known as: DERMOPLAST Apply 1 Application topically as needed for irritation (perineal discomfort).   coconut oil Oil Apply 1 Application topically as needed.   dibucaine 1 % Oint Commonly known as: NUPERCAINAL Place 1 Application rectally as needed for hemorrhoids.   ferrous sulfate 325 (65 FE) MG tablet Take 1 tablet (325 mg total) by mouth 2 (two) times daily with a meal.   furosemide 20 MG tablet Commonly known as: LASIX Take 1 tablet (20 mg total) by mouth daily for 4 doses. Start taking on:  May 22, 2023   ibuprofen 600 MG tablet Commonly known as: ADVIL Take 1 tablet (600 mg total) by mouth every 6 (six) hours.   labetalol 300 MG tablet Commonly known as: NORMODYNE Take 1 tablet (300 mg total) by mouth 2 (two) times daily. What changed:  medication strength how much to take additional instructions   loratadine 10 MG tablet Commonly known as: CLARITIN Take 1 tablet (10 mg total) by mouth at bedtime.   predniSONE 10 MG tablet Commonly known as: DELTASONE Take 3.5 tablets (35 mg total) by mouth daily with breakfast for 1 day, THEN 3 tablets (30 mg total) daily with breakfast for 1 day, THEN 2.5 tablets (25 mg total) daily with breakfast for 1 day, THEN 2 tablets (20 mg total) daily with breakfast  for 1 day, THEN 1.5 tablets (15 mg total) daily with breakfast for 1 day, THEN 1 tablet (10 mg total) daily with breakfast for 1 day. Start taking on: May 22, 2023   senna-docusate 8.6-50 MG tablet Commonly known as: Senokot-S Take 2 tablets by mouth daily. Start taking on: May 22, 2023   simethicone 80 MG chewable tablet Commonly known as: MYLICON Chew 1 tablet (80 mg total) by mouth as needed for flatulence.   witch hazel-glycerin pad Commonly known as: TUCKS Apply 1 Application topically as needed for hemorrhoids.         Follow-up Information     Brenda Osborn, CNM. Schedule an appointment as soon as possible for a visit in 6 week(s).   Specialty: Certified Nurse Midwife Why: pp  visit and Mirena IUD Contact information: [address] [place] [address] [phone]         Brenda Osborn, CNM Follow up in 2 week(s).   Specialty: Certified Nurse Midwife Why: mood check Contact information: [address] [place] [address] [phone]         [facility] Follow up in 2 day(s).   Why: blood pressure check Contact information: [address] [place] [address] [phone]                 Signed: Chari Manning CNM

## 2023-05-19 NOTE — Anesthesia Preprocedure Evaluation (Addendum)
Anesthesia Evaluation  Patient identified by MRN, date of birth, ID band Patient awake    Reviewed: Allergy & Precautions, H&P , NPO status , Patient's Chart, lab work & pertinent test results  Airway Mallampati: II  TM Distance: >3 FB Neck ROM: full    Dental  (+) Teeth Intact   Pulmonary neg pulmonary ROS   Pulmonary exam normal breath sounds clear to auscultation       Cardiovascular Exercise Tolerance: Good hypertension, On Medications and Pt. on medications Normal cardiovascular exam Rhythm:regular Rate:Normal  Controlled w/PO Labetalol   Neuro/Psych  Headaches PSYCHIATRIC DISORDERS Anxiety        GI/Hepatic negative GI ROS, Neg liver ROS,,,  Endo/Other  negative endocrine ROS    Renal/GU negative Renal ROS  negative genitourinary   Musculoskeletal   Abdominal   Peds  Hematology negative hematology ROS (+)   Anesthesia Other Findings   Reproductive/Obstetrics (+) Pregnancy                             Anesthesia Physical Anesthesia Plan  ASA: 2  Anesthesia Plan: Epidural   Post-op Pain Management:    Induction:   PONV Risk Score and Plan:   Airway Management Planned:   Additional Equipment:   Intra-op Plan:   Post-operative Plan:   Informed Consent: I have reviewed the patients History and Physical, chart, labs and discussed the procedure including the risks, benefits and alternatives for the proposed anesthesia with the patient or authorized representative who has indicated his/her understanding and acceptance.       Plan Discussed with: Anesthesiologist and CRNA  Anesthesia Plan Comments:        Anesthesia Quick Evaluation

## 2023-05-19 NOTE — Progress Notes (Signed)
Labor Progress Note  Brenda Osborn is a 27 y.o. G3P1011 at [redacted]w[redacted]d by ultrasound admitted for induction of labor due to Hypertension.  Subjective: Pt is in significant pain. She is requesting an epidural  Objective: BP (!) 159/100   Pulse 71   Temp 98.1 F (36.7 C) (Oral)   Resp 16   Ht 5\' 8"  (1.727 m)   Wt 117 kg Comment: 258lbs  LMP 08/22/2022 (Approximate)   BMI 39.23 kg/m   Fetal Assessment: FHT:  FHR: 120 bpm, variability: moderate,  accelerations:  Present,  decelerations:  Absent Category/reactivity:  Category I UC:   regular, every 2-5 minutes SVE:    Dilation: 4cm  Effacement: 80%  Station:  -2  Consistency: soft  Position: anterior  Membrane status: Intact Amniotic color: n/a  Labs: Lab Results  Component Value Date   WBC 8.7 05/19/2023   HGB 10.9 (L) 05/19/2023   HCT 31.1 (L) 05/19/2023   MCV 93.7 05/19/2023   PLT 252 05/19/2023    Assessment / Plan: Induction of labor due to [facility] 1017 2/50/-3 1017 Cytotec PO and PV 1425 2/50/-3 1429 Cytotec PO and PV 1830 4/80/-2  Labor: Induction progressing normally  Preeclampsia:  no signs or symptoms of toxicity and labs stable 1600 200mg  Labetalol - scheduled daily dose 1729 162/107 1748 169/103 1755 20mg  Labetalol rescue dose 1818 159/100  Fetal Wellbeing:  Category I Pain Control:  Labor support without medications, requesting epidural I/D:   Afebrile, GBS neg, Intact Anticipated MOD:  NSVD  Cyril Mourning, CNM 05/19/2023, 6:33 PM

## 2023-05-19 NOTE — Progress Notes (Signed)
   05/19/23 1000  Spiritual Encounters  Type of Visit Initial  Care provided to: Pt and family  Conversation partners present during encounter Nurse  Referral source Patient request  Reason for visit Advance directives  OnCall Visit No  Spiritual Framework  Patient Stress Factors Family relationships (Patient wants only mother to have Health Care Power of Attorney)  Interventions  Spiritual Care Interventions Made Established relationship of care and support;Compassionate presence;Reflective listening;Encouragement;Other (comment) (Document for advance directive discussed and provided for review with her mother)   Chaplain spiritual support services available as the need arises.

## 2023-05-20 LAB — CBC
HCT: 31.7 % — ABNORMAL LOW (ref 36.0–46.0)
Hemoglobin: 11.1 g/dL — ABNORMAL LOW (ref 12.0–15.0)
MCH: 33.1 pg (ref 26.0–34.0)
MCHC: 35 g/dL (ref 30.0–36.0)
MCV: 94.6 fL (ref 80.0–100.0)
Platelets: 235 10*3/uL (ref 150–400)
RBC: 3.35 MIL/uL — ABNORMAL LOW (ref 3.87–5.11)
RDW: 12.4 % (ref 11.5–15.5)
WBC: 11.8 10*3/uL — ABNORMAL HIGH (ref 4.0–10.5)
nRBC: 0.2 % (ref 0.0–0.2)

## 2023-05-20 LAB — RPR: RPR Ser Ql: NONREACTIVE

## 2023-05-20 MED ORDER — DIPHENHYDRAMINE HCL 25 MG PO CAPS
25.0000 mg | ORAL_CAPSULE | Freq: Four times a day (QID) | ORAL | Status: DC | PRN
  Administered 2023-05-20 – 2023-05-21 (×3): 25 mg via ORAL
  Filled 2023-05-20 (×2): qty 1

## 2023-05-20 MED ORDER — HYDROCORTISONE 1 % EX LOTN
TOPICAL_LOTION | Freq: Three times a day (TID) | CUTANEOUS | Status: DC
  Filled 2023-05-20: qty 118

## 2023-05-20 MED ORDER — CALCIUM GLUCONATE 10 % IV SOLN
INTRAVENOUS | Status: AC
  Filled 2023-05-20: qty 10

## 2023-05-20 MED ORDER — FUROSEMIDE 20 MG PO TABS
20.0000 mg | ORAL_TABLET | Freq: Every day | ORAL | Status: DC
  Administered 2023-05-21 – 2023-05-23 (×3): 20 mg via ORAL
  Filled 2023-05-20 (×3): qty 1

## 2023-05-20 MED ORDER — LORATADINE 10 MG PO TABS
10.0000 mg | ORAL_TABLET | Freq: Every day | ORAL | Status: DC
  Administered 2023-05-20 – 2023-05-22 (×3): 10 mg via ORAL
  Filled 2023-05-20 (×3): qty 1

## 2023-05-20 MED ORDER — FAMOTIDINE 20 MG PO TABS
10.0000 mg | ORAL_TABLET | Freq: Two times a day (BID) | ORAL | Status: DC
  Administered 2023-05-20 – 2023-05-22 (×4): 10 mg via ORAL
  Filled 2023-05-20 (×4): qty 1

## 2023-05-20 NOTE — Anesthesia Postprocedure Evaluation (Signed)
Anesthesia Post Note  Patient: Brenda Osborn  Procedure(s) Performed: AN AD HOC LABOR EPIDURAL  Patient location during evaluation: L&D Anesthesia Type: Epidural Level of consciousness: awake and alert and oriented Pain management: pain level controlled Respiratory status: respiratory function stable Cardiovascular status: stable Postop Assessment: no headache, no backache, patient able to bend at knees, no apparent nausea or vomiting, able to ambulate and adequate PO intake Anesthetic complications: no   No notable events documented.   Last Vitals:  Vitals:   05/20/23 0734 05/20/23 0735  BP: 136/83   Pulse: 77   Resp: 16   Temp:    SpO2:  96%    Last Pain:  Vitals:   05/20/23 0734  TempSrc:   PainSc: 0-No pain                 [name]

## 2023-05-20 NOTE — Progress Notes (Signed)
Postpartum Day  1  Subjective: 27 y.o. [id number] postpartum day #1 status post normal spontaneous vaginal delivery. She is ambulating, is tolerating po, is voiding spontaneously.  Her pain is well controlled on PO pain medications. Her lochia is less than menses.  Objective: BP (!) 145/92   Pulse 80   Temp 98.4 F (36.9 C) (Oral)   Resp 16   Ht 5\' 8"  (1.727 m)   Wt 117 kg Comment: 258lbs  LMP 08/22/2022 (Approximate)   SpO2 99%   Breastfeeding Unknown   BMI 39.23 kg/m    Vitals:   05/19/23 2333 05/20/23 0005 05/20/23 0035 05/20/23 0115  BP: (!) 146/85 138/88 136/86 136/80   05/20/23 0135 05/20/23 0235 05/20/23 0335 05/20/23 0434  BP: (!) 145/87 (!) 142/95 (!) 140/93 136/87   05/20/23 0535 05/20/23 0734 05/20/23 0834 05/20/23 0934  BP: 137/88 136/83 128/82 (!) 145/92    Physical Exam:  General: alert, cooperative, and no distress Breasts: soft/nontender Pulm: nl effort Abdomen: soft, non-tender, active bowel sounds Uterine Fundus: firm Perineum: minimal edema, intact Lochia: appropriate DVT Evaluation: No evidence of DVT seen on physical exam.  Recent Labs    05/19/23 2227 05/20/23 0638  HGB 11.4* 11.1*  HCT 31.9* 31.7*  WBC 16.6* 11.8*  PLT 253 235    Intake/Output Summary (Last 24 hours) at 05/20/2023 1002 Last data filed at 05/20/2023 0930 Gross per 24 hour  Intake 1483.76 ml  Output 2070 ml  Net -586.24 ml     Assessment/Plan: 27 y.o. [id number] postpartum day # 1  1. Continue routine postpartum care  2. Chronic hypertension with superimposed preeclampsia -mag sulfate currently infusing  -BP's normal to mild range -Labetalol 200 mg PO ordered  -Asymptomatic -No severe range BP's requiring treatment since 2135 -Urine output adequate  -Will continue mag sulfate for 12-24 hours postpartum   3. Contraception plan: IUD  4. CBC reviewed  -Hemodynamically stable and asymptomatic -Intervention: no intervention  -Will repeat CBC with PreE labs in  AM  5. Immunization status:   needs Varicella prior to discharge   Disposition: continue inpatient postpartum care    LOS: 1 day   Gustavo Lah, CNM 05/20/2023, 9:59 AM   ----- Margaretmary Eddy  Certified Nurse Midwife [facility]

## 2023-05-21 LAB — CBC
HCT: 32.5 % — ABNORMAL LOW (ref 36.0–46.0)
Hemoglobin: 11 g/dL — ABNORMAL LOW (ref 12.0–15.0)
MCH: 32.7 pg (ref 26.0–34.0)
MCHC: 33.8 g/dL (ref 30.0–36.0)
MCV: 96.7 fL (ref 80.0–100.0)
Platelets: 242 10*3/uL (ref 150–400)
RBC: 3.36 MIL/uL — ABNORMAL LOW (ref 3.87–5.11)
RDW: 12.7 % (ref 11.5–15.5)
WBC: 9.4 10*3/uL (ref 4.0–10.5)
nRBC: 0 % (ref 0.0–0.2)

## 2023-05-21 LAB — HEPATIC FUNCTION PANEL
ALT: 18 U/L (ref 0–44)
AST: 22 U/L (ref 15–41)
Albumin: 2.6 g/dL — ABNORMAL LOW (ref 3.5–5.0)
Alkaline Phosphatase: 119 U/L (ref 38–126)
Bilirubin, Direct: <0.1 mg/dL (ref 0.0–0.2)
Total Bilirubin: 0.6 mg/dL (ref 0.3–1.2)
Total Protein: 5.9 g/dL — ABNORMAL LOW (ref 6.5–8.1)

## 2023-05-21 LAB — CREATININE, SERUM
Creatinine, Ser: 0.84 mg/dL (ref 0.44–1.00)
GFR, Estimated: >60 mL/min (ref >60)

## 2023-05-21 MED ORDER — LABETALOL HCL 200 MG PO TABS: 400.0000 mg | ORAL_TABLET | Freq: Two times a day (BID) | ORAL | 0 refills | Status: DC

## 2023-05-21 MED ORDER — COCONUT OIL OIL: 1.0000 | TOPICAL_OIL | Status: AC | PRN

## 2023-05-21 MED ORDER — IBUPROFEN 600 MG PO TABS: 600.0000 mg | ORAL_TABLET | Freq: Four times a day (QID) | ORAL | 0 refills | Status: AC

## 2023-05-21 MED ORDER — WITCH HAZEL-GLYCERIN EX PADS: 1.0000 | MEDICATED_PAD | CUTANEOUS | Status: AC | PRN

## 2023-05-21 MED ORDER — DIPHENHYDRAMINE HCL 25 MG PO CAPS
25.0000 mg | ORAL_CAPSULE | Freq: Once | ORAL | Status: AC
  Administered 2023-05-21: 25 mg via ORAL

## 2023-05-21 MED ORDER — DIBUCAINE (PERIANAL) 1 % EX OINT: 1.0000 | TOPICAL_OINTMENT | CUTANEOUS | Status: AC | PRN

## 2023-05-21 MED ORDER — PREDNISONE 5 MG PO TABS
35.0000 mg | ORAL_TABLET | Freq: Every day | ORAL | Status: AC
  Administered 2023-05-22: 35 mg via ORAL
  Filled 2023-05-21: qty 1

## 2023-05-21 MED ORDER — ACETAMINOPHEN 325 MG PO TABS: 650.0000 mg | ORAL_TABLET | ORAL | Status: AC | PRN

## 2023-05-21 MED ORDER — PREDNISONE 10 MG PO TABS: ORAL_TABLET | ORAL | 0 refills | Status: AC

## 2023-05-21 MED ORDER — PREDNISONE 10 MG PO TABS
30.0000 mg | ORAL_TABLET | Freq: Every day | ORAL | Status: AC
  Administered 2023-05-23: 30 mg via ORAL
  Filled 2023-05-21: qty 3

## 2023-05-21 MED ORDER — LABETALOL HCL 200 MG PO TABS
400.0000 mg | ORAL_TABLET | Freq: Two times a day (BID) | ORAL | Status: DC
  Administered 2023-05-21 – 2023-05-22 (×2): 400 mg via ORAL
  Filled 2023-05-21 (×2): qty 2

## 2023-05-21 MED ORDER — BENZOCAINE-MENTHOL 20-0.5 % EX AERO: 1.0000 | INHALATION_SPRAY | CUTANEOUS | Status: AC | PRN

## 2023-05-21 MED ORDER — FUROSEMIDE 20 MG PO TABS: 20.0000 mg | ORAL_TABLET | Freq: Every day | ORAL | 0 refills | Status: AC

## 2023-05-21 MED ORDER — LORATADINE 10 MG PO TABS: 10.0000 mg | ORAL_TABLET | Freq: Every day | ORAL | 0 refills | Status: AC

## 2023-05-21 MED ORDER — PREDNISONE 10 MG PO TABS: 20.0000 mg | ORAL_TABLET | Freq: Every day | ORAL | Status: DC

## 2023-05-21 MED ORDER — DIPHENHYDRAMINE HCL 50 MG PO CAPS: 50.0000 mg | ORAL_CAPSULE | Freq: Three times a day (TID) | ORAL | 0 refills | Status: AC | PRN

## 2023-05-21 MED ORDER — SENNOSIDES-DOCUSATE SODIUM 8.6-50 MG PO TABS: 2.0000 | ORAL_TABLET | Freq: Every day | ORAL | Status: AC

## 2023-05-21 MED ORDER — LABETALOL HCL 300 MG PO TABS: 300.0000 mg | ORAL_TABLET | Freq: Two times a day (BID) | ORAL | 2 refills | Status: DC

## 2023-05-21 MED ORDER — PREDNISONE 5 MG PO TABS: 15.0000 mg | ORAL_TABLET | Freq: Every day | ORAL | Status: DC

## 2023-05-21 MED ORDER — FERROUS SULFATE 325 (65 FE) MG PO TABS: 325.0000 mg | ORAL_TABLET | Freq: Two times a day (BID) | ORAL | Status: AC

## 2023-05-21 MED ORDER — IBUPROFEN 600 MG PO TABS
600.0000 mg | ORAL_TABLET | Freq: Four times a day (QID) | ORAL | Status: DC
  Administered 2023-05-22 (×3): 600 mg via ORAL
  Filled 2023-05-21 (×3): qty 1

## 2023-05-21 MED ORDER — PREDNISONE 10 MG PO TABS
40.0000 mg | ORAL_TABLET | Freq: Every day | ORAL | Status: AC
  Administered 2023-05-21: 40 mg via ORAL
  Filled 2023-05-21: qty 4

## 2023-05-21 MED ORDER — PREDNISONE 5 MG PO TABS: 25.0000 mg | ORAL_TABLET | Freq: Every day | ORAL | Status: DC

## 2023-05-21 MED ORDER — SIMETHICONE 80 MG PO CHEW: 80.0000 mg | CHEWABLE_TABLET | ORAL | Status: AC | PRN

## 2023-05-21 MED ORDER — DIPHENHYDRAMINE HCL 25 MG PO CAPS
50.0000 mg | ORAL_CAPSULE | Freq: Three times a day (TID) | ORAL | Status: DC | PRN
  Administered 2023-05-21 – 2023-05-22 (×4): 50 mg via ORAL
  Filled 2023-05-21 (×4): qty 2

## 2023-05-21 MED ORDER — LABETALOL HCL 100 MG PO TABS
100.0000 mg | ORAL_TABLET | Freq: Once | ORAL | Status: AC
  Administered 2023-05-21: 100 mg via ORAL
  Filled 2023-05-21: qty 1

## 2023-05-21 MED ORDER — PREDNISONE 10 MG PO TABS: 10.0000 mg | ORAL_TABLET | Freq: Every day | ORAL | Status: DC

## 2023-05-21 MED ORDER — LABETALOL HCL 200 MG PO TABS: 300.0000 mg | ORAL_TABLET | Freq: Two times a day (BID) | ORAL | Status: DC

## 2023-05-21 NOTE — Progress Notes (Signed)
Postpartum Day  2  Subjective: 27 y.o. [id number] postpartum day # 2 status post normal spontaneous vaginal delivery. She is ambulating, is tolerating po, is voiding spontaneously.  Her pain is well controlled on PO pain medications. Her lochia is less than menses.  Objective: BP (!) 149/100 (BP Location: Left Arm) Comment: nurse Jasmine notified  Pulse 88   Temp 98.4 F (36.9 C) (Oral)   Resp 20   Ht 5\' 8"  (1.727 m)   Wt 117 kg Comment: 258lbs  LMP 08/22/2022 (Approximate)   SpO2 99%   Breastfeeding Unknown   BMI 39.23 kg/m    Physical Exam:  General: alert, cooperative, and appears stated age Breasts: soft/nontender Pulm: nl effort Abdomen: soft, non-tender, active bowel sounds Uterine Fundus: firm Perineum: minimal edema, intact Lochia: appropriate DVT Evaluation: No evidence of DVT seen on physical exam. Negative Homan's sign. No cords or calf tenderness. No significant calf/ankle edema.  Recent Labs    05/20/23 0638 05/21/23 0644  HGB 11.1* 11.0*  HCT 31.7* 32.5*  WBC 11.8* 9.4  PLT 235 242    Assessment/Plan: 27 y.o. [id number] postpartum day # 2  1. Continue routine postpartum care  2. Infant feeding status: formula feeding --Encouraged snug fitting bra, cold application, Tylenol PRN, and cabbage leaves for engorgement for formula feeding   3. Contraception plan: IUD  4. Acute blood loss anemia - clinically significant.  --Hemodynamically stable and asymptomatic --Intervention: start on oral supplementation with ferrous sulfate 325 mg   5. Immunization status:   all immunizations up to date  6. BP remains elevated increased Labetalol from 200 BID to 400 mg BID.  7. Prednisone taper for diffuse body rash  Disposition: continue inpatient postpartum care , plan for discharge home tomorrow    LOS: 2 days   Maat Kafer Wonda Amis, CNM 05/21/2023, 7:26 PM   ----- Chari Manning Certified Nurse Midwife [facility]

## 2023-05-22 LAB — COMPREHENSIVE METABOLIC PANEL
ALT: 19 U/L (ref 0–44)
AST: 23 U/L (ref 15–41)
Albumin: 3 g/dL — ABNORMAL LOW (ref 3.5–5.0)
Alkaline Phosphatase: 125 U/L (ref 38–126)
Anion gap: 11 (ref 5–15)
BUN: 12 mg/dL (ref 6–20)
CO2: 20 mmol/L — ABNORMAL LOW (ref 22–32)
Calcium: 8.6 mg/dL — ABNORMAL LOW (ref 8.9–10.3)
Chloride: 109 mmol/L (ref 98–111)
Creatinine, Ser: 0.85 mg/dL (ref 0.44–1.00)
GFR, Estimated: >60 mL/min (ref >60)
Glucose, Bld: 141 mg/dL — ABNORMAL HIGH (ref 70–99)
Potassium: 3.8 mmol/L (ref 3.5–5.1)
Sodium: 140 mmol/L (ref 135–145)
Total Bilirubin: 0.6 mg/dL (ref 0.3–1.2)
Total Protein: 6.7 g/dL (ref 6.5–8.1)

## 2023-05-22 LAB — CBC
HCT: 33.2 % — ABNORMAL LOW (ref 36.0–46.0)
Hemoglobin: 11.2 g/dL — ABNORMAL LOW (ref 12.0–15.0)
MCH: 32.9 pg (ref 26.0–34.0)
MCHC: 33.7 g/dL (ref 30.0–36.0)
MCV: 97.6 fL (ref 80.0–100.0)
Platelets: 270 10*3/uL (ref 150–400)
RBC: 3.4 MIL/uL — ABNORMAL LOW (ref 3.87–5.11)
RDW: 12.7 % (ref 11.5–15.5)
WBC: 10.2 10*3/uL (ref 4.0–10.5)
nRBC: 0 % (ref 0.0–0.2)

## 2023-05-22 LAB — PROTEIN / CREATININE RATIO, URINE
Creatinine, Urine: 105 mg/dL
Protein Creatinine Ratio: 0.1 mg/mg{Cre} (ref 0.00–0.15)
Total Protein, Urine: 10 mg/dL

## 2023-05-22 MED ORDER — NIFEDIPINE ER OSMOTIC RELEASE 30 MG PO TB24
30.0000 mg | ORAL_TABLET | Freq: Every day | ORAL | Status: DC
  Administered 2023-05-22: 30 mg via ORAL
  Filled 2023-05-22: qty 1

## 2023-05-22 MED ORDER — LABETALOL HCL 200 MG PO TABS
300.0000 mg | ORAL_TABLET | Freq: Two times a day (BID) | ORAL | Status: DC
  Administered 2023-05-22: 300 mg via ORAL
  Filled 2023-05-22: qty 1

## 2023-05-22 NOTE — Progress Notes (Signed)
Postpartum Day  3  Subjective: 27 y.o. [id number] postpartum day # 2 status post normal spontaneous vaginal delivery. She is ambulating, is tolerating po, is voiding spontaneously.  Her pain is well controlled on PO pain medications. Her lochia is less than menses. Her body rash reportedly better per patient with urticaria after starting Prednisone taper, the majority of rash has improved, but has now became prominent in axilla. She now has swelling in lips, had some swelling in tongue, but now resolved. She reports a numb and tingling feeling around mouth. She denies any SOB, difficulty swallowing or breathing.  Objective: BP (!) 136/100 (BP Location: Left Arm)   Pulse 87   Temp 98.2 F (36.8 C) (Oral)   Resp 16   Ht 5\' 8"  (1.727 m)   Wt 117 kg Comment: 258lbs  LMP 08/22/2022 (Approximate)   SpO2 98%   Breastfeeding Unknown   BMI 39.23 kg/m    Physical Exam:  General: alert, cooperative, and appears stated age Breasts: soft/nontender Pulm: nl effort Abdomen: soft, non-tender, active bowel sounds Uterine Fundus: firm Perineum: minimal edema, intact Lochia: appropriate DVT Evaluation: No evidence of DVT seen on physical exam. Negative Homan's sign. No cords or calf tenderness. No significant calf/ankle edema.  Recent Labs    05/20/23 0638 05/21/23 0644  HGB 11.1* 11.0*  HCT 31.7* 32.5*  WBC 11.8* 9.4  PLT 235 242    Assessment/Plan: 27 y.o. F6O1308 postpartum day # 3  1. Continue routine postpartum care  2. Infant feeding status: formula feeding --Encouraged snug fitting bra, cold application, Tylenol PRN, and cabbage leaves for engorgement for formula feeding   3. Contraception plan: IUD  4. Acute blood loss anemia - clinically significant.  --Hemodynamically stable and asymptomatic --Intervention: start on oral supplementation with ferrous sulfate 325 mg   5. Immunization status:   all immunizations up to date  6. BP remains elevated increased Labetalol from  200 BID to 400 mg BID.D/C'd labetalol for a trial of Procardia xl 30 mg daily  7. Prednisone taper for diffuse body rash.  8. Consult to hospitalist placed for rash of unknown origin.    Disposition: continue inpatient postpartum care   Dr Dalbert Garnet aware and agrees with plan   LOS: 3 days   Seymore Brodowski Wonda Amis, CNM 05/22/2023, 12:18 PM   ----- Chari Manning Certified Nurse Midwife [facility]

## 2023-05-22 NOTE — Progress Notes (Signed)
Restarted Labetalol, 300 mg BID with Procardia for elevated BP. PIH labs redrawn, CBC and CMP essentially normal in the postpartum period. PCR pending. Pharmacology consult placed for medication review for interactions for rash and lip swelling.  Brenda Osborn CNM

## 2023-05-22 NOTE — Progress Notes (Signed)
Patient transported to room 340 accompanied by family and baby in bassinet. No belongings left in old patient room.

## 2023-05-22 NOTE — Discharge Instructions (Signed)
Discharge Instructions:   Follow-up Appointment:  If there are any new medications, they have been ordered and will be available for pickup at the listed pharmacy on your way home from the hospital.   Call office if you have any of the following: headache, visual changes, fever >101.0 F, chills, shortness of breath, breast concerns, excessive vaginal bleeding, incision drainage or problems, leg pain or redness, depression or any other concerns. If you have vaginal discharge with an odor, let your doctor know.   It is normal to bleed for up to 6 weeks. You should not soak through more than 1 pad in 1 hour. If you have a blood clot larger than your fist with continued bleeding, call your doctor.   Activity: Do not lift > 10 lbs for 6 weeks (do not lift anything heavier than your baby). No intercourse, tampons, swimming pools, hot tubs, baths (only showers) for 6 weeks.  No driving for 1-2 weeks. Continue prenatal vitamin, especially if breastfeeding. Increase calories and fluids (water) while breastfeeding.   Your milk will come in, in the next couple of days (right now it is colostrum). You may have a slight fever when your milk comes in, but it should go away on its own.  If it does not, and rises above 101 F please call the doctor. You will also feel achy and your breasts will be firm. They will also start to leak. If you are breastfeeding, continue as you have been and you can pump/express milk for comfort.   If you have too much milk, your breasts can become engorged, which could lead to mastitis. This is an infection of the milk ducts. It can be very painful and you will need to notify your doctor to obtain a prescription for antibiotics. You can also treat it with a shower or hot/cold compress.   For concerns about your baby, please call your pediatrician.  For breastfeeding concerns, the lactation consultant can be reached at [phone].   Postpartum blues (feelings of happy one minute  and sad another minute) are normal for the first few weeks but if it gets worse let your doctor know.   Congratulations! We enjoyed caring for you and your new bundle of joy!  

## 2023-05-23 ENCOUNTER — Telehealth: Payer: Self-pay

## 2023-05-23 MED ORDER — NIFEDIPINE ER OSMOTIC RELEASE 30 MG PO TB24
90.0000 mg | ORAL_TABLET | Freq: Every day | ORAL | Status: DC
  Administered 2023-05-23: 90 mg via ORAL
  Filled 2023-05-23: qty 3

## 2023-05-23 MED ORDER — LABETALOL HCL 200 MG PO TABS: 400.0000 mg | ORAL_TABLET | Freq: Two times a day (BID) | ORAL | Status: DC

## 2023-05-23 MED ORDER — LABETALOL HCL 200 MG PO TABS: 800.0000 mg | ORAL_TABLET | Freq: Two times a day (BID) | ORAL | Status: DC

## 2023-05-23 MED ORDER — NIFEDIPINE 10 MG PO CAPS
10.0000 mg | ORAL_CAPSULE | ORAL | Status: DC | PRN
  Administered 2023-05-23: 10 mg via ORAL
  Filled 2023-05-23: qty 1

## 2023-05-23 MED ORDER — NIFEDIPINE 10 MG PO CAPS: 10.0000 mg | ORAL_CAPSULE | Freq: Once | ORAL | Status: DC | PRN

## 2023-05-23 MED ORDER — NIFEDIPINE 10 MG PO CAPS
20.0000 mg | ORAL_CAPSULE | ORAL | Status: DC | PRN
  Filled 2023-05-23: qty 2

## 2023-05-23 MED ORDER — PROPRANOLOL HCL 20 MG PO TABS
20.0000 mg | ORAL_TABLET | Freq: Every day | ORAL | Status: DC
  Administered 2023-05-23: 20 mg via ORAL
  Filled 2023-05-23: qty 1

## 2023-05-23 MED ORDER — LABETALOL HCL 5 MG/ML IV SOLN: 40.0000 mg | INTRAVENOUS | Status: DC | PRN

## 2023-05-23 MED ORDER — PROPRANOLOL HCL 20 MG PO TABS: 20.0000 mg | ORAL_TABLET | Freq: Every day | ORAL | 2 refills | Status: AC

## 2023-05-23 MED ORDER — NIFEDIPINE ER OSMOTIC RELEASE 90 MG PO TB24: 90.0000 mg | ORAL_TABLET | Freq: Every day | ORAL | 1 refills | Status: AC

## 2023-05-23 NOTE — Progress Notes (Signed)
Patient discharged. Discharge instructions given. Patient verbalizes understanding. Transported by axillary. 

## 2023-05-23 NOTE — Discharge Summary (Signed)
Postpartum Discharge Summary  Patient Name: Brenda Osborn DOB: 13-Jan-1996 MRN: [id number]  Date of admission: 05/19/2023 Delivery date:05/19/2023 Delivering provider: Haroldine Laws Date of discharge: 05/23/2023  Primary OB: [facility] [id number] last menstrual period was 08/22/2022 (approximate). 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  Admitting diagnosis: Chronic hypertension affecting pregnancy [O10.919] Intrauterine pregnancy: [redacted]w[redacted]d     Secondary diagnosis:  Principal Problem:   NSVD (normal spontaneous vaginal delivery) Active Problems:   Chronic hypertension affecting pregnancy  Additional problems: severe-range blood pressures   Discharge Diagnosis: Term Pregnancy Delivered and CHTN                                                Post partum procedures: IV magnesium x 24hrs, prolonged postpartum hospitalization d/t severe-range blood pressures Augmentation: Cytotec Complications: None Delivery Type: spontaneous vaginal delivery Anesthesia: epidural anesthesia Placenta: spontaneous To Pathology: No  Laceration: n/a Episiotomy: none  Prenatal Labs:  Blood type/Rh A pos  Antibody screen neg  Rubella Immune  Varicella Non-Immune  RPR NR  HBsAg Neg  HIV NR  GC neg  Chlamydia neg  Genetic screening Abnormal   1 hour GTT 126  3 hour GTT    GBS Negative    Postpartum Procedures:  IV magnesium, rescue BP medications Edinburgh:     05/21/2023    4:09 AM 01/08/2021    2:20 PM 12/14/2020    3:21 PM 12/05/2020    9:33 AM 11/28/2020    3:09 PM  Edinburgh Postnatal Depression Scale Screening Tool  I have been able to laugh and see the funny side of things. 0 0 0 0 0  I have looked  forward with enjoyment to things. 0 0 0 0 0  I have blamed myself unnecessarily when things went wrong. 2 3 2 3 2   I have been anxious or worried for no good reason. 2 3 2 3 3   I have felt scared or panicky for no good reason. 1 3 1 3 2   Things have been getting on top of me. 1 2 1 2 2   I have been so unhappy that I have had difficulty sleeping. 0 1 0 1 0  I have felt sad or miserable. 0 1 0 1 0  I have been so unhappy that I have been crying. 0 1 1 1  0  The thought of harming myself has occurred to me. 0 0 0 0 0  Edinburgh Postnatal Depression Scale Total 6 14 7 14 9      Hospital course: Induction of Labor With Vaginal Delivery   27 y.o. yo [id number] at [redacted]w[redacted]d was admitted to the hospital 05/19/2023 for induction of labor.  Indication for induction:  chronic HTN .  Patient had an uncomplicated labor course.  Membrane Rupture Time/Date:  ,05/19/2023  Delivery Method:Vaginal, Spontaneous Operative Delivery:N/A Episiotomy: None Lacerations:  None Details of delivery can be found in separate delivery note.  After delivery she was noted to have severe range blood pressures and was started on IV magnesium for 24hrs. After receiving IV magnesium she was started on Labetalol 200mg  BID. She also started  Lasix to help with swelling. She developed a rash and was started on Prednisone taper. Her Labetalol was increased up to 800mg  BID but she was reporting rash and lip swelling which she believed was from the Labetalol. The Labetalol was d/c and she was placed on Procardia 90mg  XL. Her blood pressure was increasing again but staying below severe range. On PPD 4, she spoke with Dr. Jean Rosenthal and she was recommended to stay in the hospital while blood pressure dosage being managed, but she is adament to go home. She was discharged home with Procardia 90mg  XL and Propranolol 20-60mg  daily. Reviewed strict BP precautions and warning sign. BP check on 05/26/23.Marland Kitchen Patient is discharged home 05/23/23.  Newborn  Data: Birth date:05/19/2023 Birth time:7:12 PM Gender:Female Living status:Living Apgars:8 ,8  Weight:3130 g  Magnesium Sulfate received: Yes: Seizure prophylaxis BMZ received: No Rhophylac:No MMR:No Varivax vaccine given: offered T-DaP:Given prenatally Flu: No  Transfusion:No  Physical exam  Vitals:   05/23/23 0630 05/23/23 0805 05/23/23 1000 05/23/23 1220  BP: 126/75 127/80 (!) 144/98 (!) 153/104  Pulse: 99 86 88 99  Resp:  17  17  Temp:  98.8 F (37.1 C)  97.9 F (36.6 C)  TempSrc:  Oral  Oral  SpO2:  99%  99%  Weight:      Height:       General: alert, cooperative, and no distress Lochia: appropriate Uterine Fundus: firm Perineum: minimal edema/intact DVT Evaluation: No evidence of DVT seen on physical exam.  Labs: Lab Results  Component Value Date   WBC 10.2 05/22/2023   HGB 11.2 (L) 05/22/2023   HCT 33.2 (L) 05/22/2023   MCV 97.6 05/22/2023   PLT 270 05/22/2023      Latest Ref Rng & Units 05/22/2023    3:08 PM  CMP  Glucose 70 - 99 mg/dL 829   BUN 6 - 20 mg/dL 12   Creatinine 5.62 - 1.00 mg/dL 1.30   Sodium 865 - 784 mmol/L 140   Potassium 3.5 - 5.1 mmol/L 3.8   Chloride 98 - 111 mmol/L 109   CO2 22 - 32 mmol/L 20   Calcium 8.9 - 10.3 mg/dL 8.6   Total Protein 6.5 - 8.1 g/dL 6.7   Total Bilirubin 0.3 - 1.2 mg/dL 0.6   Alkaline Phos 38 - 126 U/L 125   AST 15 - 41 U/L 23   ALT 0 - 44 U/L 19    Edinburgh Score:    05/21/2023    4:09 AM  Edinburgh Postnatal Depression Scale Screening Tool  I have been able to laugh and see the funny side of things. 0  I have looked forward with enjoyment to things. 0  I have blamed myself unnecessarily when things went wrong. 2  I have been anxious or worried for no good reason. 2  I have felt scared or panicky for no good reason. 1  Things have been getting on top of me. 1  I have been so unhappy that I have had difficulty sleeping. 0  I have felt sad or miserable. 0  I have been so unhappy that I have been  crying. 0  The thought of harming myself has occurred to me. 0  Edinburgh Postnatal Depression Scale Total 6    Risk assessment for postpartum VTE and prophylactic treatment: Very high risk factors: None High risk factors: None Moderate risk factors: BMI 30-40 kg/m2 and chronic HTN  Postpartum VTE prophylaxis with LMWH not indicated  After visit meds:  Allergies  as of 05/23/2023       Reactions   Zithromax [azithromycin]    Respiratory Destress        Medication List     STOP taking these medications    aspirin 81 MG chewable tablet   labetalol 100 MG tablet Commonly known as: NORMODYNE   magnesium oxide 400 (240 Mg) MG tablet Commonly known as: MAG-OX   prenatal multivitamin Tabs tablet       TAKE these medications    acetaminophen 325 MG tablet Commonly known as: Tylenol Take 2 tablets (650 mg total) by mouth every 4 (four) hours as needed (for pain scale < 4). What changed:  medication strength how much to take when to take this reasons to take this   benzocaine-Menthol 20-0.5 % Aero Commonly known as: DERMOPLAST Apply 1 Application topically as needed for irritation (perineal discomfort).   coconut oil Oil Apply 1 Application topically as needed.   dibucaine 1 % Oint Commonly known as: NUPERCAINAL Place 1 Application rectally as needed for hemorrhoids.   diphenhydrAMINE 50 MG capsule Commonly known as: BENADRYL Take 1 capsule (50 mg total) by mouth every 8 (eight) hours as needed for allergies (rash, itching).   ferrous sulfate 325 (65 FE) MG tablet Take 1 tablet (325 mg total) by mouth 2 (two) times daily with a meal.   furosemide 20 MG tablet Commonly known as: LASIX Take 1 tablet (20 mg total) by mouth daily for 4 doses.   ibuprofen 600 MG tablet Commonly known as: ADVIL Take 1 tablet (600 mg total) by mouth every 6 (six) hours.   loratadine 10 MG tablet Commonly known as: CLARITIN Take 1 tablet (10 mg total) by mouth at bedtime.    NIFEdipine 90 MG 24 hr tablet Commonly known as: PROCARDIA XL/NIFEDICAL-XL Take 1 tablet (90 mg total) by mouth daily. Start taking on: May 24, 2023   predniSONE 10 MG tablet Commonly known as: DELTASONE Take 3.5 tablets (35 mg total) by mouth daily with breakfast for 1 day, THEN 3 tablets (30 mg total) daily with breakfast for 1 day, THEN 2.5 tablets (25 mg total) daily with breakfast for 1 day, THEN 2 tablets (20 mg total) daily with breakfast for 1 day, THEN 1.5 tablets (15 mg total) daily with breakfast for 1 day, THEN 1 tablet (10 mg total) daily with breakfast for 1 day. Start taking on: May 22, 2023   propranolol 20 MG tablet Commonly known as: INDERAL Take 1-3 tablets (20-60 mg total) by mouth daily. Start with 20mg  daily. If BP still >135/85, take an additional 20mg  daily, up to 60mg  daily   senna-docusate 8.6-50 MG tablet Commonly known as: Senokot-S Take 2 tablets by mouth daily.   simethicone 80 MG chewable tablet Commonly known as: MYLICON Chew 1 tablet (80 mg total) by mouth as needed for flatulence.   witch hazel-glycerin pad Commonly known as: TUCKS Apply 1 Application topically as needed for hemorrhoids.       Discharge home in stable condition Infant Feeding: Bottle and Breast Infant Disposition:home with mother Discharge instruction: per After Visit Summary and Postpartum booklet. Activity: Advance as tolerated. Pelvic rest for 6 weeks.  Diet: routine diet Anticipated Birth Control: IUD Postpartum Appointment:2-3 days Additional Postpartum F/U: Postpartum Depression checkup Future Appointments:No future appointments. Follow up Visit:  Follow-up Information     Haroldine Laws, CNM. Schedule an appointment as soon as possible for a visit in 6 week(s).   Specialty: Certified Nurse Midwife Why: pp  visit  and Mirena IUD Contact information: [address] [place] [address] [phone]         Haroldine Laws, CNM Follow up in 2  week(s).   Specialty: Certified Nurse Midwife Why: mood check Contact information: [address] [place] [address] [phone]         [facility] Follow up in 2 day(s).   Why: blood pressure check Contact information: [address] [place] [address] [phone]                Plan:  Brenda Osborn was discharged to home in good condition. Follow-up appointment as directed.    Signed:  Janyce Llanos, CNM 05/23/2023 1:38 PM

## 2023-05-23 NOTE — Telephone Encounter (Signed)
[facility]- Discharge Call Backs-Left Voicemail about the following below or Spoke to patient on the phone about the following below 1-Do you have any questions or concerns about yourself as you heal?Pt still in the hospital because of elevated BP's. 2-Any concerns or questions about your baby?No Is your baby eating, peeing,pooping well? Yes 3-How was your stay at the hospital?Good 5- Did our team work together to care for you?Yes You should be receiving a survey in the mail soon.   We would really appreciate it if you could fill that out for [place] and return it in the mail.  We value the feedback to make improvements and continue the great work we do.   If you have any questions please feel free to call me back at [phone]

## 2023-05-23 NOTE — Progress Notes (Addendum)
Patient's BP >160/110 x2 within apart. Patient denies symptoms (headache, vision changes, epigastric pain, new swelling). Margaretmary Eddy CNM notified via phone x2 immediately but CNM/Attending MD in another room per charge RN. RN went into patient's room to restart IV to follow IV labetalol protocol to manage severe range blood pressures, but patient had went into the shower. Patient's support person stated that patient was frustrated but would be willing to discuss plan of care. RN reviewed importance of treating BP, but patient did not want a new PIV and did not want to go down the IV labetalol treatment protocol. CNM called back 10 mins later to discuss patient's BP with new orders placed for PO nifedipine pathway. RN called pharmacy for medication to be sent up. Medication received around 0530 and quickly administered. Per pathway, rechecking BP frequently for the next 2 hours until BP has stabilized under treatment parameters. CNM placed orders to notify provider if pathway has been exhausted and BP still in severe range.

## 2023-05-23 NOTE — Progress Notes (Signed)
Post Partum Day 4 Subjective: Doing well, no complaints.  Tolerating regular diet, pain with PO meds, voiding and ambulating without difficulty. She reports last night she developed the rash on her arms and chest again with mild swelling in her lips. She denies any SOB.  No CP SOB Fever,Chills, N/V or leg pain; denies nipple or breast pain, no HA change of vision, RUQ/epigastric pain  Objective: BP 127/80 (BP Location: Left Arm)   Pulse 86   Temp 98.8 F (37.1 C) (Oral)   Resp 17   Ht 5\' 8"  (1.727 m)   Wt 117 kg Comment: 258lbs  LMP 08/22/2022 (Approximate)   SpO2 99%   Breastfeeding Unknown   BMI 39.23 kg/m    Vitals:   05/22/23 1314 05/22/23 1430 05/22/23 1559 05/22/23 1601  BP: (!) 154/102 (!) 156/101 (!) 152/92 (!) 152/92   05/22/23 2108 05/22/23 2351 05/23/23 0445 05/23/23 0457  BP: (!) 147/92 (!) 144/99 (!) 171/108 (!) 161/102   05/23/23 0550 05/23/23 0610 05/23/23 0630 05/23/23 0805  BP: (!) 150/101 (!) 110/59 126/75 127/80    Physical Exam:  General: NAD Breasts: soft/nontender CV: RRR Pulm: nl effort, CTABL Abdomen: soft, NT, BS x 4 Perineum: minimal edema, intact Lochia: moderate Uterine Fundus: fundus firm and 1 fb below umbilicus DVT Evaluation: no cords, ttp LEs   Recent Labs    05/21/23 0644 05/22/23 1508  HGB 11.0* 11.2*  HCT 32.5* 33.2*  WBC 9.4 10.2  PLT 242 270    Assessment/Plan: 27 y.o. [id number] postpartum day # 1  - Continue routine PP care - Lactation consult PRN - Discussed contraceptive options including implant, IUDs hormonal and non-hormonal, injection, pills/ring/patch, condoms, and NFP.  - Acute blood loss anemia, clinically insignificant - hemoglobin changed from 10.9 to 11.2, patient is asymptomatic, hemodynamically stable; continue po ferrous sulfate BID with stool softeners - Immunization status: all Imms up to date - Chronic HTN: As of last night/this morning, was receiving Labetalol 800mg  BID and Procardia 30mg  XL. She had  severe range blood pressures last at 5am this morning and received oral immediate release Nifedipine. BP has been WNL since then. This morning she reports that she believes she is allergic to Labetalol, when she took the labetalol last night her rash returned. Discussed with Dr. Jean Rosenthal and pharmacy. Per pharmacy, she has been taking Labetalol for 18 days, so it would be unusual for her to develop an allergy at this point. Will hold the Labetalol and increase Procardia to 90mg  XL with immediate release Procardia available for severe range blood pressures.  Disposition: Desires DC home today, aware needs good BP control for d/c.  Janyce Llanos, CNM 05/23/2023 9:23 AM

## 2023-06-01 DIAGNOSIS — Z419 Encounter for procedure for purposes other than remedying health state, unspecified: Secondary | ICD-10-CM | POA: Diagnosis not present

## 2023-06-09 ENCOUNTER — Inpatient Hospital Stay: Admit: 2023-06-09 | Payer: Self-pay

## 2023-07-01 DIAGNOSIS — Z419 Encounter for procedure for purposes other than remedying health state, unspecified: Secondary | ICD-10-CM | POA: Diagnosis not present

## 2023-07-28 DIAGNOSIS — R350 Frequency of micturition: Secondary | ICD-10-CM | POA: Diagnosis not present

## 2023-07-28 DIAGNOSIS — R300 Dysuria: Secondary | ICD-10-CM | POA: Diagnosis not present

## 2023-08-01 DIAGNOSIS — Z419 Encounter for procedure for purposes other than remedying health state, unspecified: Secondary | ICD-10-CM | POA: Diagnosis not present

## 2023-08-05 DIAGNOSIS — Z3202 Encounter for pregnancy test, result negative: Secondary | ICD-10-CM | POA: Diagnosis not present

## 2023-08-05 DIAGNOSIS — Z30430 Encounter for insertion of intrauterine contraceptive device: Secondary | ICD-10-CM | POA: Diagnosis not present

## 2023-08-31 DIAGNOSIS — S61213A Laceration without foreign body of left middle finger without damage to nail, initial encounter: Secondary | ICD-10-CM | POA: Diagnosis not present

## 2023-08-31 DIAGNOSIS — Z419 Encounter for procedure for purposes other than remedying health state, unspecified: Secondary | ICD-10-CM | POA: Diagnosis not present

## 2023-10-01 DIAGNOSIS — Z419 Encounter for procedure for purposes other than remedying health state, unspecified: Secondary | ICD-10-CM | POA: Diagnosis not present

## 2023-10-04 DIAGNOSIS — B9689 Other specified bacterial agents as the cause of diseases classified elsewhere: Secondary | ICD-10-CM | POA: Diagnosis not present

## 2023-10-04 DIAGNOSIS — J019 Acute sinusitis, unspecified: Secondary | ICD-10-CM | POA: Diagnosis not present

## 2023-10-04 DIAGNOSIS — Z03818 Encounter for observation for suspected exposure to other biological agents ruled out: Secondary | ICD-10-CM | POA: Diagnosis not present

## 2023-10-10 DIAGNOSIS — J40 Bronchitis, not specified as acute or chronic: Secondary | ICD-10-CM | POA: Diagnosis not present

## 2023-10-12 DIAGNOSIS — Z419 Encounter for procedure for purposes other than remedying health state, unspecified: Secondary | ICD-10-CM | POA: Diagnosis not present

## 2023-11-01 DIAGNOSIS — Z419 Encounter for procedure for purposes other than remedying health state, unspecified: Secondary | ICD-10-CM | POA: Diagnosis not present

## 2023-11-03 DIAGNOSIS — O10919 Unspecified pre-existing hypertension complicating pregnancy, unspecified trimester: Secondary | ICD-10-CM | POA: Diagnosis not present

## 2023-11-03 DIAGNOSIS — R002 Palpitations: Secondary | ICD-10-CM | POA: Diagnosis not present

## 2023-11-03 DIAGNOSIS — F411 Generalized anxiety disorder: Secondary | ICD-10-CM | POA: Diagnosis not present

## 2023-11-12 DIAGNOSIS — Z419 Encounter for procedure for purposes other than remedying health state, unspecified: Secondary | ICD-10-CM | POA: Diagnosis not present

## 2023-11-23 DIAGNOSIS — R399 Unspecified symptoms and signs involving the genitourinary system: Secondary | ICD-10-CM | POA: Diagnosis not present

## 2023-11-23 DIAGNOSIS — U071 COVID-19: Secondary | ICD-10-CM | POA: Diagnosis not present

## 2023-11-23 DIAGNOSIS — Z03818 Encounter for observation for suspected exposure to other biological agents ruled out: Secondary | ICD-10-CM | POA: Diagnosis not present

## 2023-11-23 DIAGNOSIS — N390 Urinary tract infection, site not specified: Secondary | ICD-10-CM | POA: Diagnosis not present

## 2023-11-27 DIAGNOSIS — G43719 Chronic migraine without aura, intractable, without status migrainosus: Secondary | ICD-10-CM | POA: Diagnosis not present

## 2023-11-27 DIAGNOSIS — G4733 Obstructive sleep apnea (adult) (pediatric): Secondary | ICD-10-CM | POA: Diagnosis not present

## 2023-11-29 DIAGNOSIS — Z419 Encounter for procedure for purposes other than remedying health state, unspecified: Secondary | ICD-10-CM | POA: Diagnosis not present

## 2023-12-02 DIAGNOSIS — F3342 Major depressive disorder, recurrent, in full remission: Secondary | ICD-10-CM | POA: Diagnosis not present

## 2023-12-02 DIAGNOSIS — N760 Acute vaginitis: Secondary | ICD-10-CM | POA: Diagnosis not present

## 2023-12-02 DIAGNOSIS — F411 Generalized anxiety disorder: Secondary | ICD-10-CM | POA: Diagnosis not present

## 2023-12-02 DIAGNOSIS — E66811 Obesity, class 1: Secondary | ICD-10-CM | POA: Diagnosis not present

## 2024-01-10 DIAGNOSIS — Z419 Encounter for procedure for purposes other than remedying health state, unspecified: Secondary | ICD-10-CM | POA: Diagnosis not present

## 2024-01-13 DIAGNOSIS — J019 Acute sinusitis, unspecified: Secondary | ICD-10-CM | POA: Diagnosis not present

## 2024-01-14 DIAGNOSIS — Z30433 Encounter for removal and reinsertion of intrauterine contraceptive device: Secondary | ICD-10-CM | POA: Diagnosis not present

## 2024-02-03 DIAGNOSIS — R5383 Other fatigue: Secondary | ICD-10-CM | POA: Diagnosis not present

## 2024-02-03 DIAGNOSIS — R42 Dizziness and giddiness: Secondary | ICD-10-CM | POA: Diagnosis not present

## 2024-02-03 DIAGNOSIS — R002 Palpitations: Secondary | ICD-10-CM | POA: Diagnosis not present

## 2024-02-03 DIAGNOSIS — E538 Deficiency of other specified B group vitamins: Secondary | ICD-10-CM | POA: Diagnosis not present

## 2024-02-09 DIAGNOSIS — Z419 Encounter for procedure for purposes other than remedying health state, unspecified: Secondary | ICD-10-CM | POA: Diagnosis not present

## 2024-02-09 DIAGNOSIS — R7989 Other specified abnormal findings of blood chemistry: Secondary | ICD-10-CM | POA: Diagnosis not present

## 2024-02-18 DIAGNOSIS — Z30431 Encounter for routine checking of intrauterine contraceptive device: Secondary | ICD-10-CM | POA: Diagnosis not present

## 2024-03-02 DIAGNOSIS — R002 Palpitations: Secondary | ICD-10-CM | POA: Diagnosis not present

## 2024-03-02 DIAGNOSIS — I10 Essential (primary) hypertension: Secondary | ICD-10-CM | POA: Diagnosis not present

## 2024-03-11 DIAGNOSIS — T8332XA Displacement of intrauterine contraceptive device, initial encounter: Secondary | ICD-10-CM | POA: Diagnosis not present

## 2024-03-11 DIAGNOSIS — Z30430 Encounter for insertion of intrauterine contraceptive device: Secondary | ICD-10-CM | POA: Diagnosis not present

## 2024-03-11 DIAGNOSIS — Z30432 Encounter for removal of intrauterine contraceptive device: Secondary | ICD-10-CM | POA: Diagnosis not present

## 2024-03-11 DIAGNOSIS — Z419 Encounter for procedure for purposes other than remedying health state, unspecified: Secondary | ICD-10-CM | POA: Diagnosis not present

## 2024-04-10 DIAGNOSIS — Z419 Encounter for procedure for purposes other than remedying health state, unspecified: Secondary | ICD-10-CM | POA: Diagnosis not present

## 2024-05-11 DIAGNOSIS — Z419 Encounter for procedure for purposes other than remedying health state, unspecified: Secondary | ICD-10-CM | POA: Diagnosis not present
